# Patient Record
Sex: Female | Born: 1935 | Race: White | Hispanic: No | Smoking: Never smoker
Health system: Southern US, Community
[De-identification: ages and names within clinical notes are randomized; demographics above are authoritative.]

## PROBLEM LIST (undated history)

## (undated) DIAGNOSIS — I10 Essential (primary) hypertension: Secondary | ICD-10-CM

## (undated) DIAGNOSIS — C50919 Malignant neoplasm of unspecified site of unspecified female breast: Secondary | ICD-10-CM

## (undated) DIAGNOSIS — E785 Hyperlipidemia, unspecified: Secondary | ICD-10-CM

## (undated) DIAGNOSIS — E039 Hypothyroidism, unspecified: Secondary | ICD-10-CM

## (undated) DIAGNOSIS — C8590 Non-Hodgkin lymphoma, unspecified, unspecified site: Secondary | ICD-10-CM

## (undated) DIAGNOSIS — E119 Type 2 diabetes mellitus without complications: Secondary | ICD-10-CM

## (undated) DIAGNOSIS — K219 Gastro-esophageal reflux disease without esophagitis: Secondary | ICD-10-CM

## (undated) HISTORY — DX: Hypothyroidism, unspecified: E03.9

## (undated) HISTORY — PX: APPENDECTOMY: SHX54

## (undated) HISTORY — PX: CHOLECYSTECTOMY: SHX55

## (undated) HISTORY — DX: Essential (primary) hypertension: I10

## (undated) HISTORY — DX: Non-Hodgkin lymphoma, unspecified, unspecified site: C85.90

## (undated) HISTORY — PX: MASTECTOMY: SHX3

## (undated) HISTORY — DX: Type 2 diabetes mellitus without complications: E11.9

## (undated) HISTORY — DX: Malignant neoplasm of unspecified site of unspecified female breast: C50.919

## (undated) HISTORY — DX: Hyperlipidemia, unspecified: E78.5

## (undated) HISTORY — DX: Gastro-esophageal reflux disease without esophagitis: K21.9

## (undated) HISTORY — PX: TONSILLECTOMY: SUR1361

---

## 1998-03-14 ENCOUNTER — Other Ambulatory Visit: Admission: RE | Admit: 1998-03-14 | Discharge: 1998-03-14 | Payer: Self-pay | Admitting: *Deleted

## 2000-11-08 LAB — HM COLONOSCOPY

## 2001-05-17 ENCOUNTER — Other Ambulatory Visit: Admission: RE | Admit: 2001-05-17 | Discharge: 2001-05-17 | Payer: Self-pay | Admitting: *Deleted

## 2001-06-20 ENCOUNTER — Other Ambulatory Visit: Admission: RE | Admit: 2001-06-20 | Discharge: 2001-06-20 | Payer: Self-pay | Admitting: Internal Medicine

## 2001-06-20 ENCOUNTER — Encounter (INDEPENDENT_AMBULATORY_CARE_PROVIDER_SITE_OTHER): Payer: Self-pay | Admitting: Specialist

## 2002-07-17 ENCOUNTER — Encounter: Payer: Self-pay | Admitting: Urology

## 2002-07-18 ENCOUNTER — Inpatient Hospital Stay (HOSPITAL_COMMUNITY): Admission: EM | Admit: 2002-07-18 | Discharge: 2002-07-19 | Payer: Self-pay | Admitting: Urology

## 2002-11-26 ENCOUNTER — Ambulatory Visit (HOSPITAL_COMMUNITY): Admission: RE | Admit: 2002-11-26 | Discharge: 2002-11-26 | Payer: Self-pay | Admitting: Oncology

## 2002-11-26 ENCOUNTER — Encounter (HOSPITAL_COMMUNITY): Payer: Self-pay | Admitting: Oncology

## 2003-03-14 ENCOUNTER — Ambulatory Visit (HOSPITAL_COMMUNITY): Admission: RE | Admit: 2003-03-14 | Discharge: 2003-03-14 | Payer: Self-pay | Admitting: Ophthalmology

## 2005-12-07 ENCOUNTER — Ambulatory Visit (HOSPITAL_COMMUNITY): Admission: RE | Admit: 2005-12-07 | Discharge: 2005-12-07 | Payer: Self-pay | Admitting: Internal Medicine

## 2006-04-14 ENCOUNTER — Ambulatory Visit: Payer: Self-pay

## 2006-04-21 ENCOUNTER — Ambulatory Visit (HOSPITAL_COMMUNITY): Admission: RE | Admit: 2006-04-21 | Discharge: 2006-04-21 | Payer: Self-pay | Admitting: Internal Medicine

## 2006-04-29 ENCOUNTER — Ambulatory Visit (HOSPITAL_COMMUNITY): Admission: RE | Admit: 2006-04-29 | Discharge: 2006-04-29 | Payer: Self-pay | Admitting: Internal Medicine

## 2006-10-17 ENCOUNTER — Ambulatory Visit (HOSPITAL_COMMUNITY): Admission: RE | Admit: 2006-10-17 | Discharge: 2006-10-17 | Payer: Self-pay | Admitting: Internal Medicine

## 2007-02-23 ENCOUNTER — Inpatient Hospital Stay (HOSPITAL_COMMUNITY): Admission: EM | Admit: 2007-02-23 | Discharge: 2007-02-26 | Payer: Self-pay | Admitting: Emergency Medicine

## 2007-02-23 ENCOUNTER — Encounter (INDEPENDENT_AMBULATORY_CARE_PROVIDER_SITE_OTHER): Payer: Self-pay | Admitting: *Deleted

## 2007-04-28 ENCOUNTER — Ambulatory Visit (HOSPITAL_COMMUNITY): Admission: RE | Admit: 2007-04-28 | Discharge: 2007-04-28 | Payer: Self-pay | Admitting: Internal Medicine

## 2008-07-19 ENCOUNTER — Ambulatory Visit (HOSPITAL_COMMUNITY): Admission: RE | Admit: 2008-07-19 | Discharge: 2008-07-19 | Payer: Self-pay | Admitting: Internal Medicine

## 2009-10-14 ENCOUNTER — Ambulatory Visit: Payer: Self-pay | Admitting: Cardiology

## 2009-10-14 ENCOUNTER — Inpatient Hospital Stay (HOSPITAL_COMMUNITY): Admission: EM | Admit: 2009-10-14 | Discharge: 2009-10-17 | Payer: Self-pay | Admitting: Emergency Medicine

## 2009-10-15 ENCOUNTER — Ambulatory Visit: Payer: Self-pay | Admitting: Vascular Surgery

## 2009-10-15 ENCOUNTER — Encounter (INDEPENDENT_AMBULATORY_CARE_PROVIDER_SITE_OTHER): Payer: Self-pay | Admitting: Internal Medicine

## 2009-11-08 LAB — HM MAMMOGRAPHY: HM Mammogram: NEGATIVE

## 2010-08-18 ENCOUNTER — Telehealth (INDEPENDENT_AMBULATORY_CARE_PROVIDER_SITE_OTHER): Payer: Self-pay | Admitting: *Deleted

## 2010-08-20 ENCOUNTER — Telehealth (INDEPENDENT_AMBULATORY_CARE_PROVIDER_SITE_OTHER): Payer: Self-pay | Admitting: *Deleted

## 2010-08-24 ENCOUNTER — Encounter: Payer: Self-pay | Admitting: Cardiology

## 2010-08-24 ENCOUNTER — Ambulatory Visit: Payer: Self-pay

## 2010-12-08 NOTE — Progress Notes (Signed)
Summary: 24 hour holter monitor  Phone Note Outgoing Call Call back at [facility] Phone [phone]   Call placed by: Stanton Kidney, EMT-P,  August 20, 2010 4:52 PM Summary of Call: No answer at home number- need to schedule 24 hour holter monitor. Stanton Kidney, EMT-P  August 18, 2010 5:15 PM

## 2010-12-08 NOTE — Progress Notes (Signed)
Summary: 24 holter monitor  Phone Note Outgoing Call Call back at [facility] Phone [phone]   Call placed by: Stanton Kidney, EMT-P,  August 20, 2010 5:01 PM Action Taken: Phone Call Completed Summary of Call: Pt scheduled for 10/17 at 9am. Stanton Kidney, EMT-P  August 20, 2010 5:01 PM

## 2010-12-08 NOTE — Procedures (Signed)
Summary: summary report  summary report   Imported By: Mirna Mires 08/26/2010 15:26:32  _____________________________________________________________________  External Attachment:    Type:   Image     Comment:   External Document

## 2011-02-09 LAB — DIFFERENTIAL
Basophils Absolute: 0.1 10*3/uL (ref 0.0–0.1)
Basophils Relative: 0 % (ref 0–1)
Eosinophils Absolute: 0 10*3/uL (ref 0.0–0.7)
Eosinophils Absolute: 0.1 10*3/uL (ref 0.0–0.7)
Eosinophils Absolute: 0.2 10*3/uL (ref 0.0–0.7)
Eosinophils Relative: 0 % (ref 0–5)
Eosinophils Relative: 2 % (ref 0–5)
Lymphocytes Relative: 19 % (ref 12–46)
Lymphs Abs: 1 10*3/uL (ref 0.7–4.0)
Monocytes Absolute: 0.4 10*3/uL (ref 0.1–1.0)
Monocytes Absolute: 0.9 10*3/uL (ref 0.1–1.0)
Monocytes Relative: 4 % (ref 3–12)
Monocytes Relative: 8 % (ref 3–12)
Neutro Abs: 10 10*3/uL — ABNORMAL HIGH (ref 1.7–7.7)
Neutro Abs: 8.4 10*3/uL — ABNORMAL HIGH (ref 1.7–7.7)
Neutrophils Relative %: 70 % (ref 43–77)

## 2011-02-09 LAB — BASIC METABOLIC PANEL
BUN: 10 mg/dL (ref 6–23)
CO2: 24 mEq/L (ref 19–32)
Chloride: 104 mEq/L (ref 96–112)
Creatinine, Ser: 1 mg/dL (ref 0.4–1.2)
GFR calc Af Amer: >60 mL/min (ref >60)
GFR calc non Af Amer: 54 mL/min — ABNORMAL LOW (ref >60)
Glucose, Bld: 112 mg/dL — ABNORMAL HIGH (ref 70–99)
Potassium: 3.2 mEq/L — ABNORMAL LOW (ref 3.5–5.1)
Potassium: 3.8 mEq/L (ref 3.5–5.1)
Sodium: 137 mEq/L (ref 135–145)

## 2011-02-09 LAB — CBC
HCT: 34.4 % — ABNORMAL LOW (ref 36.0–46.0)
HCT: 37.8 % (ref 36.0–46.0)
Hemoglobin: 12.3 g/dL (ref 12.0–15.0)
Hemoglobin: 12.7 g/dL (ref 12.0–15.0)
MCHC: 33.7 g/dL (ref 30.0–36.0)
MCV: 88.6 fL (ref 78.0–100.0)
MCV: 88.9 fL (ref 78.0–100.0)
Platelets: 357 10*3/uL (ref 150–400)
RBC: 4.1 MIL/uL (ref 3.87–5.11)
RBC: 4.25 MIL/uL (ref 3.87–5.11)
RDW: 13.5 % (ref 11.5–15.5)
WBC: 9.9 10*3/uL (ref 4.0–10.5)

## 2011-02-09 LAB — TSH: TSH: 17.72 u[IU]/mL — ABNORMAL HIGH (ref 0.350–4.500)

## 2011-02-09 LAB — CARDIAC PANEL(CRET KIN+CKTOT+MB+TROPI)
Relative Index: INVALID (ref 0.0–2.5)
Relative Index: INVALID (ref 0.0–2.5)
Troponin I: 0.01 ng/mL (ref 0.00–0.06)
Troponin I: 0.02 ng/mL (ref 0.00–0.06)

## 2011-02-09 LAB — COMPREHENSIVE METABOLIC PANEL
Alkaline Phosphatase: 47 U/L (ref 39–117)
BUN: 14 mg/dL (ref 6–23)
CO2: 27 mEq/L (ref 19–32)
Chloride: 104 mEq/L (ref 96–112)
Creatinine, Ser: 1.23 mg/dL — ABNORMAL HIGH (ref 0.4–1.2)
GFR calc non Af Amer: 42 mL/min — ABNORMAL LOW (ref >60)
Glucose, Bld: 124 mg/dL — ABNORMAL HIGH (ref 70–99)
Total Bilirubin: 0.5 mg/dL (ref 0.3–1.2)

## 2011-02-09 LAB — URINE CULTURE
Colony Count: >=100000
Special Requests: NEGATIVE

## 2011-02-09 LAB — URINALYSIS, ROUTINE W REFLEX MICROSCOPIC
Bilirubin Urine: NEGATIVE
Glucose, UA: NEGATIVE mg/dL
Hgb urine dipstick: NEGATIVE
Protein, ur: NEGATIVE mg/dL

## 2011-02-09 LAB — POCT CARDIAC MARKERS
CKMB, poc: 1.1 ng/mL (ref 1.0–8.0)
Myoglobin, poc: 112 ng/mL (ref 12–200)

## 2011-02-09 LAB — LIPID PANEL
HDL: 37 mg/dL — ABNORMAL LOW (ref >39)
Total CHOL/HDL Ratio: 4.7 RATIO

## 2011-02-09 LAB — URINE MICROSCOPIC-ADD ON

## 2011-03-26 NOTE — Op Note (Signed)
TNAME:  Janet Reeves, Janet Reeves                           ACCOUNT NO.:  [id number]   MEDICAL RECORD NO.:  [id number]                   PATIENT TYPE:  AMB   LOCATION:  [facility]                                 FACILITY:  [facility]   PHYSICIAN:  Jamison Neighbor, M.D.               DATE OF BIRTH:  02/22/1929   DATE OF PROCEDURE:  07/17/2002  DATE OF DISCHARGE:                                 OPERATIVE REPORT   PREOPERATIVE DIAGNOSES:  1. Cystocele.  2. Rectocele.  3. Stress urinary incontinence.   POSTOPERATIVE DIAGNOSES:  1. Cystocele.  2. Rectocele.  3. Stress urinary incontinence.   PROCEDURE:  1. Cystoscopy.  2. Suprapubic tube placement.  3. Anterior repair.  4. Placement of pubovaginal sling.  5. Posterior repair.   SURGEON:  Jamison Neighbor, M.D.   ASSISTANT:  Crecencio Mc, M.D.   ANESTHESIA:  General.   COMPLICATIONS:  None.   DRAINS:  A Bonanno Cystocath.   BRIEF HISTORY:  This 77 year old female has a symptomatic cystocele.  She is  bothered by the heavy sensation of prolapse.  Because of the prolapsing  cystocele, she has not had as much in the way of stress incontinence but  does have urethral hypermobility.  There is some kinking at the bladder neck  caused by the cystocele.  There is also a small rectocele which may or may  not require repair.  The patient still has a uterus intact.  It is small,  atrophic, and well-supported, and she does not wish to have this removed  unless there is a concomitant reason for hysterectomy.  The patient has  agreed to a cystocele repair, placement of a sling to further correct her  central and lateral defect, and rectocele repair if indicated.  She gave  informed consent.  She understands that she will need a suprapubic tube as  well as 23-hour observation.   DESCRIPTION OF PROCEDURE:  After the successful induction of general  anesthesia, the patient was placed in the dorsal lithotomy position, prepped  with Betadine, and draped in the  usual sterile fashion.  The patient's  vagina was carefully inspected.  No masses could be seen.  The uterus was  palpably normal and in normal position.  The patient had a modest rectocele  and a larger cystocele.  The patient had a weighted vaginal speculum placed.  The anterior vaginal mucosa was infiltrated with local anesthetic.  While  that was setting up, a small incision was made directly above the pubic  bone, and this was carried down sharply through the subcutaneous fascia  until the rectus sheath and the pubic bone could be identified.  Antibiotic-  soaked gauze was placed in that small incision.  Anterior vaginal mucosal  incision was then made, beginning at the bladder neck and extending all the  way back to the cardinal ligaments.  The posterior dissection proceeded all  the  way back to the space of Retzius which was entered, and the urethra was  mobilized.  The entire cystocele was freed from the gouging mucosa.  The  cystocele was plicated with a series of horizontal mattress sutures,  beginning up near the bladder neck and extending all the way back to the  cardinal ligaments which were identified and used for support.  The sling  was then constructed of a 4 x 7 cm piece of Tutoplast fascia.  This was  constructed into a T shape with the bottom limit of the T extending down  along the cystocele repair.  The ends of the transverse portion of the T  were oversewn with #1 nylon.  Tonsil clamps were passed from the abdominal  incision to the vaginal incision and were used to pull the sutures for the  sling up to the abdominal incision.  The sling was then positioned so that  it covered the bladder neck and extended all the way back to the cardinal  ligaments.  The sling was tacked down in multiple places with Vicryl  sutures, beginning at the bladder neck and extending down laterally and all  the way back to the cardinal ligaments.  The anterior vaginal mucosa was  then  trimmed and closed with a running suture of 2-0 Vicryl.  Cystoscopy was  performed, and the bladder was carefully inspected.  It was free of any  tumor or stones.  Both ureteral orifices were normal in configuration and  location.  No tumors or stones could be identified.  The bladder was filled  under direct vision.  A Cystocath was placed through a small stab incision,  and this was sutured in place with 3-0 nylon sutures.  The bladder was  filled.  With a full bladder and Crede maneuver, there was good flow of  urine.  The sling was tied down with an appropriate degree of tension.  The  following steps were used to ensure that there was no overcorrection:  (1)  The sling was tied down so there were at least 2 fingerbreadths between the  sling and the fascia.  (2) The cystoscope could be inserted at a straight  angle, and there was at least 30-45 degrees of play within the cystoscope  with no excessive angulation.  (3) Coaptation in the mucosa could be seen  but no excessive correction.  (4) With a full bladder, there was no loss of  urine but with Crede, there was a straight, prompt flow of urine with no  sense of obstruction.  The sling was tied down completely.  The incision was  irrigated with antibiotic solution.  It was then closed with 2-0 Vicryl, 4-0  Vicryl subcuticular, Steri-Strips, and a Tegaderm.  Attention was then  directed posteriorly.  Once the cystocele had been elevated, it was clear  that there was a good sized rectocele.  This did not extend all the way down  to the perineum where the perineal body appeared to be reasonably intact.  It was not felt that a perineorrhaphy was identified.  A simple site-  specific rectocele repair was undertaken.  The anterior vaginal mucosa was  infiltrated and then was opened in the midline, beginning just inside the  introitus and extending all the way back towards the uterus.  Flaps were raised bilaterally, extending all the way down  to the fascia, and the fascia  was then closed in the midline with a series of interrupted figure-of-eight  sutures.  The mucosa was then trimmed slightly and was closed with a running  suture of 2-0 Vicryl.  At the end of the procedure, the cystocele and  rectocele were seen to have been nicely reduced.  Rectal examination with a  fresh glove showed that there was good support for the rectum.  The vagina  does narrow down somewhat posterior, but there is plenty of length in case  the patient remains sexually active.  The uterus is unremarkable and can be  left alone.  The patient had vaginal pack applied.  The suprapubic tube was  placed to straight drainage.  The patient tolerated the procedure well and  was taken to the recovery room in good condition.  She will admitted for 23-  hour observation and will be sent home tomorrow with training with the  suprapubic tube.  She will be sent home with Cipro and Lorcet Plus.  She  will return to my office in one week's time for removal of the SP tube,  assuming that she is voiding satisfactorily.                                               Jamison Neighbor, M.D.    RJE/MEDQ  D:  07/17/2002  T:  07/17/2002  Job:  [id number]   cc:   Marinus Maw, M.D.

## 2011-03-26 NOTE — Op Note (Signed)
NAME:  Janet Reeves, Janet Reeves                            ACCOUNT NO.:  [id number]   MEDICAL RECORD NO.:  [id number]                   PATIENT TYPE:  OIB   LOCATION:  2899                                 FACILITY:  [facility]   PHYSICIAN:  Robert Reeves. Dione Booze, M.D.               DATE OF BIRTH:  Oct 20, 1929   DATE OF PROCEDURE:  03/14/2003  DATE OF DISCHARGE:  03/14/2003                                 OPERATIVE REPORT   INDICATIONS AND JUSTIFICATION FOR THE PROCEDURE:  Janet Reeves was seen  recently in my office on February 12, 2003 with 20/25 vision and previous  cataract surgery with lens implants.  Pressures were 9 and her vision was  good, but she did notice that superior field vision was reduced and she  complained that her eyelids drooped over and this was a longstanding problem  but was getting worse.  She could feel the weight of the lids and when she  lifted them up, she felt she could see better.  The problem was confirmed  with visual field testing and photographs, and indeed she does have a  significant loss of vision when the skin of the lid is taped compared to  when it is not taped.  The margin reflex distance is 2 mm on each side.  The  pupils, motility, conjunctivae, cornea, anterior chamber, and fundus exam  were performed, and she does have lens implants in place.  Externally she  has redundant skin that covers the lid margins and comes close to the upper  margin of each pupil.  Actually when the lids were relaxed, the margin  reflex distance was 0 in some of the photographs.  The problem was discussed  and it was decided to go ahead and have upper eyelid blepharoplasties in  order to alleviate the problem that she was having with the skin.  Medically, she should be stable for this.  Justification for the procedure in the outpatient setting is routine  justification.  Overnight stay - none.   PREOPERATIVE DIAGNOSIS:  Moderate dermatochalasis with redundant skin of  each upper eyelid.   POSTOPERATIVE DIAGNOSIS:  Moderate dermatochalasis with redundant skin of  each upper eyelid.   OPERATION/PROCEDURE:  Upper eyelid blepharoplasty.   SURGEON:  Robert Reeves. Dione Booze, M.D.   ANESTHESIA:  1% Xylocaine with epinephrine.   DESCRIPTION OF PROCEDURE:  The patient arrived in the minor surgery room at  [facility]  and was prepped and draped in the routine fashion.  The  skin to be removed was carefully demarcated and excised on each side after  1% Xylocaine with epinephrine had been given.  The skin was excised along  with underlying fatty tissue and each wound was closed with a running 6-0  nylon suture.  Pressure patches were applied and the patient left the minor  room having done well.    FOLLOWUP CARE:  The  patient will be seen in my office in five or six days to  have the sutures removed.  She is to use warm compresses twice daily and  Polysporin ointment in her eyes at night.                                               Robert Reeves. Dione Booze, M.D.    RLG/MEDQ  D:  03/14/2003  T:  03/15/2003  Job:  [id number]   cc:   Lucky Cowboy, M.D.  [address]  [place], [place] [address]  Fax: [phone]

## 2011-03-26 NOTE — Discharge Summary (Signed)
Janet Reeves, Janet Reeves                  ACCOUNT NO.:  [id number]   MEDICAL RECORD NO.:  [id number]          PATIENT TYPE:  INP   LOCATION:  3730                         FACILITY:  [facility]   PHYSICIAN:  Theresia Bough, MD       DATE OF BIRTH:  10/14/1929   DATE OF ADMISSION:  02/22/2007  DATE OF DISCHARGE:  02/26/2007                               DISCHARGE SUMMARY   PRIMARY CARE PHYSICIAN:  Lucky Cowboy, M.D.   FINAL DIAGNOSES:  1. Syncope which has resolved.  2. Pyelonephritis.  The patient is currently on antibiotics.  3. Diverticulitis.  The patient is also on antibiotics.   Other past medical history includes:  1. Hypothyroidism.  2. GERD.  3. The patient has also had a history of right breast cancer in 1985.      She was treated with mastectomy and chemotherapy.  The patient was      also treated by Dr. Jon Billings for non-Hodgkin's lymphoma.   DISCHARGE MEDICATIONS:  The patient is on Synthroid 112 mcg p.o. daily.  1. Ciprofloxacin 500 mg p.o. b.i.d. for the next one week.  2. Flagyl 500 mg p.o. b.i.d. also for the next one week.   DISCHARGE CONDITION:  The patient has improved and is stable at the time  of discharge.   DISPOSITION:  The patient is going to be discharged home.   RADIOLOGY:  While in the hospital, the patient had a CT of the abdomen  and pelvis done on February 22, 2007.  CT of the abdomen findings  compatible to mild sigmoid diverticulitis.  No perforation or abscess.  MRA of the brain shows no evidence of acute ischemia.  The patient has  some mild sinus disease.  He is currently on antibiotics.  MRA of the  neck shows no occlusion or dissection.  Please refer to the full report  of the MRA of the head and neck.   Current blood work shows WBC 8.1, hemoglobin 11.7, hematocrit 34.2,  platelets of 279, sodium 141, potassium 3.5, chloride 107, bicarbonate  25, BUN 8, creatinine 0.8, and glucose 113.   BRIEF HOSPITAL COURSE:  Janet Reeves was admitted on February 23, 2007  because of a few minutes of syncope as well as abdominal pain.  CT of  the abdomen was consistent with diverticulitis.  The patient was started  on IV ciprofloxacin and IV Flagyl.  The patient continues to improve.  Urine analysis was consistent with the UTI.  This was also improved with IV ciprofloxacin.  The patient's condition  has improved at this time.  He is alert and oriented.  There is no new  medical complaints.  The patient is scheduled to be discharged home, and  she will follow up with her primary care Kaliyan Osbourn within the next one  week.      Theresia Bough, MD  Electronically Signed     GA/MEDQ  D:  02/26/2007  T:  02/26/2007  Job:  [id number]   cc:   Lucky Cowboy, M.D.

## 2011-03-26 NOTE — H&P (Signed)
NAME:  Janet Reeves, Janet Reeves                  ACCOUNT NO.:  [id number]   MEDICAL RECORD NO.:  [id number]          PATIENT TYPE:  EMS   LOCATION:  MAJO                         FACILITY:  [facility]   PHYSICIAN:  Mobolaji B. Bakare, M.D.DATE OF BIRTH:  1929-08-10   DATE OF ADMISSION:  02/22/2007  DATE OF DISCHARGE:                              HISTORY & PHYSICAL   PRIMARY CARE PHYSICIAN:  Lucky Cowboy, M.D.   CHIEF COMPLAINT:  Syncope.   HISTORY OF PRESENT ILLNESS:  Janet Reeves is a 77 year old Caucasian  female who was in her usual state of health until last night while  watching an awards ceremony for her daughter who was in [facility]  whereby she passed out for less than one minute.  The patient  had the usual greetings at the ceremony and a few sips of wine.  Sat  down at a table and all of a sudden she felt spinning sensation with the  room spinning around her and she passed out for less than one minute.  By the time she came around, she noticed someone brought some ice.  People around her said there was no convulsion or urinary incontinence.  Immediately after coming around she vomited about four times.  She is  currently complaining of headaches, but no change in her vision or  slurred speech or unilateral weakness.   The patient was brought to the emergency room.  She complained of  abdominal pain in her emergency room which was not there before she  passed out.  She had a CT scan of the abdomen which revealed minor  sigmoid diverticulitis. She also has a pyuria on urinalysis.  The  patient denies chest pain, diaphoresis or shortness of breath.   The patient has had previous episodes of passing out.  The first time  five years ago and most recently was last year while in [place], [place].  She had an extensive work-up then as per the patient's daughter's  report.  It was concluded that she was dehydrated.   REVIEW OF SYSTEMS:  Positive for dyspnea on exertion. No chest pain.   No  fever, no dysuria or increased frequency of micturition. There is no  diarrhea or constipation.  She has not vomited prior to tonight.   PAST MEDICAL HISTORY:  1. Right breast cancer in 1985.  She is status post right mastectomy      and chemotherapy.  2. She had non-Hodgkin's lymphoma in 1995, treated by Dr. Jon Billings.  3. Hypothyroidism.  4. Gastroesophageal reflux disease.   CURRENT MEDICATIONS:  Nexium, Synthroid.   ALLERGIES:  CODEINE.   FAMILY HISTORY:  Father passed away from liver cancer.  Mother passed  away from renal failure.   SOCIAL HISTORY:  The patient is a widow.  She occasionally drinks  alcohol.  She is an ex-smoker, quit 30 years ago.   PHYSICAL EXAMINATION:  VITAL SIGNS:  Temperature 98.4, blood pressure  154/63, pulse 90, respiratory rate 18, oxygen saturation 95%.  GENERAL:  On examination, the patient is awake, alert, oriented to time,  place and  person.  HEENT: Normocephalic, atraumatic.  No nystagmus.  Pupils equal, round,  and reactive to light.  Extraocular muscles intact. Mucous membranes  moist.  No oral thrush.  NECK:  No carotid bruits.  No thyromegaly.  LUNGS:  Clear.  Bibasilar rales.  No wheeze or rhonchi.  CARDIOVASCULAR:  S1 and S2 regular. Systolic ejection murmur in the  aortic area.  ABDOMEN:  Obese, soft.  There is suprapubic and left lower quadrant  tenderness.  No palpable organomegaly.  Bowel sounds present.  EXTREMITIES:  No pedal edema or calf tenderness.  Dorsalis pedis pulses  palpable bilaterally.  CNS:  No focal neurological deficit.  Motor power 5/5 in all limbs.  No  facial asymmetry.  Cranial nerves intact.   ADMISSION LABORATORY DATA:  White cell count 14,000.  Hemoglobin 13.7,  hematocrit 40.7, platelet count __________ .  Neutrophils 86%,  lymphocytes 30%.  Creatinine 1.2, sodium 127, potassium 4.2, chloride  107, glucose 146, BUN 13.  Urinalysis clear in appearance, specific  gravity 1.027.  Negative for nitrites  and small leukocytes and  microscopy showed wbc's of 21-50, rare bacteria.   Chest x-ray unavailable.  CT scan of the abdomen and pelvis showed mild  stranding about the sigmoid colon compatible with mild diverticulitis.  No perforation or abscess.  EKG is unavailable with request for one.   ASSESSMENT/PLAN:  Janet Reeves is a 77 year old independent lady who  presents with syncopal episode associated with vertigo.  She was noted  to have abdominal tenderness on initially evaluation in the emergency  room which necessitated CT scan of the abdomen.  This confirmed mild  sigmoid diverticulosis.  She also has mild pyuria.   1. Syncope.  This is associated with vertigo.  Etiology is presently      unclear.  We will admit to telemetry, cycle cardiac enzymes, obtain      2-D echocardiogram and check orthostatic blood pressure.  We will      obtain an EKG.  2. Vertigo.  We will obtain an MRI of the head, MRA head and neck to      rule out vertebrobasilar insufficiency.  3. Marked sigmoid diverticulitis.  The patient only became symptomatic      after she passed out.  Will obtain blood cultures and start her on      ciprofloxacin 400 mg q.12h., Flagyl 500 mg q.8h.  4. Probable urinary tract infection.  Urine culture has been sent.      Ciprofloxacin will cover this pending culture.  5. Hypothyroidism, will resume Synthroid.      Mobolaji B. Corky Downs, M.D.  Electronically Signed     MBB/MEDQ  D:  02/23/2007  T:  02/23/2007  Job:  [id number]   cc:   Lucky Cowboy, M.D.

## 2011-05-31 ENCOUNTER — Other Ambulatory Visit (HOSPITAL_COMMUNITY): Payer: Self-pay | Admitting: Internal Medicine

## 2011-05-31 ENCOUNTER — Ambulatory Visit (HOSPITAL_COMMUNITY)
Admission: RE | Admit: 2011-05-31 | Discharge: 2011-05-31 | Disposition: A | Discharge status: Home / Self Care | Payer: Medicare Other | Source: Ambulatory Visit | Attending: Internal Medicine | Admitting: Internal Medicine

## 2011-05-31 DIAGNOSIS — M2010 Hallux valgus (acquired), unspecified foot: Secondary | ICD-10-CM | POA: Insufficient documentation

## 2011-05-31 DIAGNOSIS — M79609 Pain in unspecified limb: Secondary | ICD-10-CM | POA: Insufficient documentation

## 2011-05-31 DIAGNOSIS — M25579 Pain in unspecified ankle and joints of unspecified foot: Secondary | ICD-10-CM | POA: Insufficient documentation

## 2013-01-29 ENCOUNTER — Encounter: Payer: Self-pay | Admitting: Cardiology

## 2013-02-27 ENCOUNTER — Ambulatory Visit (HOSPITAL_COMMUNITY)
Admission: RE | Admit: 2013-02-27 | Discharge: 2013-02-27 | Disposition: A | Discharge status: Home / Self Care | Payer: Medicare Other | Source: Ambulatory Visit | Attending: Internal Medicine | Admitting: Internal Medicine

## 2013-02-27 ENCOUNTER — Other Ambulatory Visit (HOSPITAL_COMMUNITY): Payer: Self-pay | Admitting: Internal Medicine

## 2013-02-27 DIAGNOSIS — I700 Atherosclerosis of aorta: Secondary | ICD-10-CM | POA: Insufficient documentation

## 2013-02-27 DIAGNOSIS — M899 Disorder of bone, unspecified: Secondary | ICD-10-CM | POA: Insufficient documentation

## 2013-02-27 DIAGNOSIS — K449 Diaphragmatic hernia without obstruction or gangrene: Secondary | ICD-10-CM | POA: Insufficient documentation

## 2013-02-27 DIAGNOSIS — M549 Dorsalgia, unspecified: Secondary | ICD-10-CM

## 2013-02-27 DIAGNOSIS — I517 Cardiomegaly: Secondary | ICD-10-CM | POA: Insufficient documentation

## 2013-02-27 DIAGNOSIS — IMO0002 Reserved for concepts with insufficient information to code with codable children: Secondary | ICD-10-CM | POA: Insufficient documentation

## 2013-02-27 DIAGNOSIS — M479 Spondylosis, unspecified: Secondary | ICD-10-CM | POA: Insufficient documentation

## 2013-02-27 DIAGNOSIS — Z853 Personal history of malignant neoplasm of breast: Secondary | ICD-10-CM | POA: Insufficient documentation

## 2013-03-29 ENCOUNTER — Encounter: Payer: Self-pay | Admitting: Cardiology

## 2013-03-29 ENCOUNTER — Ambulatory Visit (INDEPENDENT_AMBULATORY_CARE_PROVIDER_SITE_OTHER): Payer: Medicare Other | Admitting: Cardiology

## 2013-03-29 VITALS — BP 124/68 | HR 107 | Ht 63.0 in | Wt 180.0 lb

## 2013-03-29 DIAGNOSIS — I517 Cardiomegaly: Secondary | ICD-10-CM

## 2013-03-29 DIAGNOSIS — R55 Syncope and collapse: Secondary | ICD-10-CM | POA: Insufficient documentation

## 2013-03-29 NOTE — Assessment & Plan Note (Signed)
Patient has had intermittent syncope for years. These are typically preceded by mild diaphoresis and nausea. Her didn't sound vagal. She has not had any episodes in the past 9 months. If she has frequent episodes in the future we will consider a monitor. Echocardiogram to assess LV function. I discussed the importance of maintaining good hydration and increased sodium intake.

## 2013-03-29 NOTE — Assessment & Plan Note (Signed)
Patient also has some dyspnea on exertion. I will arrange an echocardiogram to assess LV function.

## 2013-03-29 NOTE — Progress Notes (Signed)
  HPI: 79 year old female for evaluation of cardiac enlargement noted on chest x-ray. Echocardiogram in 2010 showed normal LV function, grade 1 diastolic dysfunction and mild pulmonary hypertension. Patient does note some dyspnea on exertion but no orthopnea, PND, pedal edema, chest pain. She occasionally feels her heart race with exertion. She does have a history of syncopal episodes intermittently for approximately 15 years. These are typically preceded by mild nausea and diaphoresis. He is typically unconscious for 1 minute. She has not had one in the past 8 months. Recently noted to have cardiac enlargement on chest x-ray. We were asked to evaluate.   Current Outpatient Prescriptions  Medication Sig Dispense Refill  . aspirin 81 MG tablet Take 81 mg by mouth daily.      Marland Kitchen levothyroxine (SYNTHROID, LEVOTHROID) 100 MCG tablet Take 1 tablet by mouth daily.      . ranitidine (ZANTAC) 300 MG tablet Take 1 tablet by mouth 2 (two) times daily.       No current facility-administered medications for this visit.    Allergies  Allergen Reactions  . Codeine      Past Medical History  Diagnosis Date  . Diabetes mellitus     Diet controlled  . Hypertension   . Hypothyroid   . GERD (gastroesophageal reflux disease)   . Lymphoma     Chemotherapy  . Breast cancer     Mastectomy and chemotherapy    Past Surgical History  Procedure Laterality Date  . Mastectomy    . Tonsillectomy    . Cholecystectomy    . Appendectomy      History   Social History  . Marital Status: Widowed    Spouse Name: N/A    Number of Children: 2  . Years of Education: N/A   Occupational History  . Not on file.   Social History Main Topics  . Smoking status: Never Smoker   . Smokeless tobacco: Not on file  . Alcohol Use: No  . Drug Use: No  . Sexually Active: Not on file   Other Topics Concern  . Not on file   Social History Narrative  . No narrative on file    Family History  Problem Relation  Age of Onset  . Heart disease Father     Rhythm disturbance    ROS: some arthralgias but no fevers or chills, productive cough, hemoptysis, dysphasia, odynophagia, melena, hematochezia, dysuria, hematuria, rash, seizure activity, orthopnea, PND, pedal edema, claudication. Remaining systems are negative.  Physical Exam:   Blood pressure 124/68, pulse 107, height 5\' 3"  (1.6 m), weight 180 lb (81.647 kg).  General:  Well developed/well nourished in NAD Skin warm/dry Patient not depressed No peripheral clubbing Back-normal HEENT-normal/normal eyelids Neck supple/normal carotid upstroke bilaterally; no bruits; no JVD; no thyromegaly chest - CTA/ normal expansion CV - RRR/normal S1 and S2; no murmurs, rubs or gallops;  PMI nondisplaced Abdomen -NT/ND, no HSM, no mass, + bowel sounds, no bruit 2+ femoral pulses, no bruits Ext-no edema, chords, 2+ DP Neuro-grossly nonfocal  ECG sinus rhythm at a rate of 107. Left anterior fascicular block. Right bundle branch block.

## 2013-03-29 NOTE — Patient Instructions (Addendum)
Your physician recommends that you schedule a follow-up appointment in: AS NEEDED PENDING TEST RESULTS  Your physician has requested that you have an echocardiogram. Echocardiography is a painless test that uses sound waves to create images of your heart. It provides your doctor with information about the size and shape of your heart and how well your heart's chambers and valves are working. This procedure takes approximately one hour. There are no restrictions for this procedure.    

## 2013-04-11 ENCOUNTER — Ambulatory Visit (HOSPITAL_COMMUNITY): Payer: Medicare Other | Attending: Cardiology

## 2013-04-11 DIAGNOSIS — R0989 Other specified symptoms and signs involving the circulatory and respiratory systems: Secondary | ICD-10-CM | POA: Insufficient documentation

## 2013-04-11 DIAGNOSIS — R0602 Shortness of breath: Secondary | ICD-10-CM

## 2013-04-11 DIAGNOSIS — R0609 Other forms of dyspnea: Secondary | ICD-10-CM | POA: Insufficient documentation

## 2013-04-11 DIAGNOSIS — I517 Cardiomegaly: Secondary | ICD-10-CM | POA: Insufficient documentation

## 2013-04-11 NOTE — Progress Notes (Signed)
Echocardiogram performed.  

## 2013-04-13 ENCOUNTER — Telehealth: Payer: Self-pay | Admitting: Cardiology

## 2013-04-13 NOTE — Telephone Encounter (Signed)
Called patient with echo results. Advised will ask Dr.Crenshaw if he needs to see her again and call her back.

## 2013-04-13 NOTE — Telephone Encounter (Signed)
New Problem  Pt is calling to get the results of her Echo that she had on Wednesday.

## 2013-04-13 NOTE — Telephone Encounter (Signed)
FU only as needed. Janet Reeves

## 2013-04-17 NOTE — Telephone Encounter (Signed)
Pt is aware that MD recommends to F/U only as needed,

## 2013-07-05 ENCOUNTER — Other Ambulatory Visit: Payer: Self-pay | Admitting: Internal Medicine

## 2013-07-05 DIAGNOSIS — R109 Unspecified abdominal pain: Secondary | ICD-10-CM

## 2013-07-12 ENCOUNTER — Ambulatory Visit
Admission: RE | Admit: 2013-07-12 | Discharge: 2013-07-12 | Disposition: A | Discharge status: Home / Self Care | Payer: Medicare Other | Source: Ambulatory Visit | Attending: Internal Medicine | Admitting: Internal Medicine

## 2013-07-12 DIAGNOSIS — R109 Unspecified abdominal pain: Secondary | ICD-10-CM

## 2013-07-12 MED ORDER — IOHEXOL 300 MG/ML  SOLN
100.0000 mL | Freq: Once | INTRAMUSCULAR | Status: AC | PRN
  Administered 2013-07-12: 100 mL via INTRAVENOUS

## 2013-07-24 ENCOUNTER — Other Ambulatory Visit: Payer: Self-pay | Admitting: Internal Medicine

## 2013-07-24 DIAGNOSIS — N858 Other specified noninflammatory disorders of uterus: Secondary | ICD-10-CM

## 2013-07-24 DIAGNOSIS — R9389 Abnormal findings on diagnostic imaging of other specified body structures: Secondary | ICD-10-CM

## 2013-08-13 ENCOUNTER — Ambulatory Visit
Admission: RE | Admit: 2013-08-13 | Discharge: 2013-08-13 | Disposition: A | Discharge status: Home / Self Care | Payer: Medicare Other | Source: Ambulatory Visit | Attending: Internal Medicine | Admitting: Internal Medicine

## 2013-08-13 DIAGNOSIS — N858 Other specified noninflammatory disorders of uterus: Secondary | ICD-10-CM

## 2013-08-13 DIAGNOSIS — R9389 Abnormal findings on diagnostic imaging of other specified body structures: Secondary | ICD-10-CM

## 2013-09-28 ENCOUNTER — Encounter: Payer: Self-pay | Admitting: Internal Medicine

## 2013-09-30 DIAGNOSIS — I10 Essential (primary) hypertension: Secondary | ICD-10-CM | POA: Insufficient documentation

## 2013-10-01 ENCOUNTER — Ambulatory Visit: Payer: Medicare Other | Admitting: Internal Medicine

## 2013-10-01 ENCOUNTER — Encounter: Payer: Self-pay | Admitting: Internal Medicine

## 2013-10-01 VITALS — BP 126/86 | HR 86 | Temp 98.1°F | Resp 20 | Wt 184.6 lb

## 2013-10-01 DIAGNOSIS — Z79899 Other long term (current) drug therapy: Secondary | ICD-10-CM

## 2013-10-01 DIAGNOSIS — R7309 Other abnormal glucose: Secondary | ICD-10-CM

## 2013-10-01 DIAGNOSIS — E039 Hypothyroidism, unspecified: Secondary | ICD-10-CM

## 2013-10-01 DIAGNOSIS — C8590 Non-Hodgkin lymphoma, unspecified, unspecified site: Secondary | ICD-10-CM | POA: Insufficient documentation

## 2013-10-01 DIAGNOSIS — C50919 Malignant neoplasm of unspecified site of unspecified female breast: Secondary | ICD-10-CM | POA: Insufficient documentation

## 2013-10-01 DIAGNOSIS — I10 Essential (primary) hypertension: Secondary | ICD-10-CM

## 2013-10-01 DIAGNOSIS — E782 Mixed hyperlipidemia: Secondary | ICD-10-CM

## 2013-10-01 DIAGNOSIS — R7303 Prediabetes: Secondary | ICD-10-CM | POA: Insufficient documentation

## 2013-10-01 DIAGNOSIS — E559 Vitamin D deficiency, unspecified: Secondary | ICD-10-CM | POA: Insufficient documentation

## 2013-10-01 LAB — CBC WITH DIFFERENTIAL/PLATELET
Basophils Absolute: 0.1 10*3/uL (ref 0.0–0.1)
Basophils Relative: 1 % (ref 0–1)
Eosinophils Absolute: 0.1 10*3/uL (ref 0.0–0.7)
MCH: 29 pg (ref 26.0–34.0)
MCHC: 33.9 g/dL (ref 30.0–36.0)
Neutrophils Relative %: 65 % (ref 43–77)
Platelets: 372 10*3/uL (ref 150–400)
RBC: 4.52 MIL/uL (ref 3.87–5.11)

## 2013-10-01 LAB — LIPID PANEL
HDL: 55 mg/dL (ref >39)
Total CHOL/HDL Ratio: 4 Ratio

## 2013-10-01 LAB — HEPATIC FUNCTION PANEL
ALT: 11 U/L (ref 0–35)
Bilirubin, Direct: 0.1 mg/dL (ref 0.0–0.3)
Total Bilirubin: 0.5 mg/dL (ref 0.3–1.2)

## 2013-10-01 LAB — BASIC METABOLIC PANEL WITH GFR
Calcium: 9.8 mg/dL (ref 8.4–10.5)
GFR, Est African American: 52 mL/min — ABNORMAL LOW
GFR, Est Non African American: 45 mL/min — ABNORMAL LOW
Sodium: 141 mEq/L (ref 135–145)

## 2013-10-01 LAB — MAGNESIUM: Magnesium: 2 mg/dL (ref 1.5–2.5)

## 2013-10-01 LAB — TSH: TSH: 0.341 u[IU]/mL — ABNORMAL LOW (ref 0.350–4.500)

## 2013-10-01 NOTE — Patient Instructions (Signed)

## 2013-10-01 NOTE — Progress Notes (Signed)
Patient ID: Janet Reeves, female   DOB: 1929/05/13, 79 y.o.   MRN: [id number]   This very nice 79 yo WWFpresents for 3 month follow up with hypertension, hyperlipidemia, pre-diabetes and vitamin D deficiency.    BP has been controlled at home. Today's BP is 1`26/86 qat goal. Patient denies any cardiac type chest pain, palpitations, dyspnea/orthopnea/PND, dizziness, claudication, or dependent edema. She has seen Dr Jens Som in th e past for a syncope work-up.   Hyperlipidemia is controlled with diet & meds. Last cholesterol was  Elevated at 240, Triglycerides were 244 likewise high, and LDL 133 all above goal in August and patient was prescribed Atorvastatin bu she never picked up her prescription. Patient denies myalgias or other med SE's.    Also, the patient has history of prediabetes with last A1c of 5.8% and elevated insulin 44 in August (nl <28). Patient denies any symptoms of reactive hypoglycemia, diabetic polys, paresthesias or visual blurring.   Further, Patient has history of vitamin D deficiency with last vitamin D of 51. Patient supplements vitamin D without any suspected side-effects.  Current Outpatient Prescriptions on File Prior to Visit  Medication Sig Dispense Refill  . aspirin 81 MG tablet Take 81 mg by mouth daily.      . cholecalciferol (VITAMIN D) 1000 UNITS tablet Take 1,000 Units by mouth daily. Take 5000 IU daily.      Marland Kitchen levothyroxine (SYNTHROID, LEVOTHROID) 100 MCG tablet Take 1 tablet by mouth daily.      . magnesium gluconate (MAGONATE) 500 MG tablet Take 500 mg by mouth 2 (two) times daily.      . ranitidine (ZANTAC) 300 MG tablet Take 1 tablet by mouth 2 (two) times daily.         Allergies  Allergen Reactions  . Codeine   . Macrobid [Nitrofurantoin Macrocrystal]     Dizziness.    PMHx:   Past Medical History  Diagnosis Date  . Diabetes mellitus     Diet controlled  . Hypertension   . Hypothyroid   . GERD (gastroesophageal reflux disease)   . Lymphoma     Chemotherapy  . Breast cancer     Mastectomy and chemotherapy  . Hyperlipidemia     FHx:    Reviewed / unchanged  SHx:    Reviewed / unchanged  Systems Review: Constitutional: Denies fever, chills, wt changes, headaches, insomnia, fatigue, night sweats, change in appetite. Eyes: Denies redness, blurred vision, diplopia, discharge, itchy, watery eyes.  ENT: Denies discharge, congestion, post nasal drip, epistaxis, sore throat, earache, hearing loss, dental pain, tinnitus, vertigo, sinus pain, snoring.  CV: Denies chest pain, palpitations, irregular heartbeat, syncope, dyspnea, diaphoresis, orthopnea, PND, claudication, edema. Respiratory: denies cough, dyspnea, DOE, pleurisy, hoarseness, laryngitis, wheezing.  Gastrointestinal: Denies dysphagia, odynophagia, heartburn, reflux, water brash, abdominal pain or cramps, nausea, vomiting, bloating, diarrhea, constipation, hematemesis, melena, hematochezia,  Hemorrhoids. Genitourinary: Denies dysuria, frequency, urgency, nocturia, hesitancy, discharge, hematuria, flank pain. Musculoskeletal: Denies arthralgias, myalgias, stiffness, jt. swelling, pain, limp, strain/sprain.  Skin: Denies pruritus, rash, hives, warts, acne, eczema, change in skin lesion(s). Neuro: No weakness, tremor, incoordination, spasms, paresthesia, or pain. Psychiatric: Denies confusion, memory loss, or sensory loss. Endo: Denies change in weight, skin, hair change.  Heme/Lymph: No excessive bleeding, bruising, orenlarged lymph nodes.  Filed Vitals:   10/01/13 1045  BP: 126/86  Pulse: 86  Temp: 98.1 F (36.7 C)  Resp: 20    Estimated body mass index is 32.71 kg/(m^2) as calculated from the following:   Height  as of 03/29/13: 5\' 3"  (1.6 m).   Weight as of this encounter: 184 lb 9.6 oz (83.734 kg).  On Exam: Appears well nourished - in no distress. Eyes: PERRLA, EOMs, conjunctiva no swelling or erythema. Sinuses: No frontal/maxillary tenderness ENT/Mouth: EAC's  clear, TM's nl w/o erythema, bulging. Nares clear w/o erythema, swelling, exudates. Oropharynx clear without erythema or exudates. Oral hygiene is good. Tongue normal, non obstructing. Hearing intact.  Neck: Supple. Thyroid nl. Car 2+/2+ without bruits, nodes or JVD. Chest: Respirations nl with BS clear & equal w/o rales, rhonchi, wheezing or stridor.  Cor: Heart sounds normal w/ regular rate and rhythm without sig. murmurs, gallops, clicks, or rubs. Peripheral pulses normal and equal  without edema.  Abdomen: Soft & bowel sounds normal. Non-tender w/o guarding, rebound, hernias, masses, or organomegaly.  Lymphatics: Unremarkable.  Musculoskeletal: Full ROM all peripheral extremities, joint stability, 5/5 strength, and normal gait.  Skin: Warm, dry without exposed rashes, lesions, ecchymosis apparent.  Neuro: Cranial nerves intact, reflexes equal bilaterally. Sensory-motor testing grossly intact. Tendon reflexes grossly intact.  Pysch: Alert & oriented x 3. Insight and judgement nl & appropriate. No ideations.  Assessment and Plan:  1. Hypertension - Continue monitor blood pressure at home. Continue diet/meds same.  2. Hyperlipidemia - Continue diet and recommend she fill Rx for meds if so indicated by today's labs, exercise,& lifestyle modifications. Continue monitor periodic cholesterol/liver & renal functions   3. Pre-diabetes - Continue diet, exercise, lifestyle modifications. Monitor appropriate labs.  4. Vitamin D Deficiency - Continue supplementation.  5. Hypothyroidism - continue dose same pending labs  Further disposition pending results of labs.

## 2013-10-02 LAB — VITAMIN D 25 HYDROXY (VIT D DEFICIENCY, FRACTURES): Vit D, 25-Hydroxy: 57 ng/mL (ref 30–89)

## 2013-10-08 ENCOUNTER — Ambulatory Visit (INDEPENDENT_AMBULATORY_CARE_PROVIDER_SITE_OTHER): Payer: Medicare Other | Admitting: Emergency Medicine

## 2013-10-08 ENCOUNTER — Encounter: Payer: Self-pay | Admitting: Emergency Medicine

## 2013-10-08 VITALS — BP 106/62 | HR 60 | Temp 98.0°F | Resp 16 | Ht 63.5 in | Wt 182.0 lb

## 2013-10-08 DIAGNOSIS — M76899 Other specified enthesopathies of unspecified lower limb, excluding foot: Secondary | ICD-10-CM

## 2013-10-08 DIAGNOSIS — M7061 Trochanteric bursitis, right hip: Secondary | ICD-10-CM

## 2013-10-08 MED ORDER — PREDNISONE 10 MG PO TABS: ORAL_TABLET | ORAL | Status: DC

## 2013-10-08 NOTE — Progress Notes (Addendum)
   Subjective:    Patient ID: Janet Reeves, female    DOB: 02-Jan-1929, 79 y.o.   MRN: [id number]  HPI Comments: 79 yo female presents with right lateral thigh pain x >1 week with out specific trauma. Patient has had increased activity or last couple of weeks, but denies strains or long trips. Pain worse with activity, heat helps alleviate symptoms temporarily. No OTC.   Current Outpatient Prescriptions on File Prior to Visit  Medication Sig Dispense Refill  . aspirin 81 MG tablet Take 81 mg by mouth daily.      . cholecalciferol (VITAMIN D) 1000 UNITS tablet Take 1,000 Units by mouth daily. Take 5000 IU daily.      Marland Kitchen levothyroxine (SYNTHROID, LEVOTHROID) 100 MCG tablet Take 1 tablet by mouth daily.      . magnesium gluconate (MAGONATE) 500 MG tablet Take 500 mg by mouth 2 (two) times daily.      . Omega-3 Fatty Acids (FISH OIL) 1000 MG CAPS Take by mouth.      . ranitidine (ZANTAC) 300 MG tablet Take 1 tablet by mouth 2 (two) times daily.       No current facility-administered medications on file prior to visit.   ALLERGIES Codeine and Macrobid  Past Medical History  Diagnosis Date  . Diabetes mellitus     Diet controlled  . Hypertension   . Hypothyroid   . GERD (gastroesophageal reflux disease)   . Lymphoma     Chemotherapy  . Breast cancer     Mastectomy and chemotherapy  . Hyperlipidemia     Review of Systems  Musculoskeletal: Positive for myalgias.  All other systems reviewed and are negative.  BP 106/62  Pulse 60  Temp(Src) 98 F (36.7 C) (Temporal)  Resp 16  Ht 5' 3.5" (1.613 m)  Wt 182 lb (82.555 kg)  BMI 31.73 kg/m2      Objective:   Physical Exam  Nursing note and vitals reviewed. Constitutional: She is oriented to person, place, and time. She appears well-developed and well-nourished.  Cardiovascular: Normal rate, regular rhythm, normal heart sounds and intact distal pulses.   Pulmonary/Chest: Effort normal and breath sounds normal.  Musculoskeletal:  Normal range of motion. She exhibits tenderness.  Right lateral trochanter tenderness  Neurological: She is alert and oriented to person, place, and time.  Skin: Skin is warm and dry.  Psychiatric: She has a normal mood and affect.          Assessment & Plan:  Probable trochanteric bursitis. Heat, stretch, ice. Pred DP AD. If no improvement may need XRAY vs. Injection. W/C with results.

## 2013-10-08 NOTE — Patient Instructions (Signed)
Hip Bursitis °Bursitis is a puffiness (swelling) and soreness of a fluid-filled sac (bursa). This sac covers and protects the joint. °HOME CARE °· Put ice on the injured area. °· Put ice in a plastic bag. °· Place a towel between your skin and the bag. °· Leave the ice on for 15-20 minutes, 03-04 times a day. °· Rest the painful joint as much as possible. Move your joint at least 4 times a day. When pain lessens, start normal, slow movements and normal activities. °· Only take medicine as told by your doctor. °· Use crutches as told. °· Raise (elevate) your painful joint. Use pillows for propping your legs and hips. °· Get a massage to lessen pain. °GET HELP RIGHT AWAY IF: °· Your pain increases or does not improve during treatment. °· You have a fever. °· You feel heat coming from the affected area. °· You see redness and puffiness around the affected area. °· You have any questions or concerns. °MAKE SURE YOU: °· Understand these instructions. °· Will watch your condition. °· Will get help right away if you are not well or get worse. °Document Released: 11/27/2010 Document Revised: 01/17/2012 Document Reviewed: 11/27/2010 °[employer]® Patient Information ©2014 [employer]. ° °

## 2013-12-03 ENCOUNTER — Telehealth: Payer: Self-pay | Admitting: *Deleted

## 2013-12-03 NOTE — Telephone Encounter (Signed)
Patient called. States she has head cold with sneezing and congestion. Per Dr Melford Aase, try dayquil and nyquil.  Patient aware.

## 2014-01-02 ENCOUNTER — Ambulatory Visit: Payer: Self-pay | Admitting: Emergency Medicine

## 2014-01-10 ENCOUNTER — Ambulatory Visit: Payer: Self-pay | Admitting: Emergency Medicine

## 2014-02-12 ENCOUNTER — Ambulatory Visit (INDEPENDENT_AMBULATORY_CARE_PROVIDER_SITE_OTHER): Payer: Medicare Other | Admitting: Physician Assistant

## 2014-02-12 ENCOUNTER — Encounter: Payer: Self-pay | Admitting: Physician Assistant

## 2014-02-12 VITALS — BP 118/60 | HR 80 | Temp 98.2°F | Resp 16 | Wt 183.0 lb

## 2014-02-12 DIAGNOSIS — J0100 Acute maxillary sinusitis, unspecified: Secondary | ICD-10-CM

## 2014-02-12 MED ORDER — AZITHROMYCIN 250 MG PO TABS
ORAL_TABLET | ORAL | Status: DC
Start: 2014-02-12 — End: 2014-03-29

## 2014-02-12 MED ORDER — PREDNISONE 20 MG PO TABS: ORAL_TABLET | ORAL | Status: DC

## 2014-02-12 MED ORDER — PROMETHAZINE-DM 6.25-15 MG/5ML PO SYRP: 5.0000 mL | ORAL_SOLUTION | Freq: Four times a day (QID) | ORAL | Status: DC | PRN

## 2014-02-12 NOTE — Progress Notes (Signed)
   Subjective:    Patient ID: Janet Reeves, female    DOB: 1929-09-28, 80 y.o.   MRN: [id number]  Cough This is a new problem. Episode onset: 1 week. The problem has been gradually worsening. The problem occurs constantly. The cough is non-productive. Associated symptoms include headaches, nasal congestion, postnasal drip, rhinorrhea and a sore throat. Pertinent negatives include no chest pain, chills, ear congestion, ear pain, fever, heartburn, hemoptysis, myalgias, rash, shortness of breath, sweats, weight loss or wheezing. Treatments tried: aleve, allergy pill. The treatment provided no relief.    Review of Systems  Constitutional: Positive for fatigue. Negative for fever, chills and weight loss.  HENT: Positive for congestion, postnasal drip, rhinorrhea, sinus pressure and sore throat. Negative for dental problem, ear discharge, ear pain, nosebleeds, trouble swallowing and voice change.   Respiratory: Positive for cough and chest tightness. Negative for hemoptysis, shortness of breath and wheezing.   Cardiovascular: Negative.  Negative for chest pain.  Gastrointestinal: Negative.  Negative for heartburn.  Genitourinary: Negative.   Musculoskeletal: Negative.  Negative for myalgias.  Skin: Negative for rash.  Neurological: Positive for headaches.       Objective:   Physical Exam  Constitutional: She appears well-developed and well-nourished.  HENT:  Head: Normocephalic and atraumatic.  Right Ear: External ear normal.  Nose: Right sinus exhibits maxillary sinus tenderness. Right sinus exhibits no frontal sinus tenderness. Left sinus exhibits maxillary sinus tenderness. Left sinus exhibits no frontal sinus tenderness.  Eyes: Conjunctivae and EOM are normal.  Neck: Normal range of motion. Neck supple.  Cardiovascular: Normal rate, regular rhythm, normal heart sounds and intact distal pulses.   Pulmonary/Chest: Effort normal and breath sounds normal. No respiratory distress. She has no  wheezes.  Abdominal: Soft. Bowel sounds are normal.  Lymphadenopathy:    She has cervical adenopathy.  Skin: Skin is warm and dry.       Assessment & Plan:  Acute maxillary sinusitis - Plan: azithromycin (ZITHROMAX) 250 MG tablet, predniSONE (DELTASONE) 20 MG tablet, promethazine-dextromethorphan (PROMETHAZINE-DM) 6.25-15 MG/5ML syrup

## 2014-02-12 NOTE — Patient Instructions (Signed)

## 2014-03-29 ENCOUNTER — Encounter: Payer: Self-pay | Admitting: Internal Medicine

## 2014-03-29 DIAGNOSIS — Z79899 Other long term (current) drug therapy: Secondary | ICD-10-CM | POA: Insufficient documentation

## 2014-03-29 DIAGNOSIS — K219 Gastro-esophageal reflux disease without esophagitis: Secondary | ICD-10-CM | POA: Insufficient documentation

## 2014-03-29 NOTE — Patient Instructions (Signed)

## 2014-03-29 NOTE — Progress Notes (Signed)
Patient ID: Janet Reeves, female   DOB: 06-30-1929, 80 y.o.   MRN: [id number]              Janet Reeves

## 2014-04-09 ENCOUNTER — Encounter: Payer: Self-pay | Admitting: Internal Medicine

## 2014-07-04 ENCOUNTER — Encounter: Payer: Self-pay | Admitting: Internal Medicine

## 2014-07-04 ENCOUNTER — Other Ambulatory Visit: Payer: Self-pay | Admitting: Internal Medicine

## 2014-07-04 ENCOUNTER — Ambulatory Visit (INDEPENDENT_AMBULATORY_CARE_PROVIDER_SITE_OTHER): Payer: Medicare Other | Admitting: Internal Medicine

## 2014-07-04 VITALS — BP 136/88 | HR 100 | Temp 97.7°F | Resp 16 | Ht 63.0 in | Wt 184.6 lb

## 2014-07-04 DIAGNOSIS — I10 Essential (primary) hypertension: Secondary | ICD-10-CM

## 2014-07-04 DIAGNOSIS — E559 Vitamin D deficiency, unspecified: Secondary | ICD-10-CM

## 2014-07-04 DIAGNOSIS — Z1212 Encounter for screening for malignant neoplasm of rectum: Secondary | ICD-10-CM

## 2014-07-04 DIAGNOSIS — E782 Mixed hyperlipidemia: Secondary | ICD-10-CM

## 2014-07-04 DIAGNOSIS — Z79899 Other long term (current) drug therapy: Secondary | ICD-10-CM

## 2014-07-04 DIAGNOSIS — Z0000 Encounter for general adult medical examination without abnormal findings: Secondary | ICD-10-CM

## 2014-07-04 DIAGNOSIS — Z1331 Encounter for screening for depression: Secondary | ICD-10-CM

## 2014-07-04 DIAGNOSIS — Z789 Other specified health status: Secondary | ICD-10-CM

## 2014-07-04 DIAGNOSIS — R7309 Other abnormal glucose: Secondary | ICD-10-CM

## 2014-07-04 LAB — CBC WITH DIFFERENTIAL/PLATELET
BASOS ABS: 0.1 10*3/uL (ref 0.0–0.1)
Basophils Relative: 1 % (ref 0–1)
Eosinophils Absolute: 0.2 10*3/uL (ref 0.0–0.7)
Eosinophils Relative: 2 % (ref 0–5)
HCT: 42.3 % (ref 36.0–46.0)
Hemoglobin: 14 g/dL (ref 12.0–15.0)
LYMPHS PCT: 25 % (ref 12–46)
Lymphs Abs: 2.2 10*3/uL (ref 0.7–4.0)
MCH: 28.4 pg (ref 26.0–34.0)
MCHC: 33.1 g/dL (ref 30.0–36.0)
MCV: 85.8 fL (ref 78.0–100.0)
Monocytes Absolute: 0.7 10*3/uL (ref 0.1–1.0)
Monocytes Relative: 8 % (ref 3–12)
NEUTROS ABS: 5.5 10*3/uL (ref 1.7–7.7)
Neutrophils Relative %: 64 % (ref 43–77)
PLATELETS: 447 10*3/uL — AB (ref 150–400)
RBC: 4.93 MIL/uL (ref 3.87–5.11)
RDW: 14.5 % (ref 11.5–15.5)
WBC: 8.6 10*3/uL (ref 4.0–10.5)

## 2014-07-04 LAB — HEMOGLOBIN A1C
Hgb A1c MFr Bld: 6.2 % — ABNORMAL HIGH (ref <5.7)
MEAN PLASMA GLUCOSE: 131 mg/dL — AB (ref <117)

## 2014-07-04 MED ORDER — MECLIZINE HCL 25 MG PO TABS: 25.0000 mg | ORAL_TABLET | Freq: Three times a day (TID) | ORAL | Status: DC | PRN

## 2014-07-04 NOTE — Patient Instructions (Signed)

## 2014-07-04 NOTE — Progress Notes (Signed)
Patient ID: Janet Reeves, female   DOB: 01-07-1929, 80 y.o.   MRN: [id number]   Annual Screening Comprehensive Examination  This very nice 80 y.o.WWF presents for complete physical.  Patient has been followed for labile  HTN, Diabetes  Prediabetes, Hyperlipidemia, and Vitamin D Deficiency.    Patient has Hx/o labile elevated BP's and has been monitored expectantly for years. Patient's BP has been controlled at home and patient denies any cardiac symptoms as chest pain, palpitations, shortness of breath, dizziness or ankle swelling. Today's BP is borderline elevated at 136/88 mmHg.    Patient's hyperlipidemia is not controlled or at goal with  diet. Last lipids were  Chol 218*; HDL Chol 55; LDL  129*; Trig 172* on 10/01/2013.   Patient has prediabetes predating since 2012 with A1c 6.1% and patient denies reactive hypoglycemic symptoms, visual blurring, diabetic polys, or paresthesias. Last A1c was  5.9% on  10/01/2013.   Finally, patient has history of Vitamin D Deficiency of 12 in 2009 and last Vitamin D was  57  on  10/01/2013.  Medication Sig  . aspirin 81 MG tablet Take 81 mg by mouth daily.  Marland Kitchen VITAMIN D 1000 UNITS tablet Take 1,000 Units by mouth daily. Take 5000 IU daily.  Marland Kitchen levothyroxine  100 MCG tablet Take 1 tablet by mouth daily.  Marland Kitchen FISH OIL 1000 MG CAPS Take by mouth.  . ranitidine  300 MG tablet Take 1 tablet by mouth 2 (two) times daily.   Allergies  Allergen Reactions  . Codeine   . Macrobid [Nitrofurantoin Macrocrystal]     Dizziness.   Past Medical History  Diagnosis Date  . Diabetes mellitus     Diet controlled  . Hypertension   . Hypothyroid   . GERD (gastroesophageal reflux disease)   . Lymphoma     Chemotherapy  . Breast cancer     Mastectomy and chemotherapy  . Hyperlipidemia    Past Surgical History  Procedure Laterality Date  . Mastectomy    . Tonsillectomy    . Cholecystectomy    . Appendectomy     Family History  Problem Relation Age of Onset  .  Heart disease Father     Rhythm disturbance  . Hypertension Father   . Cancer Mother   . Cancer Sister   . Hypertension Daughter   . Hyperlipidemia Daughter   . Hypertension Sister   . Stroke Sister    History  Substance Use Topics  . Smoking status: Never Smoker   . Smokeless tobacco: Not on file  . Alcohol Use: No    ROS Constitutional: Denies fever, chills, weight loss/gain, headaches, insomnia, fatigue, night sweats, and change in appetite. Eyes: Denies redness, blurred vision, diplopia, discharge, itchy, watery eyes.  ENT: Denies discharge, congestion, post nasal drip, epistaxis, sore throat, earache, hearing loss, dental pain, Tinnitus, Vertigo, Sinus pain, snoring.  Cardio: Denies chest pain, palpitations, irregular heartbeat, syncope, dyspnea, diaphoresis, orthopnea, PND, claudication, edema Respiratory: denies cough, dyspnea, DOE, pleurisy, hoarseness, laryngitis, wheezing.  Gastrointestinal: Denies dysphagia, heartburn, reflux, water brash, pain, cramps, nausea, vomiting, bloating, diarrhea, constipation, hematemesis, melena, hematochezia, jaundice, hemorrhoids Genitourinary: Denies dysuria, frequency, urgency, nocturia, hesitancy, discharge, hematuria, flank pain Breast: Breast lumps, nipple discharge, bleeding.  Musculoskeletal: Denies arthralgia, myalgia, stiffness, Jt. Swelling, pain, limp, and strain/sprain. Denies falls. Skin: Denies puritis, rash, hives, warts, acne, eczema, changing in skin lesion Neuro: No weakness, tremor, incoordination, spasms, paresthesia, pain Psychiatric: Denies confusion, memory loss, sensory loss. Denies Depression. Endocrine: Denies change in weight,  skin, hair change, nocturia, and paresthesia, diabetic polys, visual blurring, hyper / hypo glycemic episodes.  Heme/Lymph: No excessive bleeding, bruising, enlarged lymph nodes.  Physical Exam  BP 136/88  Pulse 100  Temp 97.7 F   Resp 16  Ht 5\' 3"    Wt 184 lb 9.6 oz   BMI  32.71  General Appearance: Well nourished and in no apparent distress. Eyes: PERRLA, EOMs, conjunctiva no swelling or erythema, normal fundi and vessels. Sinuses: No frontal/maxillary tenderness ENT/Mouth: EACs patent / TMs  nl. Nares clear without erythema, swelling, mucoid exudates. Oral hygiene is good. No erythema, swelling, or exudate. Tongue normal, non-obstructing. Tonsils not swollen or erythematous. Hearing normal.  Neck: Supple, thyroid normal. No bruits, nodes or JVD. Respiratory: Respiratory effort normal.  BS equal and clear bilateral without rales, rhonci, wheezing or stridor. Cardio: Heart sounds are normal with regular rate and rhythm and no murmurs, rubs or gallops. Peripheral pulses are normal and equal bilaterally without edema. No aortic or femoral bruits. Chest: symmetric with normal excursions and percussion. Breasts: Right is surgicall absent and Left is without lumps, nipple discharge, retractions, or fibrocystic changes.  Abdomen: Flat, soft, with bowl sounds. Nontender, no guarding, rebound, hernias, masses, or organomegaly.  Lymphatics: Non tender without lymphadenopathy.  Musculoskeletal: Full ROM all peripheral extremities, joint stability, 5/5 strength, and normal gait. Skin: Warm and dry without rashes, lesions, cyanosis, clubbing or  ecchymosis.  Neuro: Cranial nerves intact, reflexes equal bilaterally. Normal muscle tone, no cerebellar symptoms. Sensation intact.  Pysch: Awake and oriented X 3, normal affect, Insight and Judgment appropriate.   Assessment and Plan  1. Annual Screening Examination 2. Hypertension,Labile - monitoring 3. Hyperlipidemia 4. Pre Diabetes 5. Vitamin D Deficiency 6. Lymphoma, [place] (1995) 7. Hypothyroid 8. Breast Cancer, Rt (1985)  Continue prudent diet as discussed, weight control, BP monitoring, regular exercise, and medications. Discussed med's effects and SE's. Screening labs and tests as requested with regular follow-up as  recommended.

## 2014-07-05 LAB — HEPATIC FUNCTION PANEL
ALBUMIN: 4.1 g/dL (ref 3.5–5.2)
ALT: 9 U/L (ref 0–35)
AST: 16 U/L (ref 0–37)
Alkaline Phosphatase: 70 U/L (ref 39–117)
BILIRUBIN INDIRECT: 0.4 mg/dL (ref 0.2–1.2)
Bilirubin, Direct: 0.1 mg/dL (ref 0.0–0.3)
Total Bilirubin: 0.5 mg/dL (ref 0.2–1.2)
Total Protein: 6.9 g/dL (ref 6.0–8.3)

## 2014-07-05 LAB — BASIC METABOLIC PANEL WITH GFR
BUN: 18 mg/dL (ref 6–23)
CO2: 28 mEq/L (ref 19–32)
Calcium: 9.8 mg/dL (ref 8.4–10.5)
Chloride: 105 mEq/L (ref 96–112)
Creat: 1.13 mg/dL — ABNORMAL HIGH (ref 0.50–1.10)
GFR, EST NON AFRICAN AMERICAN: 45 mL/min — AB
GFR, Est African American: 52 mL/min — ABNORMAL LOW
Glucose, Bld: 101 mg/dL — ABNORMAL HIGH (ref 70–99)
POTASSIUM: 4.4 meq/L (ref 3.5–5.3)
Sodium: 141 mEq/L (ref 135–145)

## 2014-07-05 LAB — INSULIN, FASTING: INSULIN FASTING, SERUM: 22.6 u[IU]/mL — AB (ref 2.0–19.6)

## 2014-07-05 LAB — LIPID PANEL
CHOLESTEROL: 228 mg/dL — AB (ref 0–200)
HDL: 50 mg/dL (ref >39)
LDL Cholesterol: 125 mg/dL — ABNORMAL HIGH (ref 0–99)
Total CHOL/HDL Ratio: 4.6 Ratio
Triglycerides: 263 mg/dL — ABNORMAL HIGH (ref <150)
VLDL: 53 mg/dL — AB (ref 0–40)

## 2014-07-05 LAB — TSH: TSH: 2.99 u[IU]/mL (ref 0.350–4.500)

## 2014-07-05 LAB — MAGNESIUM: Magnesium: 1.8 mg/dL (ref 1.5–2.5)

## 2014-07-05 LAB — VITAMIN D 25 HYDROXY (VIT D DEFICIENCY, FRACTURES): Vit D, 25-Hydroxy: 74 ng/mL (ref 30–89)

## 2014-07-07 ENCOUNTER — Other Ambulatory Visit: Payer: Self-pay | Admitting: Internal Medicine

## 2014-08-20 ENCOUNTER — Emergency Department (HOSPITAL_COMMUNITY): Payer: Medicare Other

## 2014-08-20 ENCOUNTER — Encounter (HOSPITAL_COMMUNITY): Payer: Self-pay | Admitting: Emergency Medicine

## 2014-08-20 ENCOUNTER — Emergency Department (HOSPITAL_COMMUNITY)
Admission: EM | Admit: 2014-08-20 | Discharge: 2014-08-20 | Disposition: A | Discharge status: Home / Self Care | Payer: Medicare Other | Attending: Emergency Medicine | Admitting: Emergency Medicine

## 2014-08-20 DIAGNOSIS — E039 Hypothyroidism, unspecified: Secondary | ICD-10-CM | POA: Diagnosis not present

## 2014-08-20 DIAGNOSIS — W06XXXA Fall from bed, initial encounter: Secondary | ICD-10-CM | POA: Diagnosis not present

## 2014-08-20 DIAGNOSIS — E785 Hyperlipidemia, unspecified: Secondary | ICD-10-CM | POA: Diagnosis not present

## 2014-08-20 DIAGNOSIS — Z853 Personal history of malignant neoplasm of breast: Secondary | ICD-10-CM | POA: Diagnosis not present

## 2014-08-20 DIAGNOSIS — I10 Essential (primary) hypertension: Secondary | ICD-10-CM | POA: Insufficient documentation

## 2014-08-20 DIAGNOSIS — Z7982 Long term (current) use of aspirin: Secondary | ICD-10-CM | POA: Diagnosis not present

## 2014-08-20 DIAGNOSIS — N390 Urinary tract infection, site not specified: Secondary | ICD-10-CM | POA: Insufficient documentation

## 2014-08-20 DIAGNOSIS — W1809XA Striking against other object with subsequent fall, initial encounter: Secondary | ICD-10-CM

## 2014-08-20 DIAGNOSIS — S0990XA Unspecified injury of head, initial encounter: Secondary | ICD-10-CM | POA: Diagnosis present

## 2014-08-20 DIAGNOSIS — S0083XA Contusion of other part of head, initial encounter: Secondary | ICD-10-CM | POA: Insufficient documentation

## 2014-08-20 DIAGNOSIS — E119 Type 2 diabetes mellitus without complications: Secondary | ICD-10-CM | POA: Diagnosis not present

## 2014-08-20 DIAGNOSIS — Y9289 Other specified places as the place of occurrence of the external cause: Secondary | ICD-10-CM | POA: Insufficient documentation

## 2014-08-20 DIAGNOSIS — K219 Gastro-esophageal reflux disease without esophagitis: Secondary | ICD-10-CM | POA: Diagnosis not present

## 2014-08-20 DIAGNOSIS — Y9389 Activity, other specified: Secondary | ICD-10-CM | POA: Insufficient documentation

## 2014-08-20 DIAGNOSIS — W01198A Fall on same level from slipping, tripping and stumbling with subsequent striking against other object, initial encounter: Secondary | ICD-10-CM | POA: Diagnosis not present

## 2014-08-20 DIAGNOSIS — Z79899 Other long term (current) drug therapy: Secondary | ICD-10-CM | POA: Insufficient documentation

## 2014-08-20 LAB — I-STAT TROPONIN, ED: TROPONIN I, POC: 0 ng/mL (ref 0.00–0.08)

## 2014-08-20 LAB — COMPREHENSIVE METABOLIC PANEL
ALBUMIN: 3.7 g/dL (ref 3.5–5.2)
ALT: 9 U/L (ref 0–35)
AST: 13 U/L (ref 0–37)
Alkaline Phosphatase: 74 U/L (ref 39–117)
Anion gap: 15 (ref 5–15)
BILIRUBIN TOTAL: 0.3 mg/dL (ref 0.3–1.2)
BUN: 17 mg/dL (ref 6–23)
CHLORIDE: 106 meq/L (ref 96–112)
CO2: 22 mEq/L (ref 19–32)
Calcium: 9.2 mg/dL (ref 8.4–10.5)
Creatinine, Ser: 1.09 mg/dL (ref 0.50–1.10)
GFR calc Af Amer: 53 mL/min — ABNORMAL LOW (ref >90)
GFR calc non Af Amer: 46 mL/min — ABNORMAL LOW (ref >90)
Glucose, Bld: 127 mg/dL — ABNORMAL HIGH (ref 70–99)
POTASSIUM: 3.9 meq/L (ref 3.7–5.3)
SODIUM: 143 meq/L (ref 137–147)
Total Protein: 6.9 g/dL (ref 6.0–8.3)

## 2014-08-20 LAB — URINALYSIS, ROUTINE W REFLEX MICROSCOPIC
Bilirubin Urine: NEGATIVE
GLUCOSE, UA: NEGATIVE mg/dL
Ketones, ur: NEGATIVE mg/dL
Nitrite: POSITIVE — AB
Protein, ur: NEGATIVE mg/dL
SPECIFIC GRAVITY, URINE: 1.011 (ref 1.005–1.030)
Urobilinogen, UA: 0.2 mg/dL (ref 0.0–1.0)
pH: 5.5 (ref 5.0–8.0)

## 2014-08-20 LAB — URINE MICROSCOPIC-ADD ON

## 2014-08-20 LAB — CBC WITH DIFFERENTIAL/PLATELET
BASOS ABS: 0.1 10*3/uL (ref 0.0–0.1)
Basophils Relative: 1 % (ref 0–1)
Eosinophils Absolute: 0.3 10*3/uL (ref 0.0–0.7)
Eosinophils Relative: 4 % (ref 0–5)
HEMATOCRIT: 39.4 % (ref 36.0–46.0)
Hemoglobin: 13.3 g/dL (ref 12.0–15.0)
LYMPHS PCT: 23 % (ref 12–46)
Lymphs Abs: 1.6 10*3/uL (ref 0.7–4.0)
MCH: 28.9 pg (ref 26.0–34.0)
MCHC: 33.8 g/dL (ref 30.0–36.0)
MCV: 85.5 fL (ref 78.0–100.0)
Monocytes Absolute: 0.6 10*3/uL (ref 0.1–1.0)
Monocytes Relative: 9 % (ref 3–12)
NEUTROS ABS: 4.5 10*3/uL (ref 1.7–7.7)
NEUTROS PCT: 63 % (ref 43–77)
PLATELETS: 287 10*3/uL (ref 150–400)
RBC: 4.61 MIL/uL (ref 3.87–5.11)
RDW: 13.8 % (ref 11.5–15.5)
WBC: 7 10*3/uL (ref 4.0–10.5)

## 2014-08-20 LAB — I-STAT CG4 LACTIC ACID, ED: Lactic Acid, Venous: 1.68 mmol/L (ref 0.5–2.2)

## 2014-08-20 MED ORDER — IBUPROFEN 800 MG PO TABS: 800.0000 mg | ORAL_TABLET | Freq: Once | ORAL | Status: DC

## 2014-08-20 MED ORDER — CEFTRIAXONE SODIUM 1 G IJ SOLR
1.0000 g | Freq: Once | INTRAMUSCULAR | Status: AC
  Administered 2014-08-20: 1 g via INTRAMUSCULAR
  Filled 2014-08-20: qty 10

## 2014-08-20 MED ORDER — CEPHALEXIN 500 MG PO CAPS: 500.0000 mg | ORAL_CAPSULE | Freq: Four times a day (QID) | ORAL | Status: DC

## 2014-08-20 MED ORDER — ACETAMINOPHEN 500 MG PO TABS
1000.0000 mg | ORAL_TABLET | Freq: Once | ORAL | Status: AC
  Administered 2014-08-20: 1000 mg via ORAL
  Filled 2014-08-20: qty 2

## 2014-08-20 NOTE — ED Notes (Signed)
Gave Lactic to Dr. Claudine Mouton.

## 2014-08-20 NOTE — Discharge Instructions (Signed)
Facial or Scalp Contusion A facial or scalp contusion is a deep bruise on the face or head. Injuries to the face and head generally cause a lot of swelling, especially around the eyes. Contusions are the result of an injury that caused bleeding under the skin. The contusion may turn blue, purple, or yellow. Minor injuries will give you a painless contusion, but more severe contusions may stay painful and swollen for a few weeks.  CAUSES  A facial or scalp contusion is caused by a blunt injury or trauma to the face or head area.  SIGNS AND SYMPTOMS   Swelling of the injured area.   Discoloration of the injured area.   Tenderness, soreness, or pain in the injured area.  DIAGNOSIS  The diagnosis can be made by taking a medical history and doing a physical exam. An X-ray exam, CT scan, or MRI may be needed to determine if there are any associated injuries, such as broken bones (fractures). TREATMENT  Often, the best treatment for a facial or scalp contusion is applying cold compresses to the injured area. Over-the-counter medicines may also be recommended for pain control.  HOME CARE INSTRUCTIONS   Only take over-the-counter or prescription medicines as directed by your health care provider.   Apply ice to the injured area.   Put ice in a plastic bag.   Place a towel between your skin and the bag.   Leave the ice on for 20 minutes, 2-3 times a day.  SEEK MEDICAL CARE IF:  You have bite problems.   You have pain with chewing.   You are concerned about facial defects. SEEK IMMEDIATE MEDICAL CARE IF:  You have severe pain or a headache that is not relieved by medicine.   You have unusual sleepiness, confusion, or personality changes.   You throw up (vomit).   You have a persistent nosebleed.   You have double vision or blurred vision.   You have fluid drainage from your nose or ear.   You have difficulty walking or using your arms or legs.  MAKE SURE YOU:    Understand these instructions.  Will watch your condition.  Will get help right away if you are not doing well or get worse. Document Released: 12/02/2004 Document Revised: 08/15/2013 Document Reviewed: 06/07/2013 [facility] Patient Information 2015 [place], [place]. This information is not intended to replace advice given to you by your health care provider. Make sure you discuss any questions you have with your health care provider.  Urinary Tract Infection Urinary tract infections (UTIs) can develop anywhere along your urinary tract. Your urinary tract is your body's drainage system for removing wastes and extra water. Your urinary tract includes two kidneys, two ureters, a bladder, and a urethra. Your kidneys are a pair of bean-shaped organs. Each kidney is about the size of your fist. They are located below your ribs, one on each side of your spine. CAUSES Infections are caused by microbes, which are microscopic organisms, including fungi, viruses, and bacteria. These organisms are so small that they can only be seen through a microscope. Bacteria are the microbes that most commonly cause UTIs. SYMPTOMS  Symptoms of UTIs may vary by age and gender of the patient and by the location of the infection. Symptoms in young women typically include a frequent and intense urge to urinate and a painful, burning feeling in the bladder or urethra during urination. Older women and men are more likely to be tired, shaky, and weak and have muscle  aches and abdominal pain. A fever may mean the infection is in your kidneys. Other symptoms of a kidney infection include pain in your back or sides below the ribs, nausea, and vomiting. DIAGNOSIS To diagnose a UTI, your caregiver will ask you about your symptoms. Your caregiver also will ask to provide a urine sample. The urine sample will be tested for bacteria and white blood cells. White blood cells are made by your body to help fight infection. TREATMENT   Typically, UTIs can be treated with medication. Because most UTIs are caused by a bacterial infection, they usually can be treated with the use of antibiotics. The choice of antibiotic and length of treatment depend on your symptoms and the type of bacteria causing your infection. HOME CARE INSTRUCTIONS  If you were prescribed antibiotics, take them exactly as your caregiver instructs you. Finish the medication even if you feel better after you have only taken some of the medication.  Drink enough water and fluids to keep your urine clear or pale yellow.  Avoid caffeine, tea, and carbonated beverages. They tend to irritate your bladder.  Empty your bladder often. Avoid holding urine for long periods of time.  Empty your bladder before and after sexual intercourse.  After a bowel movement, women should cleanse from front to back. Use each tissue only once. SEEK MEDICAL CARE IF:   You have back pain.  You develop a fever.  Your symptoms do not begin to resolve within 3 days. SEEK IMMEDIATE MEDICAL CARE IF:   You have severe back pain or lower abdominal pain.  You develop chills.  You have nausea or vomiting.  You have continued burning or discomfort with urination. MAKE SURE YOU:   Understand these instructions.  Will watch your condition.  Will get help right away if you are not doing well or get worse. Document Released: 08/04/2005 Document Revised: 04/25/2012 Document Reviewed: 12/03/2011 [facility] Patient Information 2015 [place], [place]. This information is not intended to replace advice given to you by your health care provider. Make sure you discuss any questions you have with your health care provider.

## 2014-08-20 NOTE — ED Notes (Signed)
Pt states that she fell OOB this am and struck head on the nightstand; pt denies LOC; pt states that she is having a difficult time standing and using her legs since the fall; family reports that this is not normal for her

## 2014-08-20 NOTE — ED Provider Notes (Signed)
CSN: [id number]     Arrival date & time 08/20/14  1497 History   First MD Initiated Contact with Patient 08/20/14 [phone]     Chief Complaint  Patient presents with  . Fall     (Consider location/radiation/quality/duration/timing/severity/associated sxs/prior Treatment) HPI Janet Reeves Is an 80 year old female who presents emergency department for fall and head injury. The patient states that she was trying to close to the edge of the bed in her sleep and went over which caused her to fall out of her bed striking the back of her head against the corner of her night stand and walk to the floor. She complains of headache and scalp pain with a large hematoma. She denies any changes in vision, nausea, vomiting, neck stiffness. She has had some weakness in the leg and difficulty standing since the fall. She denies racing or skipping in her heart, chest pain, shortness of breath, abdominal pain. She is experiencing intermittent burning with urination and has a history of urinary tract infections. She does not take blood thinners.  Past Medical History  Diagnosis Date  . Diabetes mellitus     Diet controlled  . Hypertension   . Hypothyroid   . GERD (gastroesophageal reflux disease)   . Lymphoma     Chemotherapy  . Breast cancer     Mastectomy and chemotherapy  . Hyperlipidemia    Past Surgical History  Procedure Laterality Date  . Mastectomy    . Tonsillectomy    . Cholecystectomy    . Appendectomy     Family History  Problem Relation Age of Onset  . Heart disease Father     Rhythm disturbance  . Hypertension Father   . Cancer Mother   . Cancer Sister   . Hypertension Daughter   . Hyperlipidemia Daughter   . Hypertension Sister   . Stroke Sister    History  Substance Use Topics  . Smoking status: Never Smoker   . Smokeless tobacco: Not on file  . Alcohol Use: No   OB History   Grav Para Term Preterm Abortions TAB SAB Ect Mult Living                 Review of  Systems  Ten systems reviewed and are negative for acute change, except as noted in the HPI.    Allergies  Codeine and Macrobid  Home Medications   Prior to Admission medications   Medication Sig Start Date End Date Taking? Authorizing Provider  aspirin 81 MG tablet Take 81 mg by mouth daily.   Yes Historical Provider, MD  Cholecalciferol (VITAMIN D3) 5000 UNITS CAPS Take 5,000 Units by mouth daily.   Yes Historical Provider, MD  esomeprazole (NEXIUM) 40 MG capsule Take 40 mg by mouth daily at 12 noon.   Yes Historical Provider, MD  levothyroxine (SYNTHROID, LEVOTHROID) 100 MCG tablet Take 1 tablet by mouth daily. 01/30/13  Yes Historical Provider, MD  meclizine (ANTIVERT) 25 MG tablet Take 1 tablet (25 mg total) by mouth 3 (three) times daily as needed for dizziness or nausea. 07/04/14 07/04/15 Yes Unk Pinto, MD  Omega-3 Fatty Acids (FISH OIL) 1000 MG CAPS Take 1,000 mg by mouth daily.    Yes Historical Provider, MD  ranitidine (ZANTAC) 300 MG tablet Take 1 tablet by mouth 2 (two) times daily as needed for heartburn.  03/24/13  Yes Historical Provider, MD   BP 181/95  Pulse 97  Temp(Src) 97.9 F (36.6 C) (Oral)  Resp 20  SpO2 96%  Physical Exam  Nursing note and vitals reviewed. Constitutional: She is oriented to person, place, and time. She appears well-developed and well-nourished. No distress.  HENT:  Head: Normocephalic and atraumatic.  Mouth/Throat: Oropharynx is clear and moist.  Eyes: Conjunctivae and EOM are normal. Pupils are equal, round, and reactive to light. No scleral icterus.  No horizontal, vertical or rotational nystagmus  Neck: Normal range of motion. Neck supple.  Full active and passive ROM without pain No midline or paraspinal tenderness No nuchal rigidity or meningeal signs  Cardiovascular: Normal rate, regular rhythm and intact distal pulses.   Pulmonary/Chest: Effort normal and breath sounds normal. No respiratory distress. She has no wheezes. She has  no rales.  Abdominal: Soft. Bowel sounds are normal. There is no tenderness. There is no rebound and no guarding.  Musculoskeletal: Normal range of motion.  Lymphadenopathy:    She has no cervical adenopathy.  Neurological: She is alert and oriented to person, place, and time. She has normal reflexes. No cranial nerve deficit. She exhibits normal muscle tone. Coordination normal.  Mental Status:  Alert, oriented, thought content appropriate. Speech fluent without evidence of aphasia. Able to follow 2 step commands without difficulty.  Cranial Nerves:  II:  Peripheral visual fields grossly normal, pupils equal, round, reactive to light III,IV, VI: ptosis not present, extra-ocular motions intact bilaterally  V,VII: smile symmetric, facial light touch sensation equal VIII: hearing grossly normal bilaterally  IX,X: gag reflex present  XI: bilateral shoulder shrug equal and strong XII: midline tongue extension  Motor:  5/5 in upper and lower extremities bilaterally including strong and equal grip strength and dorsiflexion/plantar flexion Sensory: Pinprick and light touch normal in all extremities.  Deep Tendon Reflexes: 2+ and symmetric  Cerebellar: normal finger-to-nose with bilateral upper extremities Gait: normal gait  CV: distal pulses palpable throughout   Skin: Skin is warm and dry. No rash noted. She is not diaphoretic.  Psychiatric: She has a normal mood and affect. Her behavior is normal. Judgment and thought content normal.    ED Course  Procedures (including critical care time) Labs Review Labs Reviewed  COMPREHENSIVE METABOLIC PANEL - Abnormal; Notable for the following:    Glucose, Bld 127 (*)    GFR calc non Af Amer 46 (*)    GFR calc Af Amer 53 (*)    All other components within normal limits  URINALYSIS, ROUTINE W REFLEX MICROSCOPIC - Abnormal; Notable for the following:    APPearance CLOUDY (*)    Hgb urine dipstick TRACE (*)    Nitrite POSITIVE (*)    Leukocytes,  UA LARGE (*)    All other components within normal limits  URINE MICROSCOPIC-ADD ON - Abnormal; Notable for the following:    Bacteria, UA MANY (*)    All other components within normal limits  URINE CULTURE  CBC WITH DIFFERENTIAL  I-STAT CG4 LACTIC ACID, ED  Randolm Idol, ED    Imaging Review Dg Chest 2 View  08/20/2014   CLINICAL DATA:  Fall from bed. History breast cancer. Initial encounter.  EXAM: CHEST  2 VIEW  COMPARISON:  02/27/2013  FINDINGS: No cardiomegaly when accounting for a large, chronic hiatal hernia. Negative aortic contours. Minimal scarring or atelectasis at the bases. There is no edema, consolidation, effusion, or pneumothorax. Status post right mastectomy and axillary dissection. Remote T12 and L1 compression deformities with kyphosis. No acute osseous findings.  IMPRESSION: 1. No acute findings. 2. Mild basilar atelectasis or scar. 3. Large hiatal hernia.  Electronically Signed   By: Jorje Guild M.D.   On: 08/20/2014 06:12   Ct Head Wo Contrast  08/20/2014   CLINICAL DATA:  Fall from bed with right-sided head injury. No loss of consciousness. Initial encounter  EXAM: CT HEAD WITHOUT CONTRAST  CT CERVICAL SPINE WITHOUT CONTRAST  TECHNIQUE: Multidetector CT imaging of the head and cervical spine was performed following the standard protocol without intravenous contrast. Multiplanar CT image reconstructions of the cervical spine were also generated.  COMPARISON:  Head CT 10/14/2009  FINDINGS: CT HEAD FINDINGS  Skull and Sinuses:Negative for fracture or destructive process. The mastoids, middle ears, and imaged paranasal sinuses are clear.  Orbits: No traumatic findings.  Bilateral cataract resection.  Brain: No evidence of acute abnormality, such as acute infarction, hemorrhage, hydrocephalus, or mass lesion/mass effect.  There is age related generalized cerebral volume loss. Similar pattern of chronic small vessel disease with ischemic gliosis confluent throughout the  periventricular, subinsular, and deep cerebral white matter. Stable appearance of prominent CSF lateral to the cerebellum, out of proportion to folia enlargement.  CT CERVICAL SPINE FINDINGS  No evidence of acute fracture or traumatic malalignment. No gross cervical canal hematoma or prevertebral edema.  Cervical degenerative disc disease which is focally advanced at C5-6. At this level disc narrowing and uncovertebral spurring causes bilateral foraminal stenosis. There is left foraminal stenosis at C6-7 secondary to uncovertebral spur.  Thyroid atrophy.  IMPRESSION: No evidence of acute intracranial or cervical spine injury.   Electronically Signed   By: Jorje Guild M.D.   On: 08/20/2014 06:21   Ct Cervical Spine Wo Contrast  08/20/2014   CLINICAL DATA:  Fall from bed with right-sided head injury. No loss of consciousness. Initial encounter  EXAM: CT HEAD WITHOUT CONTRAST  CT CERVICAL SPINE WITHOUT CONTRAST  TECHNIQUE: Multidetector CT imaging of the head and cervical spine was performed following the standard protocol without intravenous contrast. Multiplanar CT image reconstructions of the cervical spine were also generated.  COMPARISON:  Head CT 10/14/2009  FINDINGS: CT HEAD FINDINGS  Skull and Sinuses:Negative for fracture or destructive process. The mastoids, middle ears, and imaged paranasal sinuses are clear.  Orbits: No traumatic findings.  Bilateral cataract resection.  Brain: No evidence of acute abnormality, such as acute infarction, hemorrhage, hydrocephalus, or mass lesion/mass effect.  There is age related generalized cerebral volume loss. Similar pattern of chronic small vessel disease with ischemic gliosis confluent throughout the periventricular, subinsular, and deep cerebral white matter. Stable appearance of prominent CSF lateral to the cerebellum, out of proportion to folia enlargement.  CT CERVICAL SPINE FINDINGS  No evidence of acute fracture or traumatic malalignment. No gross  cervical canal hematoma or prevertebral edema.  Cervical degenerative disc disease which is focally advanced at C5-6. At this level disc narrowing and uncovertebral spurring causes bilateral foraminal stenosis. There is left foraminal stenosis at C6-7 secondary to uncovertebral spur.  Thyroid atrophy.  IMPRESSION: No evidence of acute intracranial or cervical spine injury.   Electronically Signed   By: Jorje Guild M.D.   On: 08/20/2014 06:21     EKG Interpretation None       Date: 08/20/2014  Rate: 87  Rhythm: normal sinus rhythm  QRS Axis: normal  Intervals: normal  ST/T Wave abnormalities: normal  Conduction Disutrbances:right bundle branch block and left anterior fascicular block  Narrative Interpretation:   Old EKG Reviewed: unchanged    MDM   Final diagnoses:  Fall against object, initial encounter  Traumatic hematoma of  occiput, initial encounter  UTI (lower urinary tract infection)    8:26 AM BP 169/91  Pulse 92  Temp(Src) 98.5 F (36.9 C) (Oral)  Resp 18  SpO2 99% Patient with fall. Hematoma to the posterior occiput. Imaging is without acute abnormality. Patient is able to ambulate easily in the emergency department with normal neurologic exam. UA is positive for urinary tract infection. He has been sent for culture. She's received IM Rocephin here and will be discharged with Keflex. Chest x-ray is unchanged and EKG is unchanged as well. Patient's headache treated here with applied ice as well as Tylenol. Followup with her primary care physician in the next 48-72 hours. I discussed reasons to seek immediate care at the emergency department.  Patient / Family / Caregiver informed of clinical course, understand medical decision-making process, and agree with plan.  I personally reviewed the imaging tests through PACS system. I have reviewed and interpreted Lab values. I reviewed available ER/hospitalization records through the EMR  Patient seen in shared visit with  attending physician.     Margarita Mail, PA-C 08/20/14 [phone]

## 2014-08-21 NOTE — ED Provider Notes (Signed)
Medical screening examination/treatment/procedure(s) were conducted as a shared visit with non-physician practitioner(s) and myself.  I personally evaluated the patient during the encounter.   EKG Interpretation None     Patient with fall. Negative imaging. Not on anticoagulation. He will annular. Does have apparent UTI. Well-appearing and will discharge home  Jasper Riling. Alvino Chapel, MD 08/21/14 1556

## 2014-08-23 LAB — URINE CULTURE

## 2014-08-26 ENCOUNTER — Telehealth (HOSPITAL_BASED_OUTPATIENT_CLINIC_OR_DEPARTMENT_OTHER): Payer: Self-pay

## 2014-08-26 NOTE — Telephone Encounter (Signed)
Post ED Visit - Positive Culture Follow-up  Culture report reviewed by antimicrobial stewardship pharmacist: []  Wes Hamblen, Pharm.D., BCPS [x]  Heide Guile, Pharm.D., BCPS []  Alycia Rossetti, Pharm.D., BCPS []  [place], Pharm.D., BCPS, AAHIVP []  Legrand Como, Pharm.D., BCPS, AAHIVP []  Carly Sabat, Pharm.D. []  Elenor Quinones, Pharm.D.  Positive Urine culture Treated with Cephalexin, organism sensitive to the same and no further patient follow-up is required at this time.  Dortha Kern 08/26/2014, 4:14 AM

## 2014-08-27 ENCOUNTER — Other Ambulatory Visit: Payer: Self-pay

## 2014-08-27 ENCOUNTER — Ambulatory Visit (INDEPENDENT_AMBULATORY_CARE_PROVIDER_SITE_OTHER): Payer: Medicare Other

## 2014-08-27 ENCOUNTER — Other Ambulatory Visit: Payer: Self-pay | Admitting: Internal Medicine

## 2014-08-27 DIAGNOSIS — N390 Urinary tract infection, site not specified: Secondary | ICD-10-CM

## 2014-08-27 MED ORDER — CIPROFLOXACIN HCL 500 MG PO TABS: 500.0000 mg | ORAL_TABLET | Freq: Two times a day (BID) | ORAL | Status: AC

## 2014-08-27 NOTE — Addendum Note (Signed)
Addended by: Nadyne Coombes on: 08/27/2014 11:09 AM   Modules accepted: Orders

## 2014-08-27 NOTE — Progress Notes (Signed)
Patient ID: Janet Reeves, female   DOB: 1929-10-10, 80 y.o.   MRN: [id number] UTI symptoms since yesterday.

## 2014-08-28 LAB — URINALYSIS, MICROSCOPIC ONLY
CASTS: NONE SEEN
CRYSTALS: NONE SEEN
Squamous Epithelial / LPF: NONE SEEN

## 2014-08-28 LAB — URINALYSIS, ROUTINE W REFLEX MICROSCOPIC
Bilirubin Urine: NEGATIVE
GLUCOSE, UA: NEGATIVE mg/dL
HGB URINE DIPSTICK: NEGATIVE
KETONES UR: NEGATIVE mg/dL
NITRITE: POSITIVE — AB
Protein, ur: NEGATIVE mg/dL
SPECIFIC GRAVITY, URINE: 1.016 (ref 1.005–1.030)
Urobilinogen, UA: 0.2 mg/dL (ref 0.0–1.0)
pH: 6 (ref 5.0–8.0)

## 2014-08-28 LAB — URINE CULTURE

## 2014-09-30 ENCOUNTER — Other Ambulatory Visit: Payer: Self-pay | Admitting: *Deleted

## 2014-09-30 MED ORDER — LEVOTHYROXINE SODIUM 100 MCG PO TABS: 100.0000 ug | ORAL_TABLET | Freq: Every day | ORAL | Status: DC

## 2014-10-08 ENCOUNTER — Encounter: Payer: Self-pay | Admitting: Physician Assistant

## 2014-10-08 ENCOUNTER — Ambulatory Visit (INDEPENDENT_AMBULATORY_CARE_PROVIDER_SITE_OTHER): Payer: Medicare Other | Admitting: Physician Assistant

## 2014-10-08 VITALS — BP 132/82 | HR 80 | Temp 97.7°F | Resp 16 | Wt 187.0 lb

## 2014-10-08 DIAGNOSIS — E039 Hypothyroidism, unspecified: Secondary | ICD-10-CM

## 2014-10-08 DIAGNOSIS — C8590 Non-Hodgkin lymphoma, unspecified, unspecified site: Secondary | ICD-10-CM

## 2014-10-08 DIAGNOSIS — R7303 Prediabetes: Secondary | ICD-10-CM

## 2014-10-08 DIAGNOSIS — K2100 Gastro-esophageal reflux disease with esophagitis, without bleeding: Secondary | ICD-10-CM

## 2014-10-08 DIAGNOSIS — E782 Mixed hyperlipidemia: Secondary | ICD-10-CM

## 2014-10-08 DIAGNOSIS — Z79899 Other long term (current) drug therapy: Secondary | ICD-10-CM

## 2014-10-08 DIAGNOSIS — C50911 Malignant neoplasm of unspecified site of right female breast: Secondary | ICD-10-CM

## 2014-10-08 DIAGNOSIS — E559 Vitamin D deficiency, unspecified: Secondary | ICD-10-CM

## 2014-10-08 DIAGNOSIS — R6889 Other general symptoms and signs: Secondary | ICD-10-CM

## 2014-10-08 DIAGNOSIS — Z789 Other specified health status: Secondary | ICD-10-CM

## 2014-10-08 DIAGNOSIS — Z23 Encounter for immunization: Secondary | ICD-10-CM

## 2014-10-08 DIAGNOSIS — Z0001 Encounter for general adult medical examination with abnormal findings: Secondary | ICD-10-CM

## 2014-10-08 DIAGNOSIS — Z1331 Encounter for screening for depression: Secondary | ICD-10-CM

## 2014-10-08 DIAGNOSIS — I10 Essential (primary) hypertension: Secondary | ICD-10-CM

## 2014-10-08 LAB — CBC WITH DIFFERENTIAL/PLATELET
BASOS ABS: 0.1 10*3/uL (ref 0.0–0.1)
Basophils Relative: 1 % (ref 0–1)
EOS ABS: 0.2 10*3/uL (ref 0.0–0.7)
Eosinophils Relative: 3 % (ref 0–5)
HEMATOCRIT: 42.3 % (ref 36.0–46.0)
Hemoglobin: 13.9 g/dL (ref 12.0–15.0)
Lymphocytes Relative: 16 % (ref 12–46)
Lymphs Abs: 1.2 10*3/uL (ref 0.7–4.0)
MCH: 28.1 pg (ref 26.0–34.0)
MCHC: 32.9 g/dL (ref 30.0–36.0)
MCV: 85.5 fL (ref 78.0–100.0)
MONOS PCT: 7 % (ref 3–12)
MPV: 10 fL (ref 9.4–12.4)
Monocytes Absolute: 0.5 10*3/uL (ref 0.1–1.0)
Neutro Abs: 5.6 10*3/uL (ref 1.7–7.7)
Neutrophils Relative %: 73 % (ref 43–77)
Platelets: 379 10*3/uL (ref 150–400)
RBC: 4.95 MIL/uL (ref 3.87–5.11)
RDW: 14 % (ref 11.5–15.5)
WBC: 7.7 10*3/uL (ref 4.0–10.5)

## 2014-10-08 NOTE — Progress Notes (Signed)
MEDICARE ANNUAL WELLNESS VISIT AND FOLLOW UP  Assessment:   1. Need for prophylactic vaccination and inoculation against influenza - Flu vaccine HIGH DOSE PF  2. Essential hypertension - continue medications, DASH diet, exercise and monitor at home. Call if greater than 130/80.  - CBC with Differential - BASIC METABOLIC PANEL WITH GFR - Hepatic function panel  3. Gastroesophageal reflux disease with esophagitis Continue PPI/H2 blocker, diet discussed  4. Hypothyroidism, unspecified hypothyroidism type Hypothyroidism-check TSH level, continue medications the same, reminded to take on an empty stomach 30-40mins before food.  - TSH  5. Hyperlipidemia -continue medications, check lipids, decrease fatty foods, increase activity.  - Lipid panel  6. Prediabetes Discussed general issues about diabetes pathophysiology and management., Educational material distributed., Suggested low cholesterol diet., Encouraged aerobic exercise., Discussed foot care., Reminded to get yearly retinal exam. - Hemoglobin A1c - Insulin, fasting - HM DIABETES FOOT EXAM  7. Vitamin D deficiency - Vit D  25 hydroxy (rtn osteoporosis monitoring)  8. Lymphoma Check CBC  9. Medication management - Magnesium  10. Breast cancer, right S/p masectomy declines MGM  Plan:   During the course of the visit the patient was educated and counseled about appropriate screening and preventive services including:    Pneumococcal vaccine   Influenza vaccine  Td vaccine  Screening electrocardiogram  Screening mammography  Bone densitometry screening  Colorectal cancer screening  Diabetes screening  Glaucoma screening  Nutrition counseling   Advanced directives: given info/requested  Screening recommendations, referrals:  Vaccinations: Please see documentation below and orders this visit.   Nutrition assessed and recommended  Colonoscopy declined Mammogram declined Pap smear not  indicated Pelvic exam not indicated Recommended yearly ophthalmology/optometry visit for glaucoma screening and checkup Recommended yearly dental visit for hygiene and checkup Advanced directives - requested  Conditions/risks identified: BMI: Discussed weight loss, diet, and increase physical activity.  Increase physical activity: AHA recommends 150 minutes of physical activity a week.  Medications reviewed DEXA- declined Diabetes is at goal, ACE/ARB therapy: Yes. Urinary Incontinence is not an issue: discussed non pharmacology and pharmacology options.  Fall risk: low- discussed PT, home fall assessment, medications.    Subjective:   Janet Reeves is a 80 y.o. female who presents for Medicare Annual Wellness Visit and 3 month follow up on hypertension, prediabetes, hyperlipidemia, vitamin D def.  Date of last medicare wellness visit is unknown.   Her blood pressure has been controlled at home, today their BP is BP: 132/82 mmHg She does workout. She denies chest pain, shortness of breath, dizziness.  She is not on cholesterol medication and denies myalgias. Her cholesterol is at goal. The cholesterol last visit was:   Lab Results  Component Value Date   CHOL 228* 07/04/2014   HDL 50 07/04/2014   LDLCALC 125* 07/04/2014   TRIG 263* 07/04/2014   CHOLHDL 4.6 07/04/2014   She has been working on diet and exercise for prediabetes, and denies paresthesia of the feet, polydipsia and polyuria. Last A1C in the office was:  Lab Results  Component Value Date   HGBA1C 6.2* 07/04/2014   Patient is on Vitamin D supplement. Lab Results  Component Value Date   VD25OH 74 07/04/2014      Names of Other Physician/Practitioners you currently use: 1. [facility]- here for primary care 2. Dr. Katy Fitch, eye doctor, last visit 1 year ago 3. Dr, Adrian Prince, dentist, last visit 1 year ago Patient Care Team: Unk Pinto, MD as PCP - General (Internal  Medicine)  Medication Review Current Outpatient Prescriptions on File Prior to Visit  Medication Sig Dispense Refill  . aspirin 81 MG tablet Take 81 mg by mouth daily.    . cephALEXin (KEFLEX) 500 MG capsule Take 1 capsule (500 mg total) by mouth 4 (four) times daily. 20 capsule 0  . Cholecalciferol (VITAMIN D3) 5000 UNITS CAPS Take 5,000 Units by mouth daily.    Marland Kitchen esomeprazole (NEXIUM) 40 MG capsule Take 40 mg by mouth daily at 12 noon.    Marland Kitchen levothyroxine (SYNTHROID, LEVOTHROID) 100 MCG tablet Take 1 tablet (100 mcg total) by mouth daily. 90 tablet 2  . meclizine (ANTIVERT) 25 MG tablet Take 1 tablet (25 mg total) by mouth 3 (three) times daily as needed for dizziness or nausea. 90 tablet 1  . Omega-3 Fatty Acids (FISH OIL) 1000 MG CAPS Take 1,000 mg by mouth daily.     . ranitidine (ZANTAC) 300 MG tablet Take 1 tablet by mouth 2 (two) times daily as needed for heartburn.      No current facility-administered medications on file prior to visit.    Current Problems (verified) Patient Active Problem List   Diagnosis Date Noted  . Encounter for long-term (current) use of other medications 03/29/2014  . GERD 03/29/2014  . Hyperlipidemia 10/01/2013  . PreDiabetes 10/01/2013  . Vitamin D Deficiency 10/01/2013  . Hypothyroidism 10/01/2013  . Breast cancer 10/01/2013  . Lymphoma 10/01/2013  . Hypertension 09/30/2013  . Syncope 03/29/2013    Screening Tests Health Maintenance  Topic Date Due  . ZOSTAVAX  12/24/1990  . COLONOSCOPY  06/21/2011  . INFLUENZA VACCINE  06/09/2015  . TETANUS/TDAP  06/06/2017  . PNEUMOCOCCAL POLYSACCHARIDE VACCINE AGE 49 AND OVER  Completed     Immunization History  Administered Date(s) Administered  . DTaP 06/07/2007  . H1N1 10/15/2008  . Influenza Whole 08/30/2013  . Influenza, High Dose Seasonal PF 10/08/2014  . Pneumococcal Polysaccharide-23 01/26/2011    Preventative care: Last colonoscopy: 2002 will not get another Last mammogram:  2011 Last pap smear/pelvic exam: remote DEXA:declines Echo: 04/2013 MRI Head 2008  Prior vaccinations: TD or Tdap: 2008  Influenza: 2015 Pneumococcal: 2012 Prevnar13:: Wants to get in March  Shingles/Zostavax: declines  History reviewed: allergies, current medications, past family history, past medical history, past social history, past surgical history and problem list    Medication List       This list is accurate as of: 10/08/14 11:47 AM.  Always use your most recent med list.               aspirin 81 MG tablet  Take 81 mg by mouth daily.     cephALEXin 500 MG capsule  Commonly known as:  KEFLEX  Take 1 capsule (500 mg total) by mouth 4 (four) times daily.     esomeprazole 40 MG capsule  Commonly known as:  NEXIUM  Take 40 mg by mouth daily at 12 noon.     Fish Oil 1000 MG Caps  Take 1,000 mg by mouth daily.     levothyroxine 100 MCG tablet  Commonly known as:  SYNTHROID, LEVOTHROID  Take 1 tablet (100 mcg total) by mouth daily.     meclizine 25 MG tablet  Commonly known as:  ANTIVERT  Take 1 tablet (25 mg total) by mouth 3 (three) times daily as needed for dizziness or nausea.     ranitidine 300 MG tablet  Commonly known as:  ZANTAC  Take 1 tablet by mouth 2 (  two) times daily as needed for heartburn.     Vitamin D3 5000 UNITS Caps  Take 5,000 Units by mouth daily.        Past Surgical History  Procedure Laterality Date  . Mastectomy    . Tonsillectomy    . Cholecystectomy    . Appendectomy     Family History  Problem Relation Age of Onset  . Heart disease Father     Rhythm disturbance  . Hypertension Father   . Cancer Mother   . Cancer Sister   . Hypertension Daughter   . Hyperlipidemia Daughter   . Hypertension Sister   . Stroke Sister     Risk Factors: Osteoporosis/FallRisk: postmenopausal estrogen deficiency and dietary calcium and/or vitamin D deficiency In the past year have you fallen or had a near fall?:No History of fracture  in the past year: no  Tobacco History  Substance Use Topics  . Smoking status: Never Smoker   . Smokeless tobacco: Not on file  . Alcohol Use: No   She does not smoke.  Patient is not a former smoker. Are there smokers in your home (other than you)?  No  Alcohol Current alcohol use: none  Caffeine Current caffeine use: coffee 1 /day  Exercise Current exercise: walking and walks her driveway daily  Nutrition/Diet Current diet: in general, a "healthy" diet    Cardiac risk factors: advanced age (older than 65 for men, 75 for women), dyslipidemia, hypertension, obesity (BMI >= 30 kg/m2) and sedentary lifestyle.  Depression Screen (Note: if answer to either of the following is "Yes", a more complete depression screening is indicated)   Q1: Over the past two weeks, have you felt down, depressed or hopeless? No  Q2: Over the past two weeks, have you felt little interest or pleasure in doing things? No  Have you lost interest or pleasure in daily life? No  Do you often feel hopeless? No  Do you cry easily over simple problems? No  Activities of Daily Living In your present state of health, do you have any difficulty performing the following activities?:  Driving? No Managing money?  No Feeding yourself? No Getting from bed to chair? No Climbing a flight of stairs? No Preparing food and eating?: No Bathing or showering? No Getting dressed: No Getting to the toilet? No Using the toilet:No Moving around from place to place: No   Are you sexually active?  No  Do you have more than one partner?  No  Vision Difficulties: No  Hearing Difficulties: No Do you often ask people to speak up or repeat themselves? No Do you experience ringing or noises in your ears? No Do you have difficulty understanding soft or whispered voices? No  Cognition  Do you feel that you have a problem with memory?No  Do you often misplace items? No  Do you feel safe at home?  Yes  Advanced  directives Does patient have a Katherine? Yes Does patient have a Living Will? Yes   Objective:   Blood pressure 132/82, pulse 80, temperature 97.7 F (36.5 C), resp. rate 16, weight 187 lb (84.823 kg). Body mass index is 33.13 kg/(m^2).  General appearance: alert, no distress, WD/WN,  female Cognitive Testing  Alert? Yes  Normal Appearance?Yes  Oriented to person? Yes  Place? Yes   Time? Yes  Recall of three objects?  Yes  Can perform simple calculations? Yes  Displays appropriate judgment?Yes  Can read the correct time from a  watch face?Yes  HEENT: normocephalic, sclerae anicteric, TMs pearly, nares patent, no discharge or erythema, pharynx normal Oral cavity: MMM, no lesions Neck: supple, no lymphadenopathy, no thyromegaly, no masses Heart: RRR, normal S1, S2, no murmurs Lungs: CTA bilaterally, no wheezes, rhonchi, or rales Abdomen: +bs, soft, non tender, non distended, no masses, no hepatomegaly, no splenomegaly Musculoskeletal: nontender, no swelling, no obvious deformity Extremities: 1+ edema, no cyanosis, no clubbing Pulses: 2+ symmetric, upper and lower extremities, normal cap refill Neurological: alert, oriented x 3, CN2-12 intact, strength normal upper extremities and lower extremities, sensation normal throughout, DTRs 2+ throughout, no cerebellar signs, gait normal Psychiatric: normal affect, behavior normal, pleasant  Breast: defer Gyn: defer Rectal: defer  Medicare Attestation I have personally reviewed: The patient's medical and social history Their use of alcohol, tobacco or illicit drugs Their current medications and supplements The patient's functional ability including ADLs,fall risks, home safety risks, cognitive, and hearing and visual impairment Diet and physical activities Evidence for depression or mood disorders  The patient's weight, height, BMI, and visual acuity have been recorded in the chart.  I have made referrals,  counseling, and provided education to the patient based on review of the above and I have provided the patient with a written personalized care plan for preventive services.     Vicie Mutters, PA-C   10/08/2014

## 2014-10-09 LAB — HEPATIC FUNCTION PANEL
AST: 16 U/L (ref 0–37)
Albumin: 4 g/dL (ref 3.5–5.2)
Alkaline Phosphatase: 72 U/L (ref 39–117)
Bilirubin, Direct: 0.1 mg/dL (ref 0.0–0.3)
Indirect Bilirubin: 0.5 mg/dL (ref 0.2–1.2)
TOTAL PROTEIN: 6.7 g/dL (ref 6.0–8.3)
Total Bilirubin: 0.6 mg/dL (ref 0.2–1.2)

## 2014-10-09 LAB — BASIC METABOLIC PANEL WITH GFR
BUN: 15 mg/dL (ref 6–23)
CALCIUM: 9.6 mg/dL (ref 8.4–10.5)
CO2: 24 mEq/L (ref 19–32)
Chloride: 104 mEq/L (ref 96–112)
Creat: 1.19 mg/dL — ABNORMAL HIGH (ref 0.50–1.10)
GFR, EST NON AFRICAN AMERICAN: 42 mL/min — AB
GFR, Est African American: 49 mL/min — ABNORMAL LOW
GLUCOSE: 123 mg/dL — AB (ref 70–99)
Potassium: 4.3 mEq/L (ref 3.5–5.3)
SODIUM: 139 meq/L (ref 135–145)

## 2014-10-09 LAB — LIPID PANEL
CHOLESTEROL: 250 mg/dL — AB (ref 0–200)
HDL: 52 mg/dL (ref >39)
LDL Cholesterol: 165 mg/dL — ABNORMAL HIGH (ref 0–99)
TRIGLYCERIDES: 164 mg/dL — AB (ref <150)
Total CHOL/HDL Ratio: 4.8 Ratio
VLDL: 33 mg/dL (ref 0–40)

## 2014-10-09 LAB — VITAMIN D 25 HYDROXY (VIT D DEFICIENCY, FRACTURES): Vit D, 25-Hydroxy: 43 ng/mL (ref 30–100)

## 2014-10-09 LAB — INSULIN, FASTING: INSULIN FASTING, SERUM: 29.2 u[IU]/mL — AB (ref 2.0–19.6)

## 2014-10-09 LAB — HEMOGLOBIN A1C
Hgb A1c MFr Bld: 5.9 % — ABNORMAL HIGH (ref <5.7)
MEAN PLASMA GLUCOSE: 123 mg/dL — AB (ref <117)

## 2014-10-09 LAB — TSH: TSH: 43.257 u[IU]/mL — ABNORMAL HIGH (ref 0.350–4.500)

## 2014-10-09 LAB — MAGNESIUM: MAGNESIUM: 1.7 mg/dL (ref 1.5–2.5)

## 2014-10-24 ENCOUNTER — Ambulatory Visit: Payer: Self-pay

## 2014-10-26 ENCOUNTER — Other Ambulatory Visit: Payer: Self-pay | Admitting: Internal Medicine

## 2015-01-15 ENCOUNTER — Ambulatory Visit: Payer: Medicare Other | Admitting: Internal Medicine

## 2015-01-15 NOTE — Progress Notes (Signed)
Patient ID: Janet Reeves, female   DOB: 08-Jan-1929, 81 y.o.   MRN: [id number]  C  A N  C  E  L  L  E  D

## 2015-01-21 ENCOUNTER — Ambulatory Visit: Payer: Self-pay | Admitting: Internal Medicine

## 2015-01-22 ENCOUNTER — Ambulatory Visit (INDEPENDENT_AMBULATORY_CARE_PROVIDER_SITE_OTHER): Payer: Medicare Other | Admitting: Internal Medicine

## 2015-01-22 ENCOUNTER — Encounter: Payer: Self-pay | Admitting: Internal Medicine

## 2015-01-22 VITALS — BP 106/68 | HR 88 | Temp 98.4°F | Resp 16 | Ht 63.0 in | Wt 187.0 lb

## 2015-01-22 DIAGNOSIS — R7309 Other abnormal glucose: Secondary | ICD-10-CM | POA: Diagnosis not present

## 2015-01-22 DIAGNOSIS — E559 Vitamin D deficiency, unspecified: Secondary | ICD-10-CM

## 2015-01-22 DIAGNOSIS — E039 Hypothyroidism, unspecified: Secondary | ICD-10-CM | POA: Diagnosis not present

## 2015-01-22 DIAGNOSIS — I10 Essential (primary) hypertension: Secondary | ICD-10-CM | POA: Diagnosis not present

## 2015-01-22 DIAGNOSIS — Z79899 Other long term (current) drug therapy: Secondary | ICD-10-CM | POA: Diagnosis not present

## 2015-01-22 DIAGNOSIS — R7303 Prediabetes: Secondary | ICD-10-CM

## 2015-01-22 DIAGNOSIS — E782 Mixed hyperlipidemia: Secondary | ICD-10-CM

## 2015-01-22 LAB — CBC WITH DIFFERENTIAL/PLATELET
BASOS PCT: 1 % (ref 0–1)
Basophils Absolute: 0.1 10*3/uL (ref 0.0–0.1)
EOS ABS: 0.2 10*3/uL (ref 0.0–0.7)
Eosinophils Relative: 2 % (ref 0–5)
HCT: 42 % (ref 36.0–46.0)
Hemoglobin: 14.2 g/dL (ref 12.0–15.0)
Lymphocytes Relative: 20 % (ref 12–46)
Lymphs Abs: 1.8 10*3/uL (ref 0.7–4.0)
MCH: 29 pg (ref 26.0–34.0)
MCHC: 33.8 g/dL (ref 30.0–36.0)
MCV: 85.7 fL (ref 78.0–100.0)
MPV: 9.8 fL (ref 8.6–12.4)
Monocytes Absolute: 0.6 10*3/uL (ref 0.1–1.0)
Monocytes Relative: 7 % (ref 3–12)
NEUTROS PCT: 70 % (ref 43–77)
Neutro Abs: 6.2 10*3/uL (ref 1.7–7.7)
PLATELETS: 350 10*3/uL (ref 150–400)
RBC: 4.9 MIL/uL (ref 3.87–5.11)
RDW: 15.6 % — ABNORMAL HIGH (ref 11.5–15.5)
WBC: 8.8 10*3/uL (ref 4.0–10.5)

## 2015-01-22 NOTE — Patient Instructions (Signed)

## 2015-01-22 NOTE — Progress Notes (Signed)
Patient ID: Xiao Graul, female   DOB: 1929/01/24, 81 y.o.   MRN: [id number] Assessment and Plan:  Hypertension:  -Continue medication,  -monitor blood pressure at home.  -Continue DASH diet.   -Reminder to go to the ER if any CP, SOB, nausea, dizziness, severe HA, changes vision/speech, left arm numbness and tingling, and jaw pain.  Cholesterol: -Continue diet and exercise.  -Check cholesterol.   Pre-diabetes: -Continue diet and exercise.  -Check A1C  Vitamin D Def: -check level -continue medications.   Continue diet and meds as discussed. Further disposition pending results of labs.  HPI 81 y.o. female  presents for 3 month follow up with hypertension, hyperlipidemia, prediabetes and vitamin D.   Her blood pressure has been controlled at home, today their BP is BP: 106/68 mmHg.   She does not workout.  She states that she does walk up and down her driveway.   She denies chest pain, shortness of breath, dizziness.   She is not on cholesterol medication and denies myalgias. Her cholesterol is not at goal. The cholesterol last visit was:   Lab Results  Component Value Date   CHOL 250* 10/08/2014   HDL 52 10/08/2014   LDLCALC 165* 10/08/2014   TRIG 164* 10/08/2014   CHOLHDL 4.8 10/08/2014     She has not been working on diet and exercise for prediabetes, and denies foot ulcerations, hyperglycemia, hypoglycemia , increased appetite, nausea, paresthesia of the feet, polydipsia, polyuria, visual disturbances, vomiting and weight loss. Last A1C in the office was:  Lab Results  Component Value Date   HGBA1C 5.9* 10/08/2014    Patient is on Vitamin D supplement.  Lab Results  Component Value Date   VD25OH 46 10/08/2014      She is still taking the supplement.    She reports that she has been having itchy watery eyes, dripping nose, itchy nose, she coughs once in a while.  She denies drainage from her throat.  She does not take anything daily for it.    Current Medications:   Current Outpatient Prescriptions on File Prior to Visit  Medication Sig Dispense Refill  . aspirin 81 MG tablet Take 81 mg by mouth daily.    . Cholecalciferol (VITAMIN D3) 5000 UNITS CAPS Take 5,000 Units by mouth daily.    Marland Kitchen levothyroxine (SYNTHROID, LEVOTHROID) 100 MCG tablet Take 1 tablet (100 mcg total) by mouth daily. 90 tablet 2  . Omega-3 Fatty Acids (FISH OIL) 1000 MG CAPS Take 1,000 mg by mouth daily.     . ranitidine (ZANTAC) 300 MG tablet take 1 tablet by mouth twice a day 180 tablet 11   No current facility-administered medications on file prior to visit.    Medical History:  Past Medical History  Diagnosis Date  . Diabetes mellitus     Diet controlled  . Hypertension   . Hypothyroid   . GERD (gastroesophageal reflux disease)   . Lymphoma     Chemotherapy  . Breast cancer     Mastectomy and chemotherapy  . Hyperlipidemia     Allergies:  Allergies  Allergen Reactions  . Codeine Nausea And Vomiting  . Macrobid [Nitrofurantoin Macrocrystal] Other (See Comments)    Dizziness.     Review of Systems:  Review of Systems  Constitutional: Negative for fever, chills and malaise/fatigue.  HENT: Negative for congestion, ear pain, hearing loss, nosebleeds, sore throat and tinnitus.   Eyes: Positive for discharge (watery, itchy).  Respiratory: Negative for cough, sputum production, shortness of  breath and wheezing.   Cardiovascular: Negative for chest pain, orthopnea and claudication.  Gastrointestinal: Negative for heartburn, nausea, vomiting, diarrhea, constipation, blood in stool and melena.  Genitourinary: Negative.   Musculoskeletal: Negative.   Skin: Negative.   Neurological: Negative.  Negative for weakness and headaches.  Psychiatric/Behavioral: Negative.     Family history- Review and unchanged  Social history- Review and unchanged  Physical Exam: BP 106/68 mmHg  Pulse 88  Temp(Src) 98.4 F (36.9 C) (Temporal)  Resp 16  Ht 5\' 3"  (1.6 m)  Wt 187 lb  (84.823 kg)  BMI 33.13 kg/m2 Wt Readings from Last 3 Encounters:  01/22/15 187 lb (84.823 kg)  10/08/14 187 lb (84.823 kg)  07/04/14 184 lb 9.6 oz (83.734 kg)    General Appearance: Well nourished well developed, in no apparent distress. Eyes: PERRLA, EOMs, conjunctiva no swelling or erythema ENT/Mouth: Ear canals normal without obstruction, swelling, erythma, discharge.  TMs normal bilaterally.  Oropharynx moist, clear, without exudate, or postoropharyngeal swelling.Mucosal Edema in the nose Neck: Supple, thyroid normal,no cervical adenopathy  Respiratory: Respiratory effort normal, Breath sounds clear A&P without rhonchi, wheeze, or rale.  No retractions, no accessory usage. Cardio: RRR with no MRGs. Brisk peripheral pulses without edema.  Abdomen: Soft, + BS,  Non tender, no guarding, rebound, hernias, masses. Musculoskeletal: Full ROM, 5/5 strength, Shuffling gait Skin: Warm, dry without rashes, lesions, ecchymosis.  Neuro: Awake and oriented X 3, Cranial nerves intact. Normal muscle tone, no cerebellar symptoms. Psych: Normal affect, Insight and Judgment appropriate.    FORCUCCI, Laurie Penado, PA-C 2:26 PM [facility]

## 2015-01-23 ENCOUNTER — Other Ambulatory Visit: Payer: Self-pay | Admitting: Internal Medicine

## 2015-01-23 ENCOUNTER — Other Ambulatory Visit: Payer: Self-pay | Admitting: *Deleted

## 2015-01-23 LAB — HEPATIC FUNCTION PANEL
ALBUMIN: 4.3 g/dL (ref 3.5–5.2)
ALT: 9 U/L (ref 0–35)
AST: 16 U/L (ref 0–37)
Alkaline Phosphatase: 69 U/L (ref 39–117)
BILIRUBIN DIRECT: 0.1 mg/dL (ref 0.0–0.3)
BILIRUBIN TOTAL: 0.6 mg/dL (ref 0.2–1.2)
Indirect Bilirubin: 0.5 mg/dL (ref 0.2–1.2)
Total Protein: 6.9 g/dL (ref 6.0–8.3)

## 2015-01-23 LAB — LIPID PANEL
Cholesterol: 222 mg/dL — ABNORMAL HIGH (ref 0–200)
HDL: 35 mg/dL — ABNORMAL LOW (ref >=46)
LDL Cholesterol: 141 mg/dL — ABNORMAL HIGH (ref 0–99)
Total CHOL/HDL Ratio: 6.3 Ratio
Triglycerides: 229 mg/dL — ABNORMAL HIGH (ref <150)
VLDL: 46 mg/dL — AB (ref 0–40)

## 2015-01-23 LAB — BASIC METABOLIC PANEL WITH GFR
BUN: 14 mg/dL (ref 6–23)
CO2: 25 meq/L (ref 19–32)
CREATININE: 1.11 mg/dL — AB (ref 0.50–1.10)
Calcium: 9.8 mg/dL (ref 8.4–10.5)
Chloride: 105 mEq/L (ref 96–112)
GFR, EST NON AFRICAN AMERICAN: 46 mL/min — AB
GFR, Est African American: 53 mL/min — ABNORMAL LOW
Glucose, Bld: 102 mg/dL — ABNORMAL HIGH (ref 70–99)
POTASSIUM: 4 meq/L (ref 3.5–5.3)
Sodium: 141 mEq/L (ref 135–145)

## 2015-01-23 LAB — TSH: TSH: 21.138 u[IU]/mL — ABNORMAL HIGH (ref 0.350–4.500)

## 2015-01-23 LAB — VITAMIN D 25 HYDROXY (VIT D DEFICIENCY, FRACTURES): VIT D 25 HYDROXY: 34 ng/mL (ref 30–100)

## 2015-01-23 LAB — MAGNESIUM: Magnesium: 1.8 mg/dL (ref 1.5–2.5)

## 2015-01-23 LAB — HEMOGLOBIN A1C
Hgb A1c MFr Bld: 6.1 % — ABNORMAL HIGH (ref <5.7)
Mean Plasma Glucose: 128 mg/dL — ABNORMAL HIGH (ref <117)

## 2015-01-23 LAB — INSULIN, FASTING: Insulin fasting, serum: 6.3 u[IU]/mL (ref 2.0–19.6)

## 2015-01-23 MED ORDER — LEVOTHYROXINE SODIUM 150 MCG PO TABS: 150.0000 ug | ORAL_TABLET | Freq: Every day | ORAL | Status: AC

## 2015-01-23 MED ORDER — EZETIMIBE 10 MG PO TABS: 10.0000 mg | ORAL_TABLET | Freq: Every day | ORAL | Status: DC

## 2015-02-07 ENCOUNTER — Encounter: Payer: Self-pay | Admitting: Internal Medicine

## 2015-02-07 ENCOUNTER — Ambulatory Visit (INDEPENDENT_AMBULATORY_CARE_PROVIDER_SITE_OTHER): Payer: Medicare Other | Admitting: Internal Medicine

## 2015-02-07 VITALS — BP 138/86 | HR 70 | Temp 98.2°F | Resp 16 | Ht 63.0 in | Wt 190.0 lb

## 2015-02-07 DIAGNOSIS — Z0001 Encounter for general adult medical examination with abnormal findings: Secondary | ICD-10-CM | POA: Diagnosis not present

## 2015-02-07 DIAGNOSIS — R7309 Other abnormal glucose: Secondary | ICD-10-CM

## 2015-02-07 DIAGNOSIS — Z0000 Encounter for general adult medical examination without abnormal findings: Secondary | ICD-10-CM

## 2015-02-07 DIAGNOSIS — M546 Pain in thoracic spine: Secondary | ICD-10-CM

## 2015-02-07 DIAGNOSIS — K219 Gastro-esophageal reflux disease without esophagitis: Secondary | ICD-10-CM | POA: Diagnosis not present

## 2015-02-07 DIAGNOSIS — R6889 Other general symptoms and signs: Secondary | ICD-10-CM | POA: Diagnosis not present

## 2015-02-07 DIAGNOSIS — C8590 Non-Hodgkin lymphoma, unspecified, unspecified site: Secondary | ICD-10-CM

## 2015-02-07 DIAGNOSIS — Z79899 Other long term (current) drug therapy: Secondary | ICD-10-CM

## 2015-02-07 DIAGNOSIS — C50911 Malignant neoplasm of unspecified site of right female breast: Secondary | ICD-10-CM

## 2015-02-07 DIAGNOSIS — Z9181 History of falling: Secondary | ICD-10-CM

## 2015-02-07 DIAGNOSIS — I10 Essential (primary) hypertension: Secondary | ICD-10-CM | POA: Diagnosis not present

## 2015-02-07 DIAGNOSIS — E782 Mixed hyperlipidemia: Secondary | ICD-10-CM

## 2015-02-07 DIAGNOSIS — R7303 Prediabetes: Secondary | ICD-10-CM

## 2015-02-07 DIAGNOSIS — E039 Hypothyroidism, unspecified: Secondary | ICD-10-CM

## 2015-02-07 DIAGNOSIS — E559 Vitamin D deficiency, unspecified: Secondary | ICD-10-CM

## 2015-02-07 DIAGNOSIS — Z1331 Encounter for screening for depression: Secondary | ICD-10-CM

## 2015-02-07 MED ORDER — DIAZEPAM 5 MG PO TABS: 2.5000 mg | ORAL_TABLET | Freq: Four times a day (QID) | ORAL | Status: DC | PRN

## 2015-02-07 NOTE — Progress Notes (Signed)
Patient ID: Janet Reeves, female   DOB: 17-Jul-1929, 81 y.o.   MRN: [id number]   [facility] VISIT AND CPE  Assessment:    1. Essential hypertension -cont diet and exercise -cont baby aspirin -dash diet and exercise -monitor at home  2. Gastroesophageal reflux disease, esophagitis presence not specified -ranitidine as needed -foods to avoid discussed  3. Prediabetes -at goal -cont diet and exercise  4. Hypothyroidism, unspecified hypothyroidism type -cont levothyroxine -at goal currently  5. Hyperlipidemia -cont diet and exercise -cont zetia  6. Vitamin D deficiency -cont supp  7. Breast cancer, right -cont self breast exams -NEC at this time  8. Lymphoma -managed by oncology  9. Medication management -labs recently checked  10. Left-sided thoracic back pain -likely muscle strain -no evidence of cauda equina -cont aleve prn  -valium PRN - Urinalysis, Routine w reflex microscopic - Culture, Urine     Plan:   During the course of the visit the patient was educated and counseled about appropriate screening and preventive services including:    Pneumococcal vaccine   Influenza vaccine  Td vaccine  Screening electrocardiogram  Bone densitometry screening  Colorectal cancer screening  Diabetes screening  Glaucoma screening  Nutrition counseling   Advanced directives: requested  Screening recommendations, referrals: Vaccinations:  Immunization History  Administered Date(s) Administered  . DTaP 06/07/2007  . H1N1 10/15/2008  . Influenza Whole 08/30/2013  . Influenza, High Dose Seasonal PF 10/08/2014  . Pneumococcal Polysaccharide-23 01/26/2011    Tdap vaccine not indicated Influenza vaccine not indicated Pneumococcal vaccine not indicated Prevnar vaccine requested, out of stock today Shingles vaccine not indicated Hep B vaccine not indicated  Nutrition assessed and recommended  Colonoscopy not indicated Recommended  yearly ophthalmology/optometry visit for glaucoma screening and checkup Recommended yearly dental visit for hygiene and checkup Advanced directives - not indicated  Conditions/risks identified: BMI: Discussed weight loss, diet, and increase physical activity.  Increase physical activity: AHA recommends 150 minutes of physical activity a week.  Medications reviewed Diabetes is at goal, ACE/ARB therapy: No, Reason not on Ace Inhibitor/ARB therapy:  not indicated Urinary Incontinence is not an issue: discussed non pharmacology and pharmacology options.  Fall risk: low- discussed PT, home fall assessment, medications.   Subjective:    Janet Reeves is a 81 y.o.  female who presents for Medicare Annual Wellness Visit and complete physical.  Date of last medicare wellness visit is unknown.  She has had elevated blood pressure. Her blood pressure has been controlled at home, & today their BP is BP: 138/86 mmHg She does not workout. She denies chest pain, shortness of breath, dizziness.  She is on cholesterol medication and denies myalgias. Her cholesterol is not at goal. The cholesterol last visit was:  Lab Results  Component Value Date   CHOL 222* 01/22/2015   HDL 35* 01/22/2015   LDLCALC 141* 01/22/2015   TRIG 229* 01/22/2015   CHOLHDL 6.3 01/22/2015    She has had prediabetes for 4 years (2012). She has not been working on diet and exercise for prediabetes, and denies foot ulcerations, hyperglycemia, hypoglycemia , increased appetite, nausea, paresthesia of the feet, polydipsia, polyuria, visual disturbances, vomiting and weight loss. Last A1C in the office was:  Lab Results  Component Value Date   HGBA1C 6.1* 01/22/2015    Patient is on Vitamin D supplement.   Lab Results  Component Value Date   VD25OH 34 01/22/2015      She also complains of left sided achey  back pain that is constant.  She feels that there are no aggravating factors.  She has been taking Aleve at home which  helps to take the edge off the pain.  She reports that it is still there though.  She reports no injury that she can think of.  She reports that this is consistent with her prior back pain.  She feels that this is arthritis.  She denies any loss of bowel or bladder, or saddle anesthesias.      Names of Other Physician/Practitioners you currently use: 1.  [facility] here for primary care 2. [place], eye doctor, last visit 2015 3. No dentist, dentist, last visit several years.  Patient has dentures.   Patient Care Team: Unk Pinto, MD as PCP - General (Internal Medicine)  Medication Review: Current Outpatient Prescriptions on File Prior to Visit  Medication Sig Dispense Refill  . aspirin 81 MG tablet Take 81 mg by mouth daily.    . Cholecalciferol (VITAMIN D3) 5000 UNITS CAPS Take 5,000 Units by mouth daily.    Marland Kitchen ezetimibe (ZETIA) 10 MG tablet Take 1 tablet (10 mg total) by mouth daily. 14 tablet 0  . levothyroxine (SYNTHROID) 150 MCG tablet Take 1 tablet (150 mcg total) by mouth daily. 30 tablet 11  . Omega-3 Fatty Acids (FISH OIL) 1000 MG CAPS Take 1,000 mg by mouth daily.     . ranitidine (ZANTAC) 300 MG tablet take 1 tablet by mouth twice a day 180 tablet 11   No current facility-administered medications on file prior to visit.    Current Problems (verified) Patient Active Problem List   Diagnosis Date Noted  . Medication management 03/29/2014  . GERD 03/29/2014  . Hyperlipidemia 10/01/2013  . Prediabetes 10/01/2013  . Vitamin D deficiency 10/01/2013  . Hypothyroidism 10/01/2013  . Breast cancer 10/01/2013  . Lymphoma 10/01/2013  . Essential hypertension 09/30/2013    Screening Tests Health Maintenance  Topic Date Due  . ZOSTAVAX  12/24/1990  . DEXA SCAN  12/25/1995  . COLONOSCOPY  06/21/2011  . PNA vac Low Risk Adult (2 of 2 - PCV13) 01/26/2012  . INFLUENZA VACCINE  06/09/2015  . TETANUS/TDAP  06/06/2017    Immunization  History  Administered Date(s) Administered  . DTaP 06/07/2007  . H1N1 10/15/2008  . Influenza Whole 08/30/2013  . Influenza, High Dose Seasonal PF 10/08/2014  . Pneumococcal Polysaccharide-23 01/26/2011    Preventative care: Last colonoscopy: 2002  History reviewed: allergies, current medications, past family history, past medical history, past social history, past surgical history and problem list  Risk Factors: Tobacco History  Substance Use Topics  . Smoking status: Never Smoker   . Smokeless tobacco: Not on file  . Alcohol Use: No   She does not smoke.  Patient is a former smoker. Are there smokers in your home (other than you)?  No Alcohol Current alcohol use: none  Caffeine Current caffeine use: coffee 3 /day  Exercise Current exercise: walking  Nutrition/Diet Current diet: fair  Cardiac risk factors: advanced age (older than 63 for men, 24 for women), diabetes mellitus, dyslipidemia, hypertension, obesity (BMI >= 30 kg/m2) and sedentary lifestyle.  Depression Screen (Note: if answer to either of the following is "Yes", a more complete depression screening is indicated)   Q1: Over the past two weeks, have you felt down, depressed or hopeless? No  Q2: Over the past two weeks, have you felt little interest or pleasure in doing things? No  Have you lost  interest or pleasure in daily life? No  Do you often feel hopeless? No  Do you cry easily over simple problems? No  Activities of Daily Living In your present state of health, do you have any difficulty performing the following activities?:  Driving? No Managing money?  No Feeding yourself? No Getting from bed to chair? No Climbing a flight of stairs? No Preparing food and eating?: No Bathing or showering? No Getting dressed: No Getting to the toilet? No Using the toilet:No Moving around from place to place: No In the past year have you fallen or had a near fall?:No   Are you sexually active?  No  Do  you have more than one partner?  No  Vision Difficulties: No  Hearing Difficulties: No Do you often ask people to speak up or repeat themselves? No Do you experience ringing or noises in your ears? No Do you have difficulty understanding soft or whispered voices? Sometimes.  Cognition  Do you feel that you have a problem with memory?No  Do you often misplace items? No  Do you feel safe at home?  Yes  Advanced directives Does patient have a [place]? Yes Does patient have a Living Will? Yes  Past Medical History  Diagnosis Date  . Diabetes mellitus     Diet controlled  . Hypertension   . Hypothyroid   . GERD (gastroesophageal reflux disease)   . Lymphoma     Chemotherapy  . Breast cancer     Mastectomy and chemotherapy  . Hyperlipidemia    Past Surgical History  Procedure Laterality Date  . Mastectomy    . Tonsillectomy    . Cholecystectomy    . Appendectomy      Review of Systems  Constitutional: Negative for fever, chills, weight loss and malaise/fatigue.  HENT: Negative for congestion, ear pain, nosebleeds and sore throat.   Eyes: Negative.   Respiratory: Negative for cough, sputum production and shortness of breath.   Cardiovascular: Negative for chest pain, palpitations, claudication and leg swelling.  Genitourinary: Positive for flank pain. Negative for dysuria, urgency, frequency and hematuria.  Musculoskeletal: Positive for back pain. Negative for myalgias, falls and neck pain.  Skin: Negative.   Neurological: Negative for dizziness, sensory change, focal weakness, weakness and headaches.     Objective:     BP 138/86 mmHg  Pulse 70  Temp(Src) 98.2 F (36.8 C) (Temporal)  Resp 16  Ht 5\' 3"  (1.6 m)  Wt 190 lb (86.183 kg)  BMI 33.67 kg/m2  General Appearance: Well nourished, alert, WD/WN, femaleand in no apparent distress. Eyes: PERRLA, EOMs, conjunctiva no swelling or erythema, normal fundi and vessels. Sinuses: No  frontal/maxillary tenderness ENT/Mouth: EACs patent / TMs  nl. Nares clear without erythema, swelling, mucoid exudates. Oral hygiene is good. No erythema, swelling, or exudate. Tongue normal, non-obstructing. Tonsils not swollen or erythematous. Hearing normal.  Neck: Supple, thyroid normal. No bruits, nodes or JVD. Respiratory: Respiratory effort normal.  BS equal and clear bilateral without rales, rhonci, wheezing or stridor. Cardio: Heart sounds are normal with regular rate and rhythm and no murmurs, rubs or gallops. Peripheral pulses are normal and equal bilaterally without edema. No aortic or femoral bruits. Chest: symmetric with normal excursions and percussion. Breasts: Symmetric, without lumps, nipple discharge, retractions, or fibrocystic changes.  Abdomen: Flat, soft  with nl bowel sounds. Nontender, no guarding, rebound, hernias, masses, or organomegaly.  Lymphatics: Non tender without lymphadenopathy.  Genitourinary:  Musculoskeletal: Full ROM all peripheral  extremities, joint stability, 5/5 strength, and normal gait. Patient rises slowly from sitting to standing.  They walk without an antalgic gait.  There is no evidence of erythema, ecchymosis, or gross deformity.  There is tenderness to palpation over left thoracic paraspinal muscles.  Active ROM is full.  Sensation to light touch is intact over all extremities.  Strength is symmetric and equal in all extremities.  Skin: Warm and dry without rashes, lesions, cyanosis, clubbing or  ecchymosis.  Neuro: Cranial nerves intact, reflexes equal bilaterally. Normal muscle tone, no cerebellar symptoms. Sensation intact.  Pysch: Awake and oriented X 3, normal affect, Insight and Judgment appropriate.   Cognitive Testing  Alert? Yes  Normal Appearance?Yes  Oriented to person? Yes  Place? Yes   Time? Yes  Recall of three objects?  Yes  Can perform simple calculations? Yes  Displays appropriate judgment? Yes  Can read the correct time from a  watch/clock?Yes  Medicare Attestation I have personally reviewed: The patient's medical and social history Their use of alcohol, tobacco or illicit drugs Their current medications and supplements The patient's functional ability including ADLs,fall risks, home safety risks, cognitive, and hearing and visual impairment Diet and physical activities Evidence for depression or mood disorders  The patient's weight, height, BMI, and visual acuity have been recorded in the chart.  I have made referrals, counseling, and provided education to the patient based on review of the above and I have provided the patient with a written personalized care plan for preventive services.  Over 40 minutes of exam, counseling, chart review was performed.   Starlyn Skeans, PA-C   02/07/2015

## 2015-02-20 ENCOUNTER — Ambulatory Visit: Payer: Self-pay | Admitting: Internal Medicine

## 2015-04-09 ENCOUNTER — Ambulatory Visit (INDEPENDENT_AMBULATORY_CARE_PROVIDER_SITE_OTHER): Payer: Medicare Other | Admitting: Physician Assistant

## 2015-04-09 ENCOUNTER — Encounter: Payer: Self-pay | Admitting: Physician Assistant

## 2015-04-09 VITALS — BP 110/72 | HR 80 | Temp 98.1°F | Resp 16 | Ht 63.0 in | Wt 187.0 lb

## 2015-04-09 DIAGNOSIS — R5383 Other fatigue: Secondary | ICD-10-CM | POA: Diagnosis not present

## 2015-04-09 DIAGNOSIS — R350 Frequency of micturition: Secondary | ICD-10-CM

## 2015-04-09 DIAGNOSIS — F32A Depression, unspecified: Secondary | ICD-10-CM

## 2015-04-09 DIAGNOSIS — F329 Major depressive disorder, single episode, unspecified: Secondary | ICD-10-CM | POA: Diagnosis not present

## 2015-04-09 DIAGNOSIS — E039 Hypothyroidism, unspecified: Secondary | ICD-10-CM

## 2015-04-09 LAB — BASIC METABOLIC PANEL WITH GFR
BUN: 10 mg/dL (ref 6–23)
CHLORIDE: 104 meq/L (ref 96–112)
CO2: 28 mEq/L (ref 19–32)
Calcium: 9.4 mg/dL (ref 8.4–10.5)
Creat: 1.07 mg/dL (ref 0.50–1.10)
GFR, EST AFRICAN AMERICAN: 55 mL/min — AB
GFR, EST NON AFRICAN AMERICAN: 48 mL/min — AB
Glucose, Bld: 104 mg/dL — ABNORMAL HIGH (ref 70–99)
Potassium: 4.2 mEq/L (ref 3.5–5.3)
SODIUM: 141 meq/L (ref 135–145)

## 2015-04-09 LAB — CBC WITH DIFFERENTIAL/PLATELET
Basophils Absolute: 0.1 10*3/uL (ref 0.0–0.1)
Basophils Relative: 1 % (ref 0–1)
Eosinophils Absolute: 0.1 10*3/uL (ref 0.0–0.7)
Eosinophils Relative: 1 % (ref 0–5)
HCT: 40.4 % (ref 36.0–46.0)
Hemoglobin: 13.6 g/dL (ref 12.0–15.0)
Lymphocytes Relative: 16 % (ref 12–46)
Lymphs Abs: 1.3 10*3/uL (ref 0.7–4.0)
MCH: 30.1 pg (ref 26.0–34.0)
MCHC: 33.7 g/dL (ref 30.0–36.0)
MCV: 89.4 fL (ref 78.0–100.0)
MPV: 10.2 fL (ref 8.6–12.4)
Monocytes Absolute: 0.6 10*3/uL (ref 0.1–1.0)
Monocytes Relative: 7 % (ref 3–12)
Neutro Abs: 6.3 10*3/uL (ref 1.7–7.7)
Neutrophils Relative %: 75 % (ref 43–77)
Platelets: 382 10*3/uL (ref 150–400)
RBC: 4.52 MIL/uL (ref 3.87–5.11)
RDW: 14.6 % (ref 11.5–15.5)
WBC: 8.4 10*3/uL (ref 4.0–10.5)

## 2015-04-09 LAB — HEPATIC FUNCTION PANEL
ALBUMIN: 4.1 g/dL (ref 3.5–5.2)
ALT: 9 U/L (ref 0–35)
AST: 19 U/L (ref 0–37)
Alkaline Phosphatase: 65 U/L (ref 39–117)
BILIRUBIN INDIRECT: 0.6 mg/dL (ref 0.2–1.2)
Bilirubin, Direct: 0.2 mg/dL (ref 0.0–0.3)
Total Bilirubin: 0.8 mg/dL (ref 0.2–1.2)
Total Protein: 6.7 g/dL (ref 6.0–8.3)

## 2015-04-09 LAB — TSH: TSH: 17.915 u[IU]/mL — AB (ref 0.350–4.500)

## 2015-04-09 MED ORDER — CITALOPRAM HYDROBROMIDE 10 MG PO TABS: 10.0000 mg | ORAL_TABLET | Freq: Every day | ORAL | Status: DC

## 2015-04-09 NOTE — Patient Instructions (Signed)
Hypothyroidism The thyroid is a large gland located in the lower front of your neck. The thyroid gland helps control metabolism. Metabolism is how your body handles food. It controls metabolism with the hormone thyroxine. When this gland is underactive (hypothyroid), it produces too little hormone.  CAUSES These include:   Absence or destruction of thyroid tissue.  Goiter due to iodine deficiency.  Goiter due to medications.  Congenital defects (since birth).  Problems with the pituitary. This causes a lack of TSH (thyroid stimulating hormone). This hormone tells the thyroid to turn out more hormone. SYMPTOMS  Lethargy (feeling as though you have no energy)  Cold intolerance  Weight gain (in spite of normal food intake)  Dry skin  Coarse hair  Menstrual irregularity (if severe, may lead to infertility)  Slowing of thought processes Cardiac problems are also caused by insufficient amounts of thyroid hormone. Hypothyroidism in the newborn is cretinism, and is an extreme form. It is important that this form be treated adequately and immediately or it will lead rapidly to retarded physical and mental development. DIAGNOSIS  To prove hypothyroidism, your caregiver may do blood tests and ultrasound tests. Sometimes the signs are hidden. It may be necessary for your caregiver to watch this illness with blood tests either before or after diagnosis and treatment. TREATMENT  Low levels of thyroid hormone are increased by using synthetic thyroid hormone. This is a safe, effective treatment. It usually takes about four weeks to gain the full effects of the medication. After you have the full effect of the medication, it will generally take another four weeks for problems to leave. Your caregiver may start you on low doses. If you have had heart problems the dose may be gradually increased. It is generally not an emergency to get rapidly to normal. HOME CARE INSTRUCTIONS   Take your  medications as your caregiver suggests. Let your caregiver know of any medications you are taking or start taking. Your caregiver will help you with dosage schedules.  As your condition improves, your dosage needs may increase. It will be necessary to have continuing blood tests as suggested by your caregiver.  Report all suspected medication side effects to your caregiver. SEEK MEDICAL CARE IF: Seek medical care if you develop:  Sweating.  Tremulousness (tremors).  Anxiety.  Rapid weight loss.  Heat intolerance.  Emotional swings.  Diarrhea.  Weakness. SEEK IMMEDIATE MEDICAL CARE IF:  You develop chest pain, an irregular heart beat (palpitations), or a rapid heart beat. MAKE SURE YOU:   Understand these instructions.  Will watch your condition.  Will get help right away if you are not doing well or get worse. Document Released: 10/25/2005 Document Revised: 01/17/2012 Document Reviewed: 06/14/2008 ExitCare Patient Information 2015 [employer]. This information is not intended to replace advice given to you by your health care provider. Make sure you discuss any questions you have with your health care provider.  

## 2015-04-09 NOTE — Progress Notes (Addendum)
Subjective:    Patient ID: Janet Reeves, female    DOB: [birth year], 81 y.o.   MRN: [id number]  HPI 81 y.o. female presents due to depression/worsening confusion. She had a recent move to a new house but states that she has been feeling very depressed for sometime. She states she does not have a schedule, does not have motivation to shower or do anything. She will go to church. Her daughter in law is here and states she has had some confusion as well.   She is on thyroid medication. Her medication was not changed last visit, she is on 161mcg but daughter in law states she may not be taking it at this time.    Lab Results  Component Value Date   TSH 21.138* 01/22/2015     Current Outpatient Prescriptions on File Prior to Visit  Medication Sig Dispense Refill  . aspirin 81 MG tablet Take 81 mg by mouth daily.    . Cholecalciferol (VITAMIN D3) 5000 UNITS CAPS Take 5,000 Units by mouth daily.    . diazepam (VALIUM) 5 MG tablet Take 0.5 tablets (2.5 mg total) by mouth every 6 (six) hours as needed for anxiety (spasms). 20 tablet 0  . ezetimibe (ZETIA) 10 MG tablet Take 1 tablet (10 mg total) by mouth daily. 14 tablet 0  . levothyroxine (SYNTHROID) 150 MCG tablet Take 1 tablet (150 mcg total) by mouth daily. 30 tablet 11  . Omega-3 Fatty Acids (FISH OIL) 1000 MG CAPS Take 1,000 mg by mouth daily.     . ranitidine (ZANTAC) 300 MG tablet take 1 tablet by mouth twice a day 180 tablet 11   No current facility-administered medications on file prior to visit.   Past Medical History  Diagnosis Date  . Diabetes mellitus     Diet controlled  . Hypertension   . Hypothyroid   . GERD (gastroesophageal reflux disease)   . Lymphoma     Chemotherapy  . Breast cancer     Mastectomy and chemotherapy  . Hyperlipidemia     Review of Systems  Constitutional: Positive for activity change and fatigue. Negative for fever, chills, diaphoresis, appetite change and unexpected weight change.  HENT:  Negative.   Respiratory: Negative.   Cardiovascular: Positive for leg swelling. Negative for chest pain and palpitations.  Gastrointestinal: Negative.   Genitourinary: Positive for frequency. Negative for dysuria, urgency, hematuria, flank pain, decreased urine volume, vaginal bleeding, vaginal discharge, enuresis, difficulty urinating, genital sores, menstrual problem, pelvic pain and dyspareunia.  Musculoskeletal: Negative.   Neurological: Negative.   Psychiatric/Behavioral: Positive for dysphoric mood and decreased concentration. Negative for suicidal ideas, hallucinations, behavioral problems, confusion, sleep disturbance, self-injury and agitation. The patient is not nervous/anxious and is not hyperactive.        Objective:   Physical Exam HEENT: normocephalic, sclerae anicteric, TMs pearly, nares patent, no discharge or erythema, pharynx normal Oral cavity: MMM, no lesions Neck: supple, no lymphadenopathy, no thyromegaly, no masses Heart: RRR, normal S1, S2, no murmurs Lungs: CTA bilaterally, no wheezes, rhonchi, or rales Abdomen: +bs, soft, obese, non tender, non distended, no masses, no hepatomegaly, no splenomegaly Musculoskeletal: nontender, no swelling, no obvious deformity Extremities: 1+ edema bilateral, no cyanosis, no clubbing Pulses: 2+ symmetric, upper and lower extremities, normal cap refill Neurological: alert, oriented x 3, CN2-12 intact, strength normal upper extremities and lower extremities,, DTRs 2+ throughout, no cerebellar signs, gait normal Psychiatric: normal affect, behavior normal, pleasant       Assessment & Plan:  Depression- will treat with celexa 10mg , will recheck thyroid and urine.  Hypothyroidism-check TSH level, continue medications the same, reminded to take on an empty stomach 30-20mins before food.  Urinary frequency- check urine Medication management- check CBC, BMP, LFTs  Addendum: Will make sure she is on thyroid and recheck 1 month.  +  nitrate and bacteria in urine with confusion/depression- will treat with low dose cipro and recheck urine/C&S 1 month.

## 2015-04-10 LAB — URINALYSIS, ROUTINE W REFLEX MICROSCOPIC
Bilirubin Urine: NEGATIVE
Glucose, UA: NEGATIVE mg/dL
Hgb urine dipstick: NEGATIVE
Ketones, ur: NEGATIVE mg/dL
NITRITE: POSITIVE — AB
PROTEIN: NEGATIVE mg/dL
Specific Gravity, Urine: <1.005 — ABNORMAL LOW (ref 1.005–1.030)
Urobilinogen, UA: 0.2 mg/dL (ref 0.0–1.0)
pH: 6 (ref 5.0–8.0)

## 2015-04-10 LAB — URINALYSIS, MICROSCOPIC ONLY
CRYSTALS: NONE SEEN
Casts: NONE SEEN
SQUAMOUS EPITHELIAL / LPF: NONE SEEN

## 2015-04-11 LAB — URINE CULTURE: Colony Count: >=100000

## 2015-04-11 MED ORDER — CIPROFLOXACIN HCL 250 MG PO TABS: 250.0000 mg | ORAL_TABLET | Freq: Two times a day (BID) | ORAL | Status: AC

## 2015-04-11 NOTE — Addendum Note (Signed)
Addended by: Vicie Mutters R on: 04/11/2015 08:09 AM   Modules accepted: Orders

## 2015-04-28 ENCOUNTER — Ambulatory Visit: Payer: Self-pay | Admitting: Internal Medicine

## 2015-05-07 ENCOUNTER — Ambulatory Visit (INDEPENDENT_AMBULATORY_CARE_PROVIDER_SITE_OTHER): Payer: Medicare Other | Admitting: Physician Assistant

## 2015-05-07 ENCOUNTER — Encounter: Payer: Self-pay | Admitting: Physician Assistant

## 2015-05-07 VITALS — BP 132/80 | HR 76 | Temp 97.9°F | Resp 16 | Ht 63.0 in | Wt 183.0 lb

## 2015-05-07 DIAGNOSIS — Z79899 Other long term (current) drug therapy: Secondary | ICD-10-CM

## 2015-05-07 DIAGNOSIS — E782 Mixed hyperlipidemia: Secondary | ICD-10-CM | POA: Diagnosis not present

## 2015-05-07 DIAGNOSIS — F32A Depression, unspecified: Secondary | ICD-10-CM

## 2015-05-07 DIAGNOSIS — E039 Hypothyroidism, unspecified: Secondary | ICD-10-CM | POA: Diagnosis not present

## 2015-05-07 DIAGNOSIS — I10 Essential (primary) hypertension: Secondary | ICD-10-CM | POA: Diagnosis not present

## 2015-05-07 DIAGNOSIS — R350 Frequency of micturition: Secondary | ICD-10-CM | POA: Diagnosis not present

## 2015-05-07 DIAGNOSIS — R7309 Other abnormal glucose: Secondary | ICD-10-CM | POA: Diagnosis not present

## 2015-05-07 DIAGNOSIS — E559 Vitamin D deficiency, unspecified: Secondary | ICD-10-CM

## 2015-05-07 DIAGNOSIS — F329 Major depressive disorder, single episode, unspecified: Secondary | ICD-10-CM

## 2015-05-07 DIAGNOSIS — R7303 Prediabetes: Secondary | ICD-10-CM

## 2015-05-07 NOTE — Patient Instructions (Signed)
I think it is possible that you have sleep apnea. It can cause interrupted sleep, headaches, frequent awakenings, fatigue, dry mouth, fast/slow heart beats, memory issues, anxiety/depression, swelling, numbness tingling hands/feet, weight gain, shortness of breath, and the list goes on. Sleep apnea needs to be ruled out because if it is left untreated it does eventually lead to abnormal heart beats, lung failure or heart failure as well as increasing the risk of heart attack and stroke. There are masks you can wear OR a mouth piece that I can give you information about. Often times though people feel MUCH better after getting treatment.   Sleep Apnea  Sleep apnea is a sleep disorder characterized by abnormal pauses in breathing while you sleep. When your breathing pauses, the level of oxygen in your blood decreases. This causes you to move out of deep sleep and into light sleep. As a result, your quality of sleep is poor, and the system that carries your blood throughout your body (cardiovascular system) experiences stress. If sleep apnea remains untreated, the following conditions can develop:  High blood pressure (hypertension).  Coronary artery disease.  Inability to achieve or maintain an erection (impotence).  Impairment of your thought process (cognitive dysfunction). There are three types of sleep apnea: 1. Obstructive sleep apnea--Pauses in breathing during sleep because of a blocked airway. 2. Central sleep apnea--Pauses in breathing during sleep because the area of the brain that controls your breathing does not send the correct signals to the muscles that control breathing. 3. Mixed sleep apnea--A combination of both obstructive and central sleep apnea.  RISK FACTORS The following risk factors can increase your risk of developing sleep apnea:  Being overweight.  Smoking.  Having narrow passages in your nose and throat.  Being of older age.  Being female.  Alcohol use.   Sedative and tranquilizer use.  Ethnicity. Among individuals younger than 35 years, African Americans are at increased risk of sleep apnea. SYMPTOMS   Difficulty staying asleep.  Daytime sleepiness and fatigue.  Loss of energy.  Irritability.  Loud, heavy snoring.  Morning headaches.  Trouble concentrating.  Forgetfulness.  Decreased interest in sex. DIAGNOSIS  In order to diagnose sleep apnea, your caregiver will perform a physical examination. Your caregiver may suggest that you take a home sleep test. Your caregiver may also recommend that you spend the night in a sleep lab. In the sleep lab, several monitors record information about your heart, lungs, and brain while you sleep. Your leg and arm movements and blood oxygen level are also recorded. TREATMENT The following actions may help to resolve mild sleep apnea:  Sleeping on your side.   Using a decongestant if you have nasal congestion.   Avoiding the use of depressants, including alcohol, sedatives, and narcotics.   Losing weight and modifying your diet if you are overweight. There also are devices and treatments to help open your airway:  Oral appliances. These are custom-made mouthpieces that shift your lower jaw forward and slightly open your bite. This opens your airway.  Devices that create positive airway pressure. This positive pressure "splints" your airway open to help you breathe better during sleep. The following devices create positive airway pressure:  Continuous positive airway pressure (CPAP) device. The CPAP device creates a continuous level of air pressure with an air pump. The air is delivered to your airway through a mask while you sleep. This continuous pressure keeps your airway open.  Nasal expiratory positive airway pressure (EPAP) device. The EPAP device  creates positive air pressure as you exhale. The device consists of single-use valves, which are inserted into each nostril and held in  place by adhesive. The valves create very little resistance when you inhale but create much more resistance when you exhale. That increased resistance creates the positive airway pressure. This positive pressure while you exhale keeps your airway open, making it easier to breath when you inhale again.  Bilevel positive airway pressure (BPAP) device. The BPAP device is used mainly in patients with central sleep apnea. This device is similar to the CPAP device because it also uses an air pump to deliver continuous air pressure through a mask. However, with the BPAP machine, the pressure is set at two different levels. The pressure when you exhale is lower than the pressure when you inhale.  Surgery. Typically, surgery is only done if you cannot comply with less invasive treatments or if the less invasive treatments do not improve your condition. Surgery involves removing excess tissue in your airway to create a wider passage way. Document Released: 10/15/2002 Document Revised: 02/19/2013 Document Reviewed: 03/02/2012 [facility] Patient Information 2015 [place], [place]. This information is not intended to replace advice given to you by your health care provider. Make sure you discuss any questions you have with your health care provider.     Hypothyroidism The thyroid is a large gland located in the lower front of your neck. The thyroid gland helps control metabolism. Metabolism is how your body handles food. It controls metabolism with the hormone thyroxine. When this gland is underactive (hypothyroid), it produces too little hormone.  CAUSES These include:   Absence or destruction of thyroid tissue.  Goiter due to iodine deficiency.  Goiter due to medications.  Congenital defects (since birth).  Problems with the pituitary. This causes a lack of TSH (thyroid stimulating hormone). This hormone tells the thyroid to turn out more hormone. SYMPTOMS  Lethargy (feeling as though you have no  energy)  Cold intolerance  Weight gain (in spite of normal food intake)  Dry skin  Coarse hair  Menstrual irregularity (if severe, may lead to infertility)  Slowing of thought processes Cardiac problems are also caused by insufficient amounts of thyroid hormone. Hypothyroidism in the newborn is cretinism, and is an extreme form. It is important that this form be treated adequately and immediately or it will lead rapidly to retarded physical and mental development. DIAGNOSIS  To prove hypothyroidism, your caregiver may do blood tests and ultrasound tests. Sometimes the signs are hidden. It may be necessary for your caregiver to watch this illness with blood tests either before or after diagnosis and treatment. TREATMENT  Low levels of thyroid hormone are increased by using synthetic thyroid hormone. This is a safe, effective treatment. It usually takes about four weeks to gain the full effects of the medication. After you have the full effect of the medication, it will generally take another four weeks for problems to leave. Your caregiver may start you on low doses. If you have had heart problems the dose may be gradually increased. It is generally not an emergency to get rapidly to normal. HOME CARE INSTRUCTIONS   Take your medications as your caregiver suggests. Let your caregiver know of any medications you are taking or start taking. Your caregiver will help you with dosage schedules.  As your condition improves, your dosage needs may increase. It will be necessary to have continuing blood tests as suggested by your caregiver.  Report all suspected medication side effects  to your caregiver. SEEK MEDICAL CARE IF: Seek medical care if you develop:  Sweating.  Tremulousness (tremors).  Anxiety.  Rapid weight loss.  Heat intolerance.  Emotional swings.  Diarrhea.  Weakness. SEEK IMMEDIATE MEDICAL CARE IF:  You develop chest pain, an irregular heart beat (palpitations), or  a rapid heart beat. MAKE SURE YOU:   Understand these instructions.  Will watch your condition.  Will get help right away if you are not doing well or get worse. Document Released: 10/25/2005 Document Revised: 01/17/2012 Document Reviewed: 06/14/2008 [facility] Patient Information 2015 [place], [place]. This information is not intended to replace advice given to you by your health care provider. Make sure you discuss any questions you have with your health care provider.

## 2015-05-07 NOTE — Progress Notes (Signed)
Assessment and Plan:  1. Hypertension -Continue medication, monitor blood pressure at home. Continue DASH diet.  Reminder to go to the ER if any CP, SOB, nausea, dizziness, severe HA, changes vision/speech, left arm numbness and tingling and jaw pain.  2. Cholesterol -Continue diet and exercise. Check cholesterol.   3. Prediabetes  -Continue diet and exercise. Check A1C  4. Vitamin D Def - check level and continue medications.   5. Hypothyroidism -check TSH level, continue medications the same, reminded to take on an empty stomach 30-16mins before food.   6. Recheck urine  7. Memory issues Likely due to age related, will double check TSH, urine, and with snoring, crowded mouth and will wake herself up suggest sleep study, declines at this time.   Continue diet and meds as discussed. Further disposition pending results of labs. Over 30 minutes of exam, counseling, chart review, and critical decision making was performed  HPI 81 y.o. female  presents for 3 month follow up on hypertension, cholesterol, prediabetes, and vitamin D deficiency.   Her blood pressure has been controlled at home, today their BP is BP: 132/80 mmHg  She does not workout. She denies chest pain, shortness of breath, dizziness.  She is on cholesterol medication and denies myalgias. Her cholesterol is not at goal. The cholesterol last visit was:   Lab Results  Component Value Date   CHOL 222* 01/22/2015   HDL 35* 01/22/2015   LDLCALC 141* 01/22/2015   TRIG 229* 01/22/2015   CHOLHDL 6.3 01/22/2015    She has been working on diet and exercise for prediabetes, and denies paresthesia of the feet, polydipsia, polyuria and visual disturbances. Last A1C in the office was:  Lab Results  Component Value Date   HGBA1C 6.1* 01/22/2015   Patient is on Vitamin D supplement.   Lab Results  Component Value Date   VD25OH 34 01/22/2015     She is on thyroid medication. Her medication was changed last visit, she was put  on 150 mcg, has been taking it twice a day when she remembers due to misunderstanding.  Will try to make it to the dosage is one time a day. She states her memory is much better.  Lab Results  Component Value Date   TSH 17.915* 04/09/2015  .  She was given ABX for possible UTI, did not culture but had nitrates and bacteria.  She is on celexa 10 mg which she states is helping. Her daughter is with her and states her memory is still bad with short term but has improved.    Current Medications:  Current Outpatient Prescriptions on File Prior to Visit  Medication Sig Dispense Refill  . aspirin 81 MG tablet Take 81 mg by mouth daily.    . Cholecalciferol (VITAMIN D3) 5000 UNITS CAPS Take 5,000 Units by mouth daily.    . citalopram (CELEXA) 10 MG tablet Take 1 tablet (10 mg total) by mouth daily. 30 tablet 2  . diazepam (VALIUM) 5 MG tablet Take 0.5 tablets (2.5 mg total) by mouth every 6 (six) hours as needed for anxiety (spasms). 20 tablet 0  . ezetimibe (ZETIA) 10 MG tablet Take 1 tablet (10 mg total) by mouth daily. 14 tablet 0  . levothyroxine (SYNTHROID) 150 MCG tablet Take 1 tablet (150 mcg total) by mouth daily. 30 tablet 11  . Omega-3 Fatty Acids (FISH OIL) 1000 MG CAPS Take 1,000 mg by mouth daily.     . ranitidine (ZANTAC) 300 MG tablet take 1  tablet by mouth twice a day 180 tablet 11   No current facility-administered medications on file prior to visit.   Medical History:  Past Medical History  Diagnosis Date  . Diabetes mellitus     Diet controlled  . Hypertension   . Hypothyroid   . GERD (gastroesophageal reflux disease)   . Lymphoma     Chemotherapy  . Breast cancer     Mastectomy and chemotherapy  . Hyperlipidemia    Allergies:  Allergies  Allergen Reactions  . Codeine Nausea And Vomiting  . Macrobid [Nitrofurantoin Macrocrystal] Other (See Comments)    Dizziness.     Review of Systems:  Review of Systems  Constitutional: Negative for fever, chills and  diaphoresis. Malaise/fatigue:  snoring, will wake herself up.  HENT: Negative.   Respiratory: Negative.   Cardiovascular: Negative for chest pain, palpitations and leg swelling.  Gastrointestinal: Negative.   Genitourinary: Negative for dysuria, urgency, frequency, hematuria and flank pain.  Musculoskeletal: Negative.   Neurological: Negative.   Psychiatric/Behavioral: Positive for depression (better) and memory loss (somewhat improved but short term memory still poor). Negative for suicidal ideas and hallucinations. The patient is not nervous/anxious.     Family history- Review and unchanged Social history- Review and unchanged Physical Exam: BP 132/80 mmHg  Pulse 76  Temp(Src) 97.9 F (36.6 C)  Resp 16  Ht 5\' 3"  (1.6 m)  Wt 183 lb (83.008 kg)  BMI 32.43 kg/m2 Wt Readings from Last 3 Encounters:  05/07/15 183 lb (83.008 kg)  04/09/15 187 lb (84.823 kg)  02/07/15 190 lb (86.183 kg)   HEENT: normocephalic, sclerae anicteric, TMs pearly, nares patent, no discharge or erythema, pharynx normal Oral cavity: MMM, no lesions Neck: supple, no lymphadenopathy, no thyromegaly, no masses Heart: RRR, normal S1, S2, no murmurs Lungs: CTA bilaterally, no wheezes, rhonchi, or rales Abdomen: +bs, soft, obese, non tender, non distended, no masses, no hepatomegaly, no splenomegaly Musculoskeletal: nontender, no swelling, no obvious deformity Extremities: minimal edema bilateral, no cyanosis, no clubbing Pulses: 2+ symmetric, upper and lower extremities, normal cap refill Neurological: alert, oriented x 3, CN2-12 intact, strength normal upper extremities and lower extremities,, DTRs 2+ throughout, no cerebellar signs, gait slow Psychiatric: normal affect, behavior normal, pleasant   Vicie Mutters, PA-C 3:03 PM [facility]

## 2015-05-08 LAB — CBC WITH DIFFERENTIAL/PLATELET
Basophils Absolute: 0.1 10*3/uL (ref 0.0–0.1)
Basophils Relative: 1 % (ref 0–1)
EOS ABS: 0.1 10*3/uL (ref 0.0–0.7)
EOS PCT: 1 % (ref 0–5)
HCT: 43.8 % (ref 36.0–46.0)
Hemoglobin: 14.2 g/dL (ref 12.0–15.0)
LYMPHS ABS: 1.5 10*3/uL (ref 0.7–4.0)
Lymphocytes Relative: 19 % (ref 12–46)
MCH: 29 pg (ref 26.0–34.0)
MCHC: 32.4 g/dL (ref 30.0–36.0)
MCV: 89.4 fL (ref 78.0–100.0)
MONOS PCT: 6 % (ref 3–12)
MPV: 10.2 fL (ref 8.6–12.4)
Monocytes Absolute: 0.5 10*3/uL (ref 0.1–1.0)
NEUTROS ABS: 5.6 10*3/uL (ref 1.7–7.7)
NEUTROS PCT: 73 % (ref 43–77)
Platelets: 378 10*3/uL (ref 150–400)
RBC: 4.9 MIL/uL (ref 3.87–5.11)
RDW: 13.6 % (ref 11.5–15.5)
WBC: 7.7 10*3/uL (ref 4.0–10.5)

## 2015-05-08 LAB — HEPATIC FUNCTION PANEL
ALT: 9 U/L (ref 0–35)
AST: 15 U/L (ref 0–37)
Albumin: 4.2 g/dL (ref 3.5–5.2)
Alkaline Phosphatase: 66 U/L (ref 39–117)
Bilirubin, Direct: 0.1 mg/dL (ref 0.0–0.3)
Indirect Bilirubin: 0.6 mg/dL (ref 0.2–1.2)
Total Bilirubin: 0.7 mg/dL (ref 0.2–1.2)
Total Protein: 6.9 g/dL (ref 6.0–8.3)

## 2015-05-08 LAB — URINALYSIS, MICROSCOPIC ONLY
Casts: NONE SEEN
Crystals: NONE SEEN
Squamous Epithelial / LPF: NONE SEEN

## 2015-05-08 LAB — URINALYSIS, ROUTINE W REFLEX MICROSCOPIC
Bilirubin Urine: NEGATIVE
GLUCOSE, UA: NEGATIVE mg/dL
Hgb urine dipstick: NEGATIVE
Ketones, ur: NEGATIVE mg/dL
Nitrite: POSITIVE — AB
Protein, ur: NEGATIVE mg/dL
SPECIFIC GRAVITY, URINE: 1.011 (ref 1.005–1.030)
Urobilinogen, UA: 0.2 mg/dL (ref 0.0–1.0)
pH: 7 (ref 5.0–8.0)

## 2015-05-08 LAB — LIPID PANEL
CHOL/HDL RATIO: 4.7 ratio
Cholesterol: 215 mg/dL — ABNORMAL HIGH (ref 0–200)
HDL: 46 mg/dL (ref >=46)
LDL CALC: 135 mg/dL — AB (ref 0–99)
Triglycerides: 170 mg/dL — ABNORMAL HIGH (ref <150)
VLDL: 34 mg/dL (ref 0–40)

## 2015-05-08 LAB — BASIC METABOLIC PANEL WITH GFR
BUN: 11 mg/dL (ref 6–23)
CO2: 26 mEq/L (ref 19–32)
Calcium: 9.5 mg/dL (ref 8.4–10.5)
Chloride: 104 mEq/L (ref 96–112)
Creat: 0.91 mg/dL (ref 0.50–1.10)
GFR, Est African American: 67 mL/min
GFR, Est Non African American: 58 mL/min — ABNORMAL LOW
Glucose, Bld: 106 mg/dL — ABNORMAL HIGH (ref 70–99)
Potassium: 4.2 mEq/L (ref 3.5–5.3)
Sodium: 143 mEq/L (ref 135–145)

## 2015-05-08 LAB — HEMOGLOBIN A1C
HEMOGLOBIN A1C: 6 % — AB (ref <5.7)
Mean Plasma Glucose: 126 mg/dL — ABNORMAL HIGH (ref <117)

## 2015-05-08 LAB — VITAMIN D 25 HYDROXY (VIT D DEFICIENCY, FRACTURES): VIT D 25 HYDROXY: 41 ng/mL (ref 30–100)

## 2015-05-08 LAB — MAGNESIUM: Magnesium: 1.7 mg/dL (ref 1.5–2.5)

## 2015-05-08 LAB — TSH: TSH: 2.376 u[IU]/mL (ref 0.350–4.500)

## 2015-05-10 LAB — URINE CULTURE: Colony Count: >=100000

## 2015-05-11 MED ORDER — CIPROFLOXACIN HCL 500 MG PO TABS: 500.0000 mg | ORAL_TABLET | Freq: Two times a day (BID) | ORAL | Status: DC

## 2015-05-11 NOTE — Addendum Note (Signed)
Addended by: Vicie Mutters R on: 05/11/2015 12:31 PM   Modules accepted: Orders

## 2015-05-26 ENCOUNTER — Other Ambulatory Visit: Payer: Self-pay | Admitting: Internal Medicine

## 2015-05-26 DIAGNOSIS — F05 Delirium due to known physiological condition: Principal | ICD-10-CM

## 2015-05-26 DIAGNOSIS — R410 Disorientation, unspecified: Secondary | ICD-10-CM

## 2015-05-27 ENCOUNTER — Other Ambulatory Visit (HOSPITAL_COMMUNITY): Payer: Self-pay

## 2015-05-28 ENCOUNTER — Ambulatory Visit
Admission: RE | Admit: 2015-05-28 | Discharge: 2015-05-28 | Disposition: A | Discharge status: Home / Self Care | Payer: Medicare Other | Source: Ambulatory Visit | Attending: Internal Medicine | Admitting: Internal Medicine

## 2015-05-28 DIAGNOSIS — R410 Disorientation, unspecified: Secondary | ICD-10-CM | POA: Diagnosis not present

## 2015-05-28 DIAGNOSIS — R202 Paresthesia of skin: Secondary | ICD-10-CM | POA: Diagnosis not present

## 2015-05-28 DIAGNOSIS — F05 Delirium due to known physiological condition: Principal | ICD-10-CM

## 2015-05-28 DIAGNOSIS — Z853 Personal history of malignant neoplasm of breast: Secondary | ICD-10-CM | POA: Diagnosis not present

## 2015-05-28 DIAGNOSIS — S0990XA Unspecified injury of head, initial encounter: Secondary | ICD-10-CM | POA: Diagnosis not present

## 2015-06-03 ENCOUNTER — Encounter: Payer: Self-pay | Admitting: Internal Medicine

## 2015-06-03 ENCOUNTER — Ambulatory Visit (INDEPENDENT_AMBULATORY_CARE_PROVIDER_SITE_OTHER): Payer: Medicare Other | Admitting: Internal Medicine

## 2015-06-03 VITALS — BP 118/82 | HR 72 | Temp 97.3°F | Resp 16 | Ht 63.0 in | Wt 181.4 lb

## 2015-06-03 DIAGNOSIS — Z111 Encounter for screening for respiratory tuberculosis: Secondary | ICD-10-CM

## 2015-06-03 DIAGNOSIS — C50919 Malignant neoplasm of unspecified site of unspecified female breast: Secondary | ICD-10-CM

## 2015-06-03 DIAGNOSIS — K219 Gastro-esophageal reflux disease without esophagitis: Secondary | ICD-10-CM

## 2015-06-03 DIAGNOSIS — M7071 Other bursitis of hip, right hip: Secondary | ICD-10-CM | POA: Diagnosis not present

## 2015-06-03 DIAGNOSIS — G308 Other Alzheimer's disease: Secondary | ICD-10-CM | POA: Diagnosis not present

## 2015-06-03 DIAGNOSIS — Z6832 Body mass index (BMI) 32.0-32.9, adult: Secondary | ICD-10-CM

## 2015-06-03 DIAGNOSIS — E039 Hypothyroidism, unspecified: Secondary | ICD-10-CM

## 2015-06-03 DIAGNOSIS — R7309 Other abnormal glucose: Secondary | ICD-10-CM

## 2015-06-03 DIAGNOSIS — I10 Essential (primary) hypertension: Secondary | ICD-10-CM

## 2015-06-03 DIAGNOSIS — F0280 Dementia in other diseases classified elsewhere without behavioral disturbance: Secondary | ICD-10-CM

## 2015-06-03 DIAGNOSIS — E782 Mixed hyperlipidemia: Secondary | ICD-10-CM | POA: Diagnosis not present

## 2015-06-03 DIAGNOSIS — M5431 Sciatica, right side: Secondary | ICD-10-CM | POA: Insufficient documentation

## 2015-06-03 DIAGNOSIS — R7303 Prediabetes: Secondary | ICD-10-CM

## 2015-06-03 DIAGNOSIS — G301 Alzheimer's disease with late onset: Principal | ICD-10-CM

## 2015-06-03 DIAGNOSIS — C8590 Non-Hodgkin lymphoma, unspecified, unspecified site: Secondary | ICD-10-CM

## 2015-06-03 DIAGNOSIS — E559 Vitamin D deficiency, unspecified: Secondary | ICD-10-CM

## 2015-06-03 MED ORDER — DEXAMETHASONE SODIUM PHOSPHATE 100 MG/10ML IJ SOLN
10.0000 mg | Freq: Once | INTRAMUSCULAR | Status: AC
  Administered 2015-06-03: 10 mg via INTRAMUSCULAR

## 2015-06-03 NOTE — Progress Notes (Signed)
Patient ID: Janet Reeves, female   DOB: 1929-10-14, 81 y.o.   MRN: [id number]   This very nice 81 y.o.female presents for  Evaluation of worsening Short term memory and with hx/o Hypertension, Hyperlipidemia, Pre-Diabetes and Vitamin D Deficiency. Patient recently moved and daughter relates patient has had increasing difficulty with ADL's and worsening ST recall and has also had recent fall defined by bruising on the head &  face w/o patient having recall of the incident(s). Daughter also reports that patient has been unreliable and inconsistent with her medications. A CTscan of the head showed mild atrophy, but no signs of  SDH, tumor or CVA. Patient now is reluctantly conceding to moving to assisted living and has rescinded to daughter her general and health care POA. Recent labs at a routine f/u OV showed no compromising metabolic problems.   Patient is monitored expectantly for HTN & BP has been controlled. Today's BP: 118/82 mmHg. Patient has had no complaints of any cardiac type chest pain, palpitations, dyspnea/orthopnea/PND, dizziness, claudication, or dependent edema.   Hyperlipidemia is not controlled with diet & meds. Patient denies myalgias or other med SE's. Last Lipids were not at goal -  Cholesterol 215*; HDL 46; LDL 135*; Triglycerides 170 on 05/07/2015.   Also, the patient has history of PreDiabetes and has had no symptoms of reactive hypoglycemia, diabetic polys, paresthesias or visual blurring.  Last A1c was  6.0% on 05/07/2015.    Further, the patient also has history of Vitamin D Deficiency and supplements vitamin D without any suspected side-effects. Last vitamin D was 41 on 05/07/2015.  Medication Sig  . aspirin 81 MG tablet Take 81 mg by mouth daily.  . Cholecalciferol (VITAMIN D3) 5000 UNITS CAPS Take 5,000 Units by mouth daily.  . citalopram (CELEXA) 10 MG tablet Take 1 tablet (10 mg total) by mouth daily. (Patient not taking: Reported on 06/03/2015)  . diazepam (VALIUM) 5 MG  tablet Take 0.5 tablets (2.5 mg total) by mouth every 6 (six) hours as needed for anxiety (spasms). (Patient not taking: Reported on 06/03/2015)  . ezetimibe (ZETIA) 10 MG tablet Take 1 tablet (10 mg total) by mouth daily. (Patient not taking: Reported on 06/03/2015)  . levothyroxine 150 MCG tablet Take 1 tablet (150 mcg total) by mouth daily.  Marland Kitchen FISH OIL) 1000 MG CAPS Take 1,000 mg by mouth daily.   . ranitidine  300 MG tablet take 1 tablet by mouth twice a day (Patient not taking: Reported on 06/03/2015)   Allergies  Allergen Reactions  . Codeine Nausea And Vomiting  . Macrobid [Nitrofurantoin Macrocrystal] Other (See Comments)    Dizziness.   PMHx:   Past Medical History  Diagnosis Date  . Diabetes mellitus     Diet controlled  . Hypertension   . Hypothyroid   . GERD (gastroesophageal reflux disease)   . Lymphoma     Chemotherapy  . Breast cancer     Mastectomy and chemotherapy  . Hyperlipidemia    Immunization History  Administered Date(s) Administered  . DTaP 06/07/2007  . H1N1 10/15/2008  . Influenza Whole 08/30/2013  . Influenza, High Dose Seasonal PF 10/08/2014  . PPD Test 06/03/2015  . Pneumococcal Polysaccharide-23 01/26/2011   Past Surgical History  Procedure Laterality Date  . Mastectomy    . Tonsillectomy    . Cholecystectomy    . Appendectomy     FHx:    Reviewed / unchanged  SHx:    Reviewed / unchanged  Systems Review:  Constitutional:  Denies fever, chills, wt changes, headaches, insomnia, fatigue, night sweats, change in appetite. Eyes: Denies redness, blurred vision, diplopia, discharge, itchy, watery eyes.  ENT: Denies discharge, congestion, post nasal drip, epistaxis, sore throat, earache, hearing loss, dental pain, tinnitus, vertigo, sinus pain, snoring.  CV: Denies chest pain, palpitations, irregular heartbeat, syncope, dyspnea, diaphoresis, orthopnea, PND, claudication or edema. Respiratory: denies cough, dyspnea, DOE, pleurisy, hoarseness,  laryngitis, wheezing.  Gastrointestinal: Denies dysphagia, odynophagia, heartburn, reflux, water brash, abdominal pain or cramps, nausea, vomiting, bloating, diarrhea, constipation, hematemesis, melena, hematochezia  or hemorrhoids. Genitourinary: Denies dysuria, frequency, urgency, nocturia, hesitancy, discharge, hematuria or flank pain. Musculoskeletal: c/o pain about the right hip. Skin: Denies pruritus, rash, hives, warts, acne, eczema or change in skin lesion(s). Neuro: No weakness, tremor, incoordination, spasms, paresthesia or pain. Psychiatric: Denies confusion, memory loss or sensory loss. Endo: Denies change in weight, skin or hair change.  Heme/Lymph: No excessive bleeding, bruising or enlarged lymph nodes.  Physical Exam  BP 118/82 mmHg  Pulse 72  Temp(Src) 97.3 F (36.3 C)  Resp 16  Ht 5\' 3"  (1.6 m)  Wt 181 lb 6.4 oz (82.283 kg)  BMI 32.14 kg/m2  Appears well nourished and in no distress. Eyes: PERRLA, EOMs, conjunctiva no swelling or erythema. Sinuses: No frontal/maxillary tenderness ENT/Mouth: EAC's clear, TM's nl w/o erythema, bulging. Nares clear w/o erythema, swelling, exudates. Oropharynx clear without erythema or exudates. Oral hygiene is good. Tongue normal, non obstructing. Hearing intact.  Neck: Supple. Thyroid nl. Car 2+/2+ without bruits, nodes or JVD. Chest: Respirations nl with BS clear & equal w/o rales, rhonchi, wheezing or stridor.  Cor: Heart sounds normal w/ regular rate and rhythm without sig. murmurs, gallops, clicks, or rubs. Peripheral pulses normal and equal  without edema.  Abdomen: Soft & bowel sounds normal. Non-tender w/o guarding, rebound, hernias, masses, or organomegaly.  Lymphatics: Unremarkable.  Musculoskeletal: Full ROM all peripheral extremities, joint stability, 5/5 strength, and normal gait. (+) tender over right hip greater trochanteric bursae Skin: Warm, dry without exposed rashes, lesions or ecchymosis apparent.  Neuro: Cranial  nerves intact, reflexes equal bilaterally. Sensory-motor testing grossly intact. Tendon reflexes flat. (+) snout and palmo-mental response.  Pysch: Alert & oriented x 3.  Insight and judgement limited. Short term recall is limited.    Assessment and Plan:  1. SDAT (senile dementia of Alzheimer's type)   2. Essential hypertension   3. Hyperlipidemia   4. Prediabetes   5. Vitamin D deficiency   6. Hypothyroidism   7. Lymphoma   8. Breast cancer,   9. Gastroesophageal reflux disease   10. Bursitis, hip, right  - dexamethasone (DECADRON) injection 10 mg; Inject 1 mL (10 mg total) into the muscle once.  11. Screening examination for pulmonary tuberculosis  - PPD   Recommended regular exercise, BP monitoring, weight control, and discussed med and SE's. Recommended labs to assess and monitor clinical status. Further disposition pending results of labs. Over 30 minutes of exam, counseling, chart review was performed

## 2015-06-03 NOTE — Patient Instructions (Signed)

## 2015-06-10 ENCOUNTER — Ambulatory Visit (INDEPENDENT_AMBULATORY_CARE_PROVIDER_SITE_OTHER): Payer: Medicare Other | Admitting: *Deleted

## 2015-06-10 DIAGNOSIS — R300 Dysuria: Secondary | ICD-10-CM

## 2015-06-10 DIAGNOSIS — R7989 Other specified abnormal findings of blood chemistry: Secondary | ICD-10-CM

## 2015-06-10 DIAGNOSIS — Z23 Encounter for immunization: Secondary | ICD-10-CM

## 2015-06-10 DIAGNOSIS — R799 Abnormal finding of blood chemistry, unspecified: Secondary | ICD-10-CM | POA: Diagnosis not present

## 2015-06-10 DIAGNOSIS — E039 Hypothyroidism, unspecified: Secondary | ICD-10-CM | POA: Diagnosis not present

## 2015-06-10 LAB — TSH: TSH: 1.725 u[IU]/mL (ref 0.350–4.500)

## 2015-06-11 ENCOUNTER — Other Ambulatory Visit: Payer: Self-pay | Admitting: *Deleted

## 2015-06-11 ENCOUNTER — Other Ambulatory Visit: Payer: Self-pay | Admitting: Internal Medicine

## 2015-06-11 LAB — URINALYSIS, MICROSCOPIC ONLY
Bacteria, UA: NONE SEEN [HPF]
CRYSTALS: NONE SEEN [HPF]
Casts: NONE SEEN [LPF]
WBC, UA: >60 WBC/HPF — AB (ref <=5)
Yeast: NONE SEEN [HPF]

## 2015-06-11 LAB — URINALYSIS, ROUTINE W REFLEX MICROSCOPIC
Bilirubin Urine: NEGATIVE
GLUCOSE, UA: NEGATIVE
Hgb urine dipstick: NEGATIVE
Nitrite: NEGATIVE
Specific Gravity, Urine: 1.012 (ref 1.001–1.035)
pH: 6 (ref 5.0–8.0)

## 2015-06-11 MED ORDER — BACLOFEN 10 MG PO TABS: ORAL_TABLET | ORAL | Status: DC

## 2015-06-12 LAB — URINE CULTURE

## 2015-06-30 ENCOUNTER — Other Ambulatory Visit: Payer: Self-pay | Admitting: Internal Medicine

## 2015-06-30 MED ORDER — CITALOPRAM HYDROBROMIDE 20 MG PO TABS: ORAL_TABLET | ORAL | Status: AC

## 2015-07-15 ENCOUNTER — Encounter: Payer: Self-pay | Admitting: Internal Medicine

## 2015-09-01 DIAGNOSIS — Z23 Encounter for immunization: Secondary | ICD-10-CM | POA: Diagnosis not present

## 2015-09-02 ENCOUNTER — Encounter: Payer: Self-pay | Admitting: Internal Medicine

## 2015-12-11 ENCOUNTER — Other Ambulatory Visit: Payer: Self-pay | Admitting: Internal Medicine

## 2015-12-13 DIAGNOSIS — I10 Essential (primary) hypertension: Secondary | ICD-10-CM | POA: Diagnosis not present

## 2015-12-13 DIAGNOSIS — M6281 Muscle weakness (generalized): Secondary | ICD-10-CM | POA: Diagnosis not present

## 2015-12-13 DIAGNOSIS — G301 Alzheimer's disease with late onset: Secondary | ICD-10-CM | POA: Diagnosis not present

## 2015-12-13 DIAGNOSIS — K219 Gastro-esophageal reflux disease without esophagitis: Secondary | ICD-10-CM | POA: Diagnosis not present

## 2015-12-16 ENCOUNTER — Ambulatory Visit: Payer: Medicare Other | Admitting: Internal Medicine

## 2015-12-17 DIAGNOSIS — K219 Gastro-esophageal reflux disease without esophagitis: Secondary | ICD-10-CM | POA: Diagnosis not present

## 2015-12-17 DIAGNOSIS — G301 Alzheimer's disease with late onset: Secondary | ICD-10-CM | POA: Diagnosis not present

## 2015-12-17 DIAGNOSIS — I10 Essential (primary) hypertension: Secondary | ICD-10-CM | POA: Diagnosis not present

## 2015-12-17 DIAGNOSIS — M6281 Muscle weakness (generalized): Secondary | ICD-10-CM | POA: Diagnosis not present

## 2015-12-18 DIAGNOSIS — K219 Gastro-esophageal reflux disease without esophagitis: Secondary | ICD-10-CM | POA: Diagnosis not present

## 2015-12-18 DIAGNOSIS — G301 Alzheimer's disease with late onset: Secondary | ICD-10-CM | POA: Diagnosis not present

## 2015-12-18 DIAGNOSIS — I10 Essential (primary) hypertension: Secondary | ICD-10-CM | POA: Diagnosis not present

## 2015-12-18 DIAGNOSIS — M6281 Muscle weakness (generalized): Secondary | ICD-10-CM | POA: Diagnosis not present

## 2015-12-22 DIAGNOSIS — G301 Alzheimer's disease with late onset: Secondary | ICD-10-CM | POA: Diagnosis not present

## 2015-12-22 DIAGNOSIS — I10 Essential (primary) hypertension: Secondary | ICD-10-CM | POA: Diagnosis not present

## 2015-12-22 DIAGNOSIS — K219 Gastro-esophageal reflux disease without esophagitis: Secondary | ICD-10-CM | POA: Diagnosis not present

## 2015-12-22 DIAGNOSIS — M6281 Muscle weakness (generalized): Secondary | ICD-10-CM | POA: Diagnosis not present

## 2015-12-24 DIAGNOSIS — E559 Vitamin D deficiency, unspecified: Secondary | ICD-10-CM | POA: Diagnosis not present

## 2015-12-24 DIAGNOSIS — E039 Hypothyroidism, unspecified: Secondary | ICD-10-CM | POA: Diagnosis not present

## 2015-12-24 DIAGNOSIS — K210 Gastro-esophageal reflux disease with esophagitis: Secondary | ICD-10-CM | POA: Diagnosis not present

## 2015-12-26 DIAGNOSIS — K219 Gastro-esophageal reflux disease without esophagitis: Secondary | ICD-10-CM | POA: Diagnosis not present

## 2015-12-26 DIAGNOSIS — M6281 Muscle weakness (generalized): Secondary | ICD-10-CM | POA: Diagnosis not present

## 2015-12-26 DIAGNOSIS — G301 Alzheimer's disease with late onset: Secondary | ICD-10-CM | POA: Diagnosis not present

## 2015-12-26 DIAGNOSIS — I10 Essential (primary) hypertension: Secondary | ICD-10-CM | POA: Diagnosis not present

## 2015-12-29 DIAGNOSIS — G301 Alzheimer's disease with late onset: Secondary | ICD-10-CM | POA: Diagnosis not present

## 2015-12-29 DIAGNOSIS — K219 Gastro-esophageal reflux disease without esophagitis: Secondary | ICD-10-CM | POA: Diagnosis not present

## 2015-12-29 DIAGNOSIS — M6281 Muscle weakness (generalized): Secondary | ICD-10-CM | POA: Diagnosis not present

## 2015-12-29 DIAGNOSIS — I10 Essential (primary) hypertension: Secondary | ICD-10-CM | POA: Diagnosis not present

## 2015-12-31 DIAGNOSIS — Z79899 Other long term (current) drug therapy: Secondary | ICD-10-CM | POA: Diagnosis not present

## 2016-01-02 DIAGNOSIS — I10 Essential (primary) hypertension: Secondary | ICD-10-CM | POA: Diagnosis not present

## 2016-01-02 DIAGNOSIS — M6281 Muscle weakness (generalized): Secondary | ICD-10-CM | POA: Diagnosis not present

## 2016-01-02 DIAGNOSIS — K219 Gastro-esophageal reflux disease without esophagitis: Secondary | ICD-10-CM | POA: Diagnosis not present

## 2016-01-02 DIAGNOSIS — G301 Alzheimer's disease with late onset: Secondary | ICD-10-CM | POA: Diagnosis not present

## 2016-01-09 DIAGNOSIS — I10 Essential (primary) hypertension: Secondary | ICD-10-CM | POA: Diagnosis not present

## 2016-01-09 DIAGNOSIS — M6281 Muscle weakness (generalized): Secondary | ICD-10-CM | POA: Diagnosis not present

## 2016-01-09 DIAGNOSIS — G301 Alzheimer's disease with late onset: Secondary | ICD-10-CM | POA: Diagnosis not present

## 2016-01-09 DIAGNOSIS — K219 Gastro-esophageal reflux disease without esophagitis: Secondary | ICD-10-CM | POA: Diagnosis not present

## 2016-01-14 DIAGNOSIS — K219 Gastro-esophageal reflux disease without esophagitis: Secondary | ICD-10-CM | POA: Diagnosis not present

## 2016-01-14 DIAGNOSIS — M6281 Muscle weakness (generalized): Secondary | ICD-10-CM | POA: Diagnosis not present

## 2016-01-14 DIAGNOSIS — I10 Essential (primary) hypertension: Secondary | ICD-10-CM | POA: Diagnosis not present

## 2016-01-14 DIAGNOSIS — G301 Alzheimer's disease with late onset: Secondary | ICD-10-CM | POA: Diagnosis not present

## 2016-01-21 DIAGNOSIS — R52 Pain, unspecified: Secondary | ICD-10-CM | POA: Diagnosis not present

## 2016-01-21 DIAGNOSIS — E785 Hyperlipidemia, unspecified: Secondary | ICD-10-CM | POA: Diagnosis not present

## 2016-01-21 DIAGNOSIS — E039 Hypothyroidism, unspecified: Secondary | ICD-10-CM | POA: Diagnosis not present

## 2016-01-21 DIAGNOSIS — R739 Hyperglycemia, unspecified: Secondary | ICD-10-CM | POA: Diagnosis not present

## 2016-01-23 DIAGNOSIS — M6281 Muscle weakness (generalized): Secondary | ICD-10-CM | POA: Diagnosis not present

## 2016-01-23 DIAGNOSIS — K219 Gastro-esophageal reflux disease without esophagitis: Secondary | ICD-10-CM | POA: Diagnosis not present

## 2016-01-23 DIAGNOSIS — I10 Essential (primary) hypertension: Secondary | ICD-10-CM | POA: Diagnosis not present

## 2016-01-23 DIAGNOSIS — G301 Alzheimer's disease with late onset: Secondary | ICD-10-CM | POA: Diagnosis not present

## 2016-01-28 DIAGNOSIS — G309 Alzheimer's disease, unspecified: Secondary | ICD-10-CM | POA: Diagnosis not present

## 2016-01-28 DIAGNOSIS — R52 Pain, unspecified: Secondary | ICD-10-CM | POA: Diagnosis not present

## 2016-01-28 DIAGNOSIS — I10 Essential (primary) hypertension: Secondary | ICD-10-CM | POA: Diagnosis not present

## 2016-01-29 DIAGNOSIS — G301 Alzheimer's disease with late onset: Secondary | ICD-10-CM | POA: Diagnosis not present

## 2016-01-29 DIAGNOSIS — K219 Gastro-esophageal reflux disease without esophagitis: Secondary | ICD-10-CM | POA: Diagnosis not present

## 2016-01-29 DIAGNOSIS — I10 Essential (primary) hypertension: Secondary | ICD-10-CM | POA: Diagnosis not present

## 2016-01-29 DIAGNOSIS — M6281 Muscle weakness (generalized): Secondary | ICD-10-CM | POA: Diagnosis not present

## 2016-01-30 DIAGNOSIS — I10 Essential (primary) hypertension: Secondary | ICD-10-CM | POA: Diagnosis not present

## 2016-01-30 DIAGNOSIS — K219 Gastro-esophageal reflux disease without esophagitis: Secondary | ICD-10-CM | POA: Diagnosis not present

## 2016-01-30 DIAGNOSIS — M6281 Muscle weakness (generalized): Secondary | ICD-10-CM | POA: Diagnosis not present

## 2016-01-30 DIAGNOSIS — G301 Alzheimer's disease with late onset: Secondary | ICD-10-CM | POA: Diagnosis not present

## 2016-02-04 DIAGNOSIS — G301 Alzheimer's disease with late onset: Secondary | ICD-10-CM | POA: Diagnosis not present

## 2016-02-04 DIAGNOSIS — M6281 Muscle weakness (generalized): Secondary | ICD-10-CM | POA: Diagnosis not present

## 2016-02-04 DIAGNOSIS — K219 Gastro-esophageal reflux disease without esophagitis: Secondary | ICD-10-CM | POA: Diagnosis not present

## 2016-02-04 DIAGNOSIS — I10 Essential (primary) hypertension: Secondary | ICD-10-CM | POA: Diagnosis not present

## 2016-02-05 DIAGNOSIS — G301 Alzheimer's disease with late onset: Secondary | ICD-10-CM | POA: Diagnosis not present

## 2016-02-05 DIAGNOSIS — M6281 Muscle weakness (generalized): Secondary | ICD-10-CM | POA: Diagnosis not present

## 2016-02-05 DIAGNOSIS — I10 Essential (primary) hypertension: Secondary | ICD-10-CM | POA: Diagnosis not present

## 2016-02-05 DIAGNOSIS — K219 Gastro-esophageal reflux disease without esophagitis: Secondary | ICD-10-CM | POA: Diagnosis not present

## 2016-02-06 DIAGNOSIS — M6281 Muscle weakness (generalized): Secondary | ICD-10-CM | POA: Diagnosis not present

## 2016-02-06 DIAGNOSIS — G301 Alzheimer's disease with late onset: Secondary | ICD-10-CM | POA: Diagnosis not present

## 2016-02-06 DIAGNOSIS — I10 Essential (primary) hypertension: Secondary | ICD-10-CM | POA: Diagnosis not present

## 2016-02-06 DIAGNOSIS — K219 Gastro-esophageal reflux disease without esophagitis: Secondary | ICD-10-CM | POA: Diagnosis not present

## 2016-02-09 DIAGNOSIS — K219 Gastro-esophageal reflux disease without esophagitis: Secondary | ICD-10-CM | POA: Diagnosis not present

## 2016-02-09 DIAGNOSIS — M6281 Muscle weakness (generalized): Secondary | ICD-10-CM | POA: Diagnosis not present

## 2016-02-09 DIAGNOSIS — I10 Essential (primary) hypertension: Secondary | ICD-10-CM | POA: Diagnosis not present

## 2016-02-09 DIAGNOSIS — G301 Alzheimer's disease with late onset: Secondary | ICD-10-CM | POA: Diagnosis not present

## 2016-02-10 DIAGNOSIS — K219 Gastro-esophageal reflux disease without esophagitis: Secondary | ICD-10-CM | POA: Diagnosis not present

## 2016-02-10 DIAGNOSIS — G301 Alzheimer's disease with late onset: Secondary | ICD-10-CM | POA: Diagnosis not present

## 2016-02-10 DIAGNOSIS — I10 Essential (primary) hypertension: Secondary | ICD-10-CM | POA: Diagnosis not present

## 2016-02-10 DIAGNOSIS — M6281 Muscle weakness (generalized): Secondary | ICD-10-CM | POA: Diagnosis not present

## 2016-02-11 DIAGNOSIS — Z79899 Other long term (current) drug therapy: Secondary | ICD-10-CM | POA: Diagnosis not present

## 2016-02-11 DIAGNOSIS — E559 Vitamin D deficiency, unspecified: Secondary | ICD-10-CM | POA: Diagnosis not present

## 2016-02-11 DIAGNOSIS — E785 Hyperlipidemia, unspecified: Secondary | ICD-10-CM | POA: Diagnosis not present

## 2016-02-11 DIAGNOSIS — M6281 Muscle weakness (generalized): Secondary | ICD-10-CM | POA: Diagnosis not present

## 2016-02-13 DIAGNOSIS — I10 Essential (primary) hypertension: Secondary | ICD-10-CM | POA: Diagnosis not present

## 2016-02-13 DIAGNOSIS — K219 Gastro-esophageal reflux disease without esophagitis: Secondary | ICD-10-CM | POA: Diagnosis not present

## 2016-02-13 DIAGNOSIS — M6281 Muscle weakness (generalized): Secondary | ICD-10-CM | POA: Diagnosis not present

## 2016-02-13 DIAGNOSIS — G301 Alzheimer's disease with late onset: Secondary | ICD-10-CM | POA: Diagnosis not present

## 2016-02-16 DIAGNOSIS — I10 Essential (primary) hypertension: Secondary | ICD-10-CM | POA: Diagnosis not present

## 2016-02-16 DIAGNOSIS — G301 Alzheimer's disease with late onset: Secondary | ICD-10-CM | POA: Diagnosis not present

## 2016-02-16 DIAGNOSIS — K219 Gastro-esophageal reflux disease without esophagitis: Secondary | ICD-10-CM | POA: Diagnosis not present

## 2016-02-16 DIAGNOSIS — M6281 Muscle weakness (generalized): Secondary | ICD-10-CM | POA: Diagnosis not present

## 2016-02-17 DIAGNOSIS — K219 Gastro-esophageal reflux disease without esophagitis: Secondary | ICD-10-CM | POA: Diagnosis not present

## 2016-02-17 DIAGNOSIS — G301 Alzheimer's disease with late onset: Secondary | ICD-10-CM | POA: Diagnosis not present

## 2016-02-17 DIAGNOSIS — I10 Essential (primary) hypertension: Secondary | ICD-10-CM | POA: Diagnosis not present

## 2016-02-17 DIAGNOSIS — M6281 Muscle weakness (generalized): Secondary | ICD-10-CM | POA: Diagnosis not present

## 2016-02-18 DIAGNOSIS — M6281 Muscle weakness (generalized): Secondary | ICD-10-CM | POA: Diagnosis not present

## 2016-02-18 DIAGNOSIS — G301 Alzheimer's disease with late onset: Secondary | ICD-10-CM | POA: Diagnosis not present

## 2016-02-18 DIAGNOSIS — I10 Essential (primary) hypertension: Secondary | ICD-10-CM | POA: Diagnosis not present

## 2016-02-18 DIAGNOSIS — K219 Gastro-esophageal reflux disease without esophagitis: Secondary | ICD-10-CM | POA: Diagnosis not present

## 2016-02-20 DIAGNOSIS — M6281 Muscle weakness (generalized): Secondary | ICD-10-CM | POA: Diagnosis not present

## 2016-02-20 DIAGNOSIS — K219 Gastro-esophageal reflux disease without esophagitis: Secondary | ICD-10-CM | POA: Diagnosis not present

## 2016-02-20 DIAGNOSIS — I10 Essential (primary) hypertension: Secondary | ICD-10-CM | POA: Diagnosis not present

## 2016-02-20 DIAGNOSIS — G301 Alzheimer's disease with late onset: Secondary | ICD-10-CM | POA: Diagnosis not present

## 2016-02-24 DIAGNOSIS — M6281 Muscle weakness (generalized): Secondary | ICD-10-CM | POA: Diagnosis not present

## 2016-02-24 DIAGNOSIS — K219 Gastro-esophageal reflux disease without esophagitis: Secondary | ICD-10-CM | POA: Diagnosis not present

## 2016-02-24 DIAGNOSIS — G301 Alzheimer's disease with late onset: Secondary | ICD-10-CM | POA: Diagnosis not present

## 2016-02-24 DIAGNOSIS — I10 Essential (primary) hypertension: Secondary | ICD-10-CM | POA: Diagnosis not present

## 2016-02-25 DIAGNOSIS — I10 Essential (primary) hypertension: Secondary | ICD-10-CM | POA: Diagnosis not present

## 2016-02-25 DIAGNOSIS — K219 Gastro-esophageal reflux disease without esophagitis: Secondary | ICD-10-CM | POA: Diagnosis not present

## 2016-02-25 DIAGNOSIS — Z79899 Other long term (current) drug therapy: Secondary | ICD-10-CM | POA: Diagnosis not present

## 2016-02-25 DIAGNOSIS — M6281 Muscle weakness (generalized): Secondary | ICD-10-CM | POA: Diagnosis not present

## 2016-02-25 DIAGNOSIS — G301 Alzheimer's disease with late onset: Secondary | ICD-10-CM | POA: Diagnosis not present

## 2016-02-27 DIAGNOSIS — G301 Alzheimer's disease with late onset: Secondary | ICD-10-CM | POA: Diagnosis not present

## 2016-02-27 DIAGNOSIS — M6281 Muscle weakness (generalized): Secondary | ICD-10-CM | POA: Diagnosis not present

## 2016-02-27 DIAGNOSIS — I10 Essential (primary) hypertension: Secondary | ICD-10-CM | POA: Diagnosis not present

## 2016-02-27 DIAGNOSIS — K219 Gastro-esophageal reflux disease without esophagitis: Secondary | ICD-10-CM | POA: Diagnosis not present

## 2016-03-01 DIAGNOSIS — K219 Gastro-esophageal reflux disease without esophagitis: Secondary | ICD-10-CM | POA: Diagnosis not present

## 2016-03-01 DIAGNOSIS — E039 Hypothyroidism, unspecified: Secondary | ICD-10-CM | POA: Diagnosis not present

## 2016-03-01 DIAGNOSIS — I10 Essential (primary) hypertension: Secondary | ICD-10-CM | POA: Diagnosis not present

## 2016-03-01 DIAGNOSIS — G309 Alzheimer's disease, unspecified: Secondary | ICD-10-CM | POA: Diagnosis not present

## 2016-03-01 DIAGNOSIS — M6281 Muscle weakness (generalized): Secondary | ICD-10-CM | POA: Diagnosis not present

## 2016-03-01 DIAGNOSIS — D519 Vitamin B12 deficiency anemia, unspecified: Secondary | ICD-10-CM | POA: Diagnosis not present

## 2016-03-01 DIAGNOSIS — G301 Alzheimer's disease with late onset: Secondary | ICD-10-CM | POA: Diagnosis not present

## 2016-03-03 DIAGNOSIS — G301 Alzheimer's disease with late onset: Secondary | ICD-10-CM | POA: Diagnosis not present

## 2016-03-03 DIAGNOSIS — K219 Gastro-esophageal reflux disease without esophagitis: Secondary | ICD-10-CM | POA: Diagnosis not present

## 2016-03-03 DIAGNOSIS — I10 Essential (primary) hypertension: Secondary | ICD-10-CM | POA: Diagnosis not present

## 2016-03-03 DIAGNOSIS — M6281 Muscle weakness (generalized): Secondary | ICD-10-CM | POA: Diagnosis not present

## 2016-03-05 DIAGNOSIS — I10 Essential (primary) hypertension: Secondary | ICD-10-CM | POA: Diagnosis not present

## 2016-03-05 DIAGNOSIS — M6281 Muscle weakness (generalized): Secondary | ICD-10-CM | POA: Diagnosis not present

## 2016-03-05 DIAGNOSIS — K219 Gastro-esophageal reflux disease without esophagitis: Secondary | ICD-10-CM | POA: Diagnosis not present

## 2016-03-05 DIAGNOSIS — G301 Alzheimer's disease with late onset: Secondary | ICD-10-CM | POA: Diagnosis not present

## 2016-03-06 DIAGNOSIS — M6281 Muscle weakness (generalized): Secondary | ICD-10-CM | POA: Diagnosis not present

## 2016-03-06 DIAGNOSIS — G301 Alzheimer's disease with late onset: Secondary | ICD-10-CM | POA: Diagnosis not present

## 2016-03-06 DIAGNOSIS — I10 Essential (primary) hypertension: Secondary | ICD-10-CM | POA: Diagnosis not present

## 2016-03-06 DIAGNOSIS — K219 Gastro-esophageal reflux disease without esophagitis: Secondary | ICD-10-CM | POA: Diagnosis not present

## 2016-03-09 DIAGNOSIS — G301 Alzheimer's disease with late onset: Secondary | ICD-10-CM | POA: Diagnosis not present

## 2016-03-09 DIAGNOSIS — M6281 Muscle weakness (generalized): Secondary | ICD-10-CM | POA: Diagnosis not present

## 2016-03-09 DIAGNOSIS — K219 Gastro-esophageal reflux disease without esophagitis: Secondary | ICD-10-CM | POA: Diagnosis not present

## 2016-03-09 DIAGNOSIS — I10 Essential (primary) hypertension: Secondary | ICD-10-CM | POA: Diagnosis not present

## 2016-03-12 DIAGNOSIS — M6281 Muscle weakness (generalized): Secondary | ICD-10-CM | POA: Diagnosis not present

## 2016-03-12 DIAGNOSIS — K219 Gastro-esophageal reflux disease without esophagitis: Secondary | ICD-10-CM | POA: Diagnosis not present

## 2016-03-12 DIAGNOSIS — I10 Essential (primary) hypertension: Secondary | ICD-10-CM | POA: Diagnosis not present

## 2016-03-12 DIAGNOSIS — G301 Alzheimer's disease with late onset: Secondary | ICD-10-CM | POA: Diagnosis not present

## 2016-03-15 DIAGNOSIS — E559 Vitamin D deficiency, unspecified: Secondary | ICD-10-CM | POA: Diagnosis not present

## 2016-03-16 DIAGNOSIS — K219 Gastro-esophageal reflux disease without esophagitis: Secondary | ICD-10-CM | POA: Diagnosis not present

## 2016-03-16 DIAGNOSIS — G301 Alzheimer's disease with late onset: Secondary | ICD-10-CM | POA: Diagnosis not present

## 2016-03-16 DIAGNOSIS — M6281 Muscle weakness (generalized): Secondary | ICD-10-CM | POA: Diagnosis not present

## 2016-03-16 DIAGNOSIS — I10 Essential (primary) hypertension: Secondary | ICD-10-CM | POA: Diagnosis not present

## 2016-03-22 DIAGNOSIS — M6281 Muscle weakness (generalized): Secondary | ICD-10-CM | POA: Diagnosis not present

## 2016-03-22 DIAGNOSIS — K219 Gastro-esophageal reflux disease without esophagitis: Secondary | ICD-10-CM | POA: Diagnosis not present

## 2016-03-22 DIAGNOSIS — I10 Essential (primary) hypertension: Secondary | ICD-10-CM | POA: Diagnosis not present

## 2016-03-22 DIAGNOSIS — G301 Alzheimer's disease with late onset: Secondary | ICD-10-CM | POA: Diagnosis not present

## 2016-03-24 DIAGNOSIS — K219 Gastro-esophageal reflux disease without esophagitis: Secondary | ICD-10-CM | POA: Diagnosis not present

## 2016-03-24 DIAGNOSIS — I10 Essential (primary) hypertension: Secondary | ICD-10-CM | POA: Diagnosis not present

## 2016-03-24 DIAGNOSIS — M6281 Muscle weakness (generalized): Secondary | ICD-10-CM | POA: Diagnosis not present

## 2016-03-24 DIAGNOSIS — G301 Alzheimer's disease with late onset: Secondary | ICD-10-CM | POA: Diagnosis not present

## 2016-03-25 DIAGNOSIS — M6281 Muscle weakness (generalized): Secondary | ICD-10-CM | POA: Diagnosis not present

## 2016-03-25 DIAGNOSIS — G301 Alzheimer's disease with late onset: Secondary | ICD-10-CM | POA: Diagnosis not present

## 2016-03-25 DIAGNOSIS — K219 Gastro-esophageal reflux disease without esophagitis: Secondary | ICD-10-CM | POA: Diagnosis not present

## 2016-03-25 DIAGNOSIS — I10 Essential (primary) hypertension: Secondary | ICD-10-CM | POA: Diagnosis not present

## 2016-03-26 DIAGNOSIS — R269 Unspecified abnormalities of gait and mobility: Secondary | ICD-10-CM | POA: Diagnosis not present

## 2016-03-29 DIAGNOSIS — K219 Gastro-esophageal reflux disease without esophagitis: Secondary | ICD-10-CM | POA: Diagnosis not present

## 2016-03-29 DIAGNOSIS — G301 Alzheimer's disease with late onset: Secondary | ICD-10-CM | POA: Diagnosis not present

## 2016-03-29 DIAGNOSIS — M6281 Muscle weakness (generalized): Secondary | ICD-10-CM | POA: Diagnosis not present

## 2016-03-29 DIAGNOSIS — I10 Essential (primary) hypertension: Secondary | ICD-10-CM | POA: Diagnosis not present

## 2016-04-15 DIAGNOSIS — M2040 Other hammer toe(s) (acquired), unspecified foot: Secondary | ICD-10-CM | POA: Diagnosis not present

## 2016-04-15 DIAGNOSIS — L84 Corns and callosities: Secondary | ICD-10-CM | POA: Diagnosis not present

## 2016-04-15 DIAGNOSIS — L603 Nail dystrophy: Secondary | ICD-10-CM | POA: Diagnosis not present

## 2016-04-15 DIAGNOSIS — B351 Tinea unguium: Secondary | ICD-10-CM | POA: Diagnosis not present

## 2016-04-26 DIAGNOSIS — I10 Essential (primary) hypertension: Secondary | ICD-10-CM | POA: Diagnosis not present

## 2016-04-26 DIAGNOSIS — G8929 Other chronic pain: Secondary | ICD-10-CM | POA: Diagnosis not present

## 2016-04-26 DIAGNOSIS — H1045 Other chronic allergic conjunctivitis: Secondary | ICD-10-CM | POA: Diagnosis not present

## 2016-04-26 DIAGNOSIS — E46 Unspecified protein-calorie malnutrition: Secondary | ICD-10-CM | POA: Diagnosis not present

## 2016-04-27 DIAGNOSIS — Z79899 Other long term (current) drug therapy: Secondary | ICD-10-CM | POA: Diagnosis not present

## 2016-04-27 DIAGNOSIS — D519 Vitamin B12 deficiency anemia, unspecified: Secondary | ICD-10-CM | POA: Diagnosis not present

## 2016-05-24 DIAGNOSIS — E039 Hypothyroidism, unspecified: Secondary | ICD-10-CM | POA: Diagnosis not present

## 2016-05-24 DIAGNOSIS — H1045 Other chronic allergic conjunctivitis: Secondary | ICD-10-CM | POA: Diagnosis not present

## 2016-06-21 DIAGNOSIS — E785 Hyperlipidemia, unspecified: Secondary | ICD-10-CM | POA: Diagnosis not present

## 2016-06-21 DIAGNOSIS — E559 Vitamin D deficiency, unspecified: Secondary | ICD-10-CM | POA: Diagnosis not present

## 2016-06-21 DIAGNOSIS — G309 Alzheimer's disease, unspecified: Secondary | ICD-10-CM | POA: Diagnosis not present

## 2016-07-19 DIAGNOSIS — M1990 Unspecified osteoarthritis, unspecified site: Secondary | ICD-10-CM | POA: Diagnosis not present

## 2016-07-19 DIAGNOSIS — I10 Essential (primary) hypertension: Secondary | ICD-10-CM | POA: Diagnosis not present

## 2016-07-19 DIAGNOSIS — E039 Hypothyroidism, unspecified: Secondary | ICD-10-CM | POA: Diagnosis not present

## 2016-08-24 DIAGNOSIS — L84 Corns and callosities: Secondary | ICD-10-CM | POA: Diagnosis not present

## 2016-08-24 DIAGNOSIS — B351 Tinea unguium: Secondary | ICD-10-CM | POA: Diagnosis not present

## 2016-08-24 DIAGNOSIS — M2040 Other hammer toe(s) (acquired), unspecified foot: Secondary | ICD-10-CM | POA: Diagnosis not present

## 2016-08-24 DIAGNOSIS — I7389 Other specified peripheral vascular diseases: Secondary | ICD-10-CM | POA: Diagnosis not present

## 2016-08-24 DIAGNOSIS — L603 Nail dystrophy: Secondary | ICD-10-CM | POA: Diagnosis not present

## 2016-09-01 DIAGNOSIS — E039 Hypothyroidism, unspecified: Secondary | ICD-10-CM | POA: Diagnosis not present

## 2016-09-01 DIAGNOSIS — Z79899 Other long term (current) drug therapy: Secondary | ICD-10-CM | POA: Diagnosis not present

## 2016-09-01 DIAGNOSIS — E785 Hyperlipidemia, unspecified: Secondary | ICD-10-CM | POA: Diagnosis not present

## 2016-09-13 DIAGNOSIS — Z23 Encounter for immunization: Secondary | ICD-10-CM | POA: Diagnosis not present

## 2016-09-21 DIAGNOSIS — E559 Vitamin D deficiency, unspecified: Secondary | ICD-10-CM | POA: Diagnosis not present

## 2016-09-21 DIAGNOSIS — E039 Hypothyroidism, unspecified: Secondary | ICD-10-CM | POA: Diagnosis not present

## 2016-09-21 DIAGNOSIS — I10 Essential (primary) hypertension: Secondary | ICD-10-CM | POA: Diagnosis not present

## 2016-10-04 ENCOUNTER — Encounter: Payer: Self-pay | Admitting: Internal Medicine

## 2016-10-04 DIAGNOSIS — C50911 Malignant neoplasm of unspecified site of right female breast: Secondary | ICD-10-CM | POA: Diagnosis not present

## 2016-10-04 IMAGING — CT CT HEAD W/O CM
1 series · 15 of 29 positions shown, 19 images · non-contrast
Comparison: Head CT dated 08/20/2014.

CLINICAL DATA: Subacute confusional state with right-sided head
tingling. Status post fall 2 weeks ago. Rule out subdural hematoma,
stroke. History of breast cancer with chemotherapy and mastectomy.

EXAM:
CT HEAD WITHOUT CONTRAST
TECHNIQUE: Contiguous axial images were obtained from the base of the skull
through the vertex without intravenous contrast.

[Series 2: head w/(date) · axial · 0.46mm/px · z∈[-96,+34]mm · 15 of 29 slices shown, 19 images]
[im 2/29  brain]
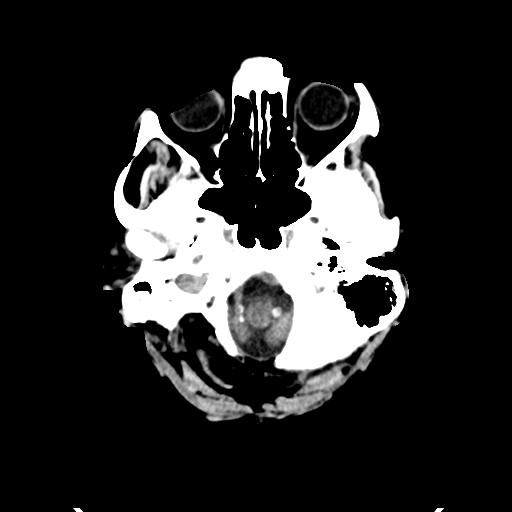
[im 2/29  bone]
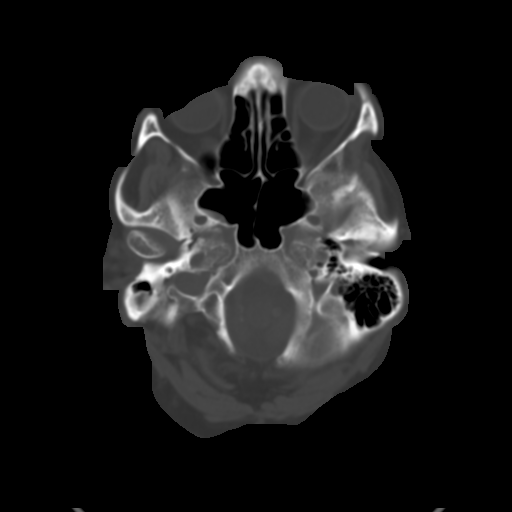
[im 4/29  brain]
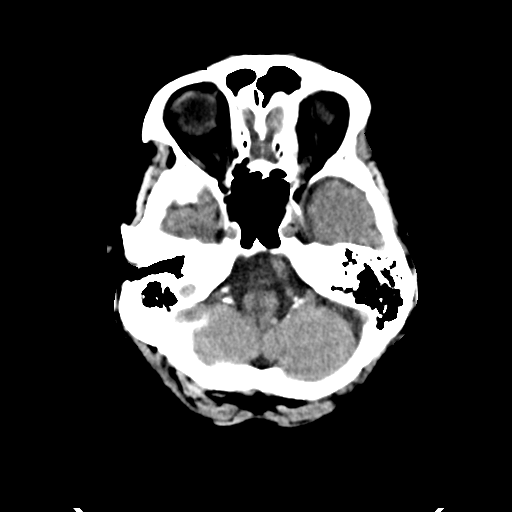
[im 6/29  brain]
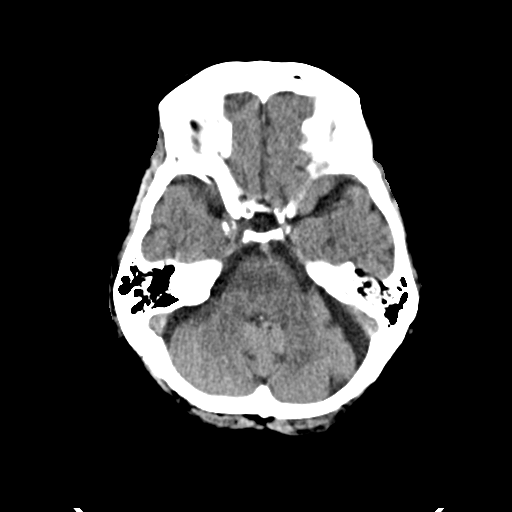
[im 8/29  brain]
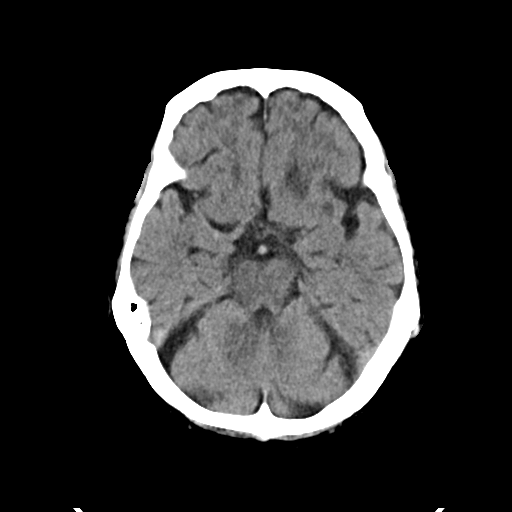
[im 10/29  brain]
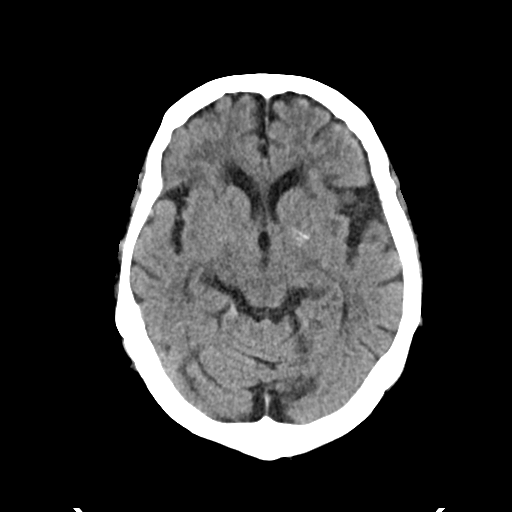
[im 10/29  bone]
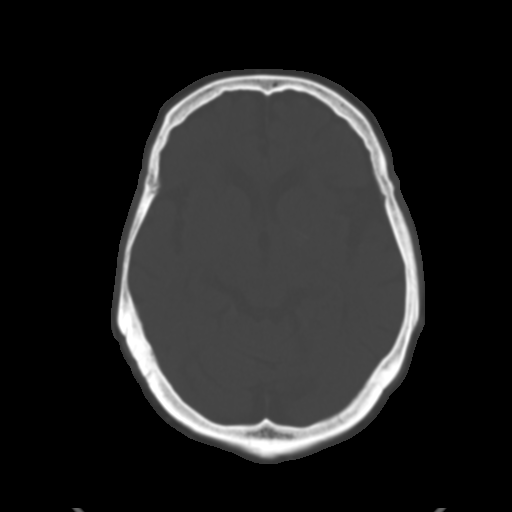
[im 11/29  brain]
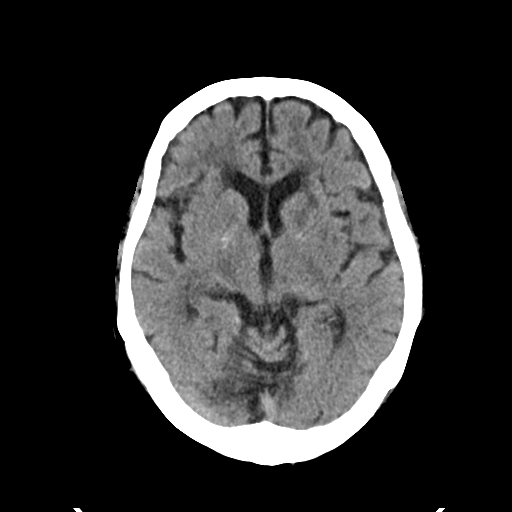
[im 13/29  brain]
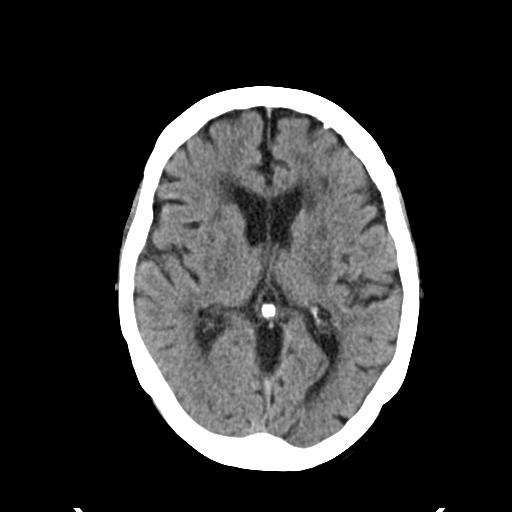
[im 15/29  brain]
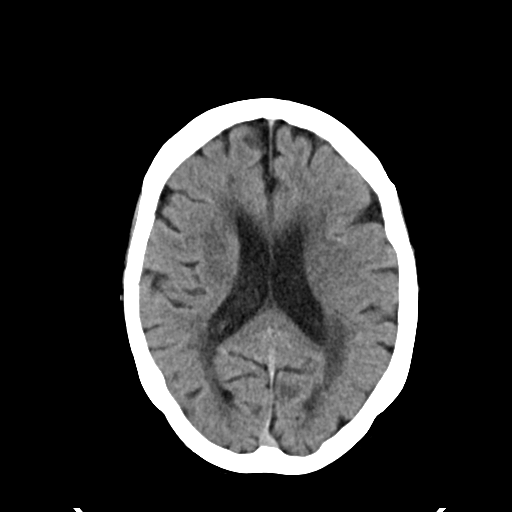
[im 17/29  brain]
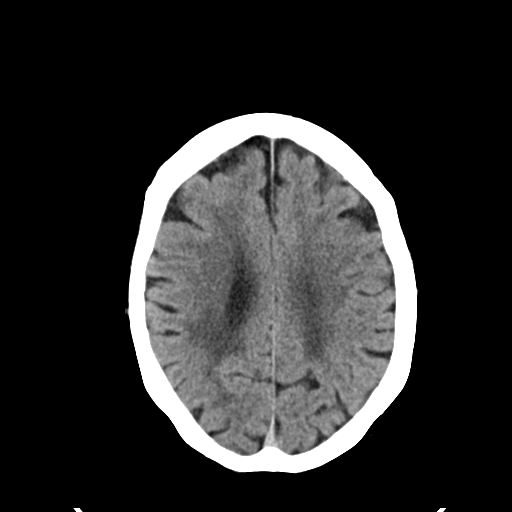
[im 17/29  bone]
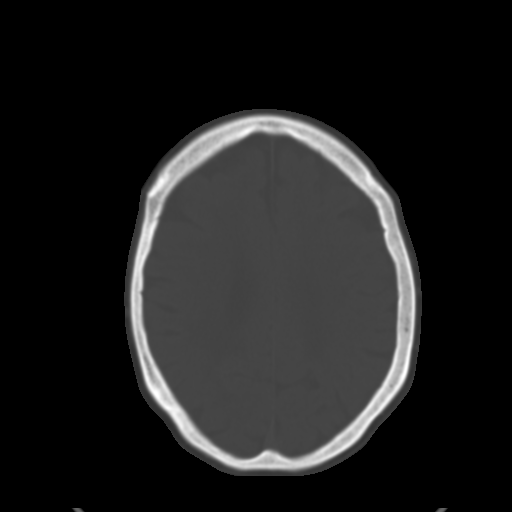
[im 19/29  brain]
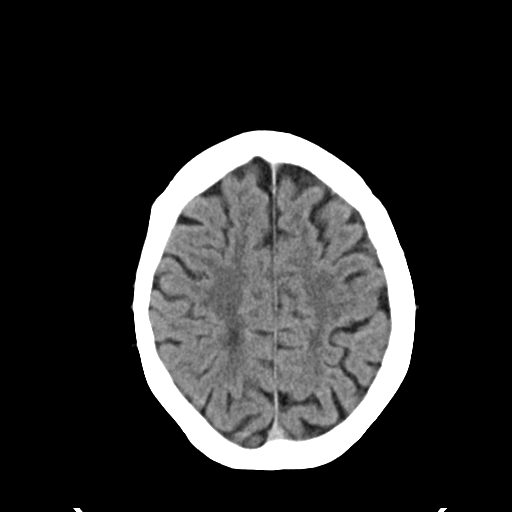
[im 20/29  brain]
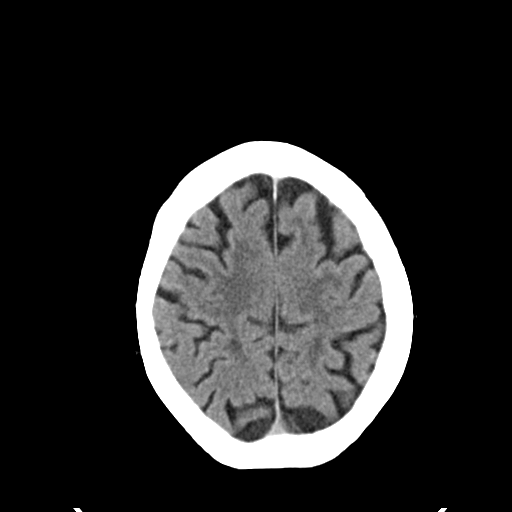
[im 22/29  brain]
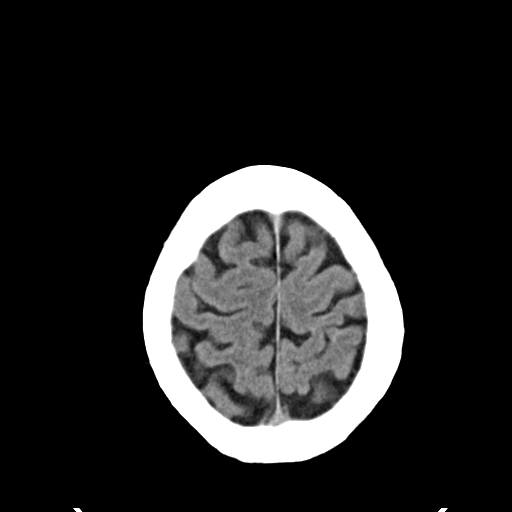
[im 24/29  brain]
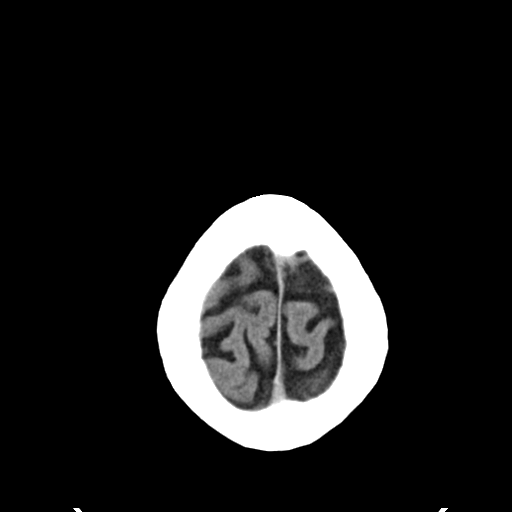
[im 24/29  bone]
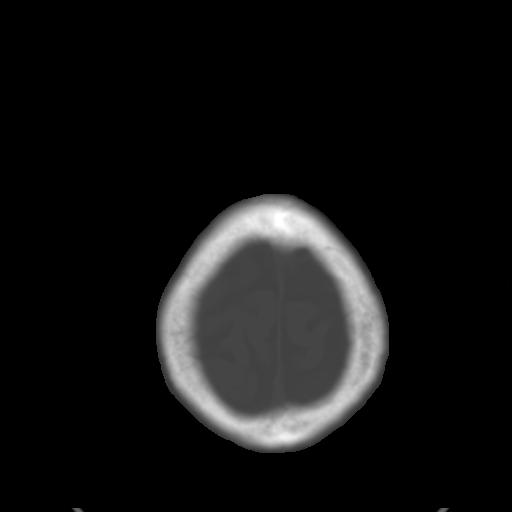
[im 26/29  brain]
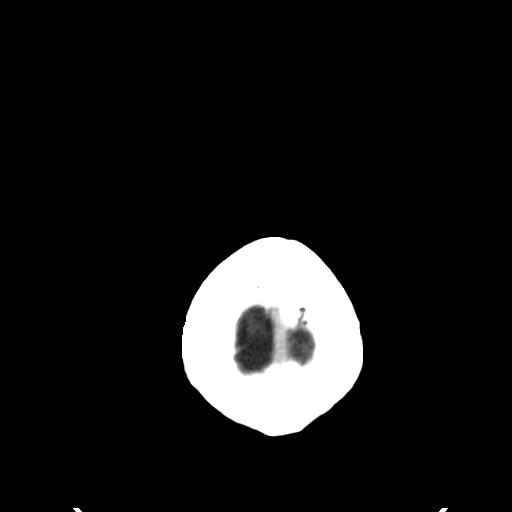
[im 28/29  brain]
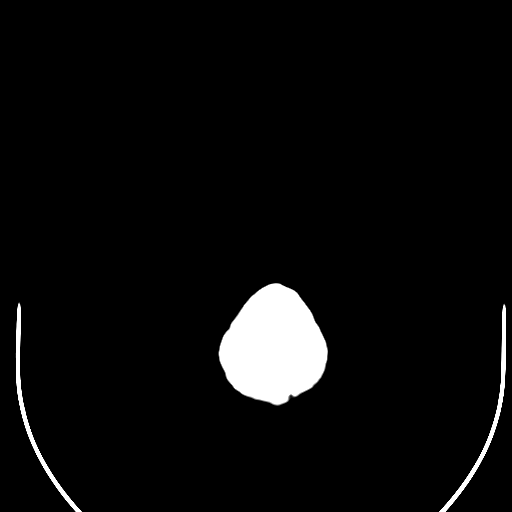

[15 of 29 positions shown; findings below may reference images not displayed]

FINDINGS: Again noted is generalized brain atrophy with commensurate
dilatation of the ventricles and sulci. Chronic small vessel
ischemic changes again noted within the bilateral periventricular
and subcortical white matter without significant interval change.
Also again noted are small old lacunar infarcts within each basal
ganglia region.

There is no mass, hemorrhage, edema, or other evidence of acute
parenchymal abnormality. No extra-axial hemorrhage. No osseous
abnormality. Also again noted are chronic calcified atherosclerotic
changes of the large vessels at the skull base.
IMPRESSION: 1. Atrophy and chronic ischemic changes as detailed above.
2. No acute findings. No intracranial mass, hemorrhage, or edema. No
fracture.

## 2016-10-06 DIAGNOSIS — E039 Hypothyroidism, unspecified: Secondary | ICD-10-CM | POA: Diagnosis not present

## 2016-10-06 DIAGNOSIS — M6281 Muscle weakness (generalized): Secondary | ICD-10-CM | POA: Diagnosis not present

## 2016-10-06 DIAGNOSIS — I10 Essential (primary) hypertension: Secondary | ICD-10-CM | POA: Diagnosis not present

## 2016-10-06 DIAGNOSIS — Z79899 Other long term (current) drug therapy: Secondary | ICD-10-CM | POA: Diagnosis not present

## 2016-10-14 DIAGNOSIS — C50911 Malignant neoplasm of unspecified site of right female breast: Secondary | ICD-10-CM | POA: Diagnosis not present

## 2016-10-14 DIAGNOSIS — F5101 Primary insomnia: Secondary | ICD-10-CM | POA: Diagnosis not present

## 2016-10-19 ENCOUNTER — Encounter (HOSPITAL_COMMUNITY): Payer: Self-pay | Admitting: Emergency Medicine

## 2016-10-19 ENCOUNTER — Emergency Department (HOSPITAL_COMMUNITY): Payer: Medicare Other

## 2016-10-19 ENCOUNTER — Observation Stay (HOSPITAL_COMMUNITY)
Admission: EM | Admit: 2016-10-19 | Discharge: 2016-10-20 | Disposition: A | Discharge status: Home / Self Care | Payer: Medicare Other | Attending: Internal Medicine | Admitting: Internal Medicine

## 2016-10-19 DIAGNOSIS — Z9221 Personal history of antineoplastic chemotherapy: Secondary | ICD-10-CM | POA: Insufficient documentation

## 2016-10-19 DIAGNOSIS — N390 Urinary tract infection, site not specified: Secondary | ICD-10-CM | POA: Insufficient documentation

## 2016-10-19 DIAGNOSIS — Z8572 Personal history of non-Hodgkin lymphomas: Secondary | ICD-10-CM | POA: Insufficient documentation

## 2016-10-19 DIAGNOSIS — M7989 Other specified soft tissue disorders: Secondary | ICD-10-CM | POA: Diagnosis not present

## 2016-10-19 DIAGNOSIS — I491 Atrial premature depolarization: Secondary | ICD-10-CM | POA: Diagnosis not present

## 2016-10-19 DIAGNOSIS — Z79899 Other long term (current) drug therapy: Secondary | ICD-10-CM | POA: Diagnosis not present

## 2016-10-19 DIAGNOSIS — I10 Essential (primary) hypertension: Secondary | ICD-10-CM | POA: Diagnosis not present

## 2016-10-19 DIAGNOSIS — I951 Orthostatic hypotension: Secondary | ICD-10-CM | POA: Diagnosis not present

## 2016-10-19 DIAGNOSIS — Z8249 Family history of ischemic heart disease and other diseases of the circulatory system: Secondary | ICD-10-CM | POA: Insufficient documentation

## 2016-10-19 DIAGNOSIS — E039 Hypothyroidism, unspecified: Secondary | ICD-10-CM | POA: Diagnosis not present

## 2016-10-19 DIAGNOSIS — E876 Hypokalemia: Secondary | ICD-10-CM | POA: Insufficient documentation

## 2016-10-19 DIAGNOSIS — Z853 Personal history of malignant neoplasm of breast: Secondary | ICD-10-CM | POA: Insufficient documentation

## 2016-10-19 DIAGNOSIS — Z823 Family history of stroke: Secondary | ICD-10-CM | POA: Insufficient documentation

## 2016-10-19 DIAGNOSIS — K219 Gastro-esophageal reflux disease without esophagitis: Secondary | ICD-10-CM | POA: Insufficient documentation

## 2016-10-19 DIAGNOSIS — R32 Unspecified urinary incontinence: Secondary | ICD-10-CM | POA: Insufficient documentation

## 2016-10-19 DIAGNOSIS — R404 Transient alteration of awareness: Secondary | ICD-10-CM | POA: Diagnosis not present

## 2016-10-19 DIAGNOSIS — S01511A Laceration without foreign body of lip, initial encounter: Secondary | ICD-10-CM

## 2016-10-19 DIAGNOSIS — M5431 Sciatica, right side: Secondary | ICD-10-CM | POA: Diagnosis not present

## 2016-10-19 DIAGNOSIS — S0121XA Laceration without foreign body of nose, initial encounter: Secondary | ICD-10-CM | POA: Diagnosis not present

## 2016-10-19 DIAGNOSIS — E559 Vitamin D deficiency, unspecified: Secondary | ICD-10-CM | POA: Diagnosis not present

## 2016-10-19 DIAGNOSIS — R55 Syncope and collapse: Secondary | ICD-10-CM | POA: Diagnosis not present

## 2016-10-19 DIAGNOSIS — I452 Bifascicular block: Secondary | ICD-10-CM | POA: Diagnosis not present

## 2016-10-19 DIAGNOSIS — X58XXXA Exposure to other specified factors, initial encounter: Secondary | ICD-10-CM | POA: Diagnosis not present

## 2016-10-19 DIAGNOSIS — Z7982 Long term (current) use of aspirin: Secondary | ICD-10-CM | POA: Diagnosis not present

## 2016-10-19 DIAGNOSIS — Z881 Allergy status to other antibiotic agents status: Secondary | ICD-10-CM | POA: Diagnosis not present

## 2016-10-19 DIAGNOSIS — E785 Hyperlipidemia, unspecified: Secondary | ICD-10-CM | POA: Diagnosis not present

## 2016-10-19 DIAGNOSIS — S91102A Unspecified open wound of left great toe without damage to nail, initial encounter: Secondary | ICD-10-CM | POA: Diagnosis not present

## 2016-10-19 DIAGNOSIS — M8580 Other specified disorders of bone density and structure, unspecified site: Secondary | ICD-10-CM | POA: Insufficient documentation

## 2016-10-19 DIAGNOSIS — Z885 Allergy status to narcotic agent status: Secondary | ICD-10-CM | POA: Insufficient documentation

## 2016-10-19 DIAGNOSIS — E119 Type 2 diabetes mellitus without complications: Secondary | ICD-10-CM | POA: Insufficient documentation

## 2016-10-19 DIAGNOSIS — Z809 Family history of malignant neoplasm, unspecified: Secondary | ICD-10-CM | POA: Insufficient documentation

## 2016-10-19 DIAGNOSIS — S91209A Unspecified open wound of unspecified toe(s) with damage to nail, initial encounter: Secondary | ICD-10-CM

## 2016-10-19 DIAGNOSIS — S99922A Unspecified injury of left foot, initial encounter: Secondary | ICD-10-CM | POA: Diagnosis not present

## 2016-10-19 LAB — CBC
HCT: 40 % (ref 36.0–46.0)
HEMOGLOBIN: 13.6 g/dL (ref 12.0–15.0)
MCH: 30.9 pg (ref 26.0–34.0)
MCHC: 34 g/dL (ref 30.0–36.0)
MCV: 90.9 fL (ref 78.0–100.0)
Platelets: 276 10*3/uL (ref 150–400)
RBC: 4.4 MIL/uL (ref 3.87–5.11)
RDW: 14 % (ref 11.5–15.5)
WBC: 11.5 10*3/uL — ABNORMAL HIGH (ref 4.0–10.5)

## 2016-10-19 LAB — CBG MONITORING, ED: Glucose-Capillary: 109 mg/dL — ABNORMAL HIGH (ref 65–99)

## 2016-10-19 LAB — BASIC METABOLIC PANEL
ANION GAP: 11 (ref 5–15)
BUN: 16 mg/dL (ref 6–20)
CALCIUM: 9.3 mg/dL (ref 8.9–10.3)
CHLORIDE: 107 mmol/L (ref 101–111)
CO2: 26 mmol/L (ref 22–32)
Creatinine, Ser: 1 mg/dL (ref 0.44–1.00)
GFR calc non Af Amer: 50 mL/min — ABNORMAL LOW (ref >60)
GFR, EST AFRICAN AMERICAN: 58 mL/min — AB (ref >60)
GLUCOSE: 109 mg/dL — AB (ref 65–99)
Potassium: 3.2 mmol/L — ABNORMAL LOW (ref 3.5–5.1)
Sodium: 144 mmol/L (ref 135–145)

## 2016-10-19 LAB — MRSA PCR SCREENING: MRSA by PCR: NEGATIVE

## 2016-10-19 LAB — TROPONIN I: Troponin I: <0.03 ng/mL (ref <0.03)

## 2016-10-19 MED ORDER — POTASSIUM CHLORIDE CRYS ER 20 MEQ PO TBCR
40.0000 meq | EXTENDED_RELEASE_TABLET | Freq: Once | ORAL | Status: AC
Start: 2016-10-19 — End: 2016-10-19
  Administered 2016-10-19: 40 meq via ORAL
  Filled 2016-10-19: qty 2

## 2016-10-19 MED ORDER — TETANUS-DIPHTH-ACELL PERTUSSIS 5-2.5-18.5 LF-MCG/0.5 IM SUSP
0.5000 mL | Freq: Once | INTRAMUSCULAR | Status: AC
  Administered 2016-10-19: 0.5 mL via INTRAMUSCULAR
  Filled 2016-10-19: qty 0.5

## 2016-10-19 MED ORDER — ACETAMINOPHEN 325 MG PO TABS: 650.0000 mg | ORAL_TABLET | Freq: Four times a day (QID) | ORAL | Status: DC | PRN

## 2016-10-19 MED ORDER — SODIUM CHLORIDE 0.9 % IV SOLN
INTRAVENOUS | Status: AC
  Administered 2016-10-19 (×2): via INTRAVENOUS

## 2016-10-19 MED ORDER — HYDRALAZINE HCL 20 MG/ML IJ SOLN: 5.0000 mg | Freq: Four times a day (QID) | INTRAMUSCULAR | Status: DC | PRN

## 2016-10-19 MED ORDER — SODIUM CHLORIDE 0.9% FLUSH: 3.0000 mL | Freq: Two times a day (BID) | INTRAVENOUS | Status: DC

## 2016-10-19 MED ORDER — ASPIRIN EC 81 MG PO TBEC
81.0000 mg | DELAYED_RELEASE_TABLET | Freq: Every day | ORAL | Status: DC
  Administered 2016-10-19 – 2016-10-20 (×2): 81 mg via ORAL
  Filled 2016-10-19 (×2): qty 1

## 2016-10-19 MED ORDER — MIRTAZAPINE 15 MG PO TABS
15.0000 mg | ORAL_TABLET | Freq: Every day | ORAL | Status: DC
  Administered 2016-10-19: 15 mg via ORAL
  Filled 2016-10-19: qty 1

## 2016-10-19 MED ORDER — DONEPEZIL HCL 10 MG PO TABS
10.0000 mg | ORAL_TABLET | Freq: Every day | ORAL | Status: DC
  Administered 2016-10-19: 10 mg via ORAL
  Filled 2016-10-19: qty 1

## 2016-10-19 MED ORDER — ENSURE ENLIVE PO LIQD: 237.0000 mL | Freq: Two times a day (BID) | ORAL | Status: DC

## 2016-10-19 MED ORDER — LEVOTHYROXINE SODIUM 75 MCG PO TABS
150.0000 ug | ORAL_TABLET | Freq: Every day | ORAL | Status: DC
  Administered 2016-10-19 – 2016-10-20 (×2): 150 ug via ORAL
  Filled 2016-10-19: qty 6
  Filled 2016-10-19: qty 2

## 2016-10-19 MED ORDER — CITALOPRAM HYDROBROMIDE 20 MG PO TABS
20.0000 mg | ORAL_TABLET | Freq: Every day | ORAL | Status: DC
  Administered 2016-10-20: 20 mg via ORAL
  Filled 2016-10-19: qty 1

## 2016-10-19 MED ORDER — ACETAMINOPHEN 650 MG RE SUPP: 650.0000 mg | Freq: Four times a day (QID) | RECTAL | Status: DC | PRN

## 2016-10-19 MED ORDER — VITAMIN B-12 1000 MCG PO TABS
1000.0000 ug | ORAL_TABLET | Freq: Every day | ORAL | Status: DC
  Administered 2016-10-19 – 2016-10-20 (×2): 1000 ug via ORAL
  Filled 2016-10-19 (×2): qty 1

## 2016-10-19 MED ORDER — VITAMIN D 1000 UNITS PO TABS
5000.0000 [IU] | ORAL_TABLET | Freq: Every day | ORAL | Status: DC
Start: 2016-10-19 — End: 2016-10-20
  Administered 2016-10-19 – 2016-10-20 (×2): 5000 [IU] via ORAL
  Filled 2016-10-19 (×2): qty 5

## 2016-10-19 MED ORDER — CITALOPRAM HYDROBROMIDE 20 MG PO TABS
40.0000 mg | ORAL_TABLET | Freq: Every day | ORAL | Status: DC
  Administered 2016-10-19: 40 mg via ORAL
  Filled 2016-10-19: qty 2

## 2016-10-19 MED ORDER — OMEGA-3-ACID ETHYL ESTERS 1 G PO CAPS
1.0000 g | ORAL_CAPSULE | Freq: Every day | ORAL | Status: DC
  Administered 2016-10-19 – 2016-10-20 (×2): 1 g via ORAL
  Filled 2016-10-19 (×2): qty 1

## 2016-10-19 MED ORDER — ENOXAPARIN SODIUM 40 MG/0.4ML ~~LOC~~ SOLN
40.0000 mg | SUBCUTANEOUS | Status: DC
  Administered 2016-10-19 – 2016-10-20 (×2): 40 mg via SUBCUTANEOUS
  Filled 2016-10-19 (×2): qty 0.4

## 2016-10-19 MED ORDER — HYDROCODONE-ACETAMINOPHEN 5-325 MG PO TABS
1.0000 | ORAL_TABLET | Freq: Once | ORAL | Status: AC
  Administered 2016-10-19: 1 via ORAL
  Filled 2016-10-19: qty 1

## 2016-10-19 NOTE — ED Notes (Signed)
Patient transported to X-ray 

## 2016-10-19 NOTE — ED Notes (Signed)
Patient transported to CT 

## 2016-10-19 NOTE — ED Provider Notes (Signed)
[facility] DEPT Provider Note   CSN: [id number] Arrival date & time: 10/19/16  0335     History   Chief Complaint Chief Complaint  Patient presents with  . Fall  . Loss of Consciousness    HPI MAANVI WENTZELL is a 82 y.o. female.  The history is provided by the patient and a relative.  Fall  This is a new problem. Episode onset: just prior to arrival. The problem has been gradually improving. Pertinent negatives include no chest pain and no headaches. Exacerbated by: standing. The symptoms are relieved by rest.  Loss of Consciousness   This is a new problem. The current episode started 1 to 2 hours ago. The problem occurs rarely. The problem has been gradually improving. Pertinent negatives include chest pain and headaches.   Patient is an 82 yo female with h/o Diabetes, HTN, presents from nursing facility after fall/syncopal event Apparently she stood up from bed and passed out and she had +LOC She can not recall all of the events She sustained laceration to lip/nose during fall She also injured her left great toe She denies cp/sob No abd pain No neck pain is reported She is not on anticoagulants  Per nursing report pt also had a syncopal episode while she was with EMS Past Medical History:  Diagnosis Date  . Breast cancer ([place])    Mastectomy and chemotherapy  . Diabetes mellitus ([place])    Diet controlled  . GERD (gastroesophageal reflux disease)   . Hyperlipidemia   . Hypertension   . Hypothyroid   . Lymphoma [facility])    Chemotherapy    Patient Active Problem List   Diagnosis Date Noted  . Syncope 10/19/2016  . Chronic sciatica of right side 06/03/2015  . Medication management 03/29/2014  . GERD 03/29/2014  . Hyperlipidemia 10/01/2013  . Prediabetes 10/01/2013  . Vitamin D deficiency 10/01/2013  . Hypothyroidism 10/01/2013  . Breast cancer ([place]) 10/01/2013  . Lymphoma ([place]) 10/01/2013  . Essential hypertension 09/30/2013    Past Surgical History:    Procedure Laterality Date  . APPENDECTOMY    . CHOLECYSTECTOMY    . MASTECTOMY    . TONSILLECTOMY      OB History    No data available       Home Medications    Prior to Admission medications   Medication Sig Start Date End Date Taking? Authorizing Provider  acetaminophen (TYLENOL) 500 MG tablet Take 1,000 mg by mouth 3 (three) times daily.   Yes Historical Provider, MD  aspirin 81 MG tablet Take 81 mg by mouth daily.   Yes Historical Provider, MD  Cholecalciferol (VITAMIN D3) 5000 UNITS CAPS Take 5,000 Units by mouth daily.   Yes Historical Provider, MD  citalopram (CELEXA) 20 MG tablet Take 1 tablet daily for depression Patient taking differently: Take 40 mg by mouth daily. Take 1 tablet daily for depression 06/30/15 10/19/16 Yes Unk Pinto, MD  donepezil (ARICEPT) 10 MG tablet Take 10 mg by mouth at bedtime.   Yes Historical Provider, MD  levothyroxine (SYNTHROID) 150 MCG tablet Take 1 tablet (150 mcg total) by mouth daily. Patient taking differently: Take 75 mcg by mouth 2 (two) times daily.  01/23/15 10/19/16 Yes Courtney Forcucci, PA-C  mirtazapine (REMERON) 15 MG tablet Take 15 mg by mouth at bedtime.   Yes Historical Provider, MD  Omega-3 Fatty Acids (FISH OIL) 1000 MG CAPS Take 1,000 mg by mouth daily.    Yes Historical Provider, MD  vitamin B-12 (CYANOCOBALAMIN) 1000  MCG tablet Take 1,000 mcg by mouth daily.   Yes Historical Provider, MD    Family History Family History  Problem Relation Age of Onset  . Heart disease Father     Rhythm disturbance  . Hypertension Father   . Cancer Mother   . Cancer Sister   . Hypertension Daughter   . Hyperlipidemia Daughter   . Hypertension Sister   . Stroke Sister     Social History Social History  Substance Use Topics  . Smoking status: Never Smoker  . Smokeless tobacco: Not on file  . Alcohol use No     Allergies   Codeine and Macrobid [nitrofurantoin macrocrystal]   Review of Systems Review of Systems   Cardiovascular: Positive for syncope. Negative for chest pain.  Musculoskeletal: Negative for neck pain.  Skin: Positive for wound.  Neurological: Positive for syncope. Negative for headaches.  All other systems reviewed and are negative.    Physical Exam Updated Vital Signs BP 127/73 (BP Location: Left Arm)   Pulse 61   Temp 98.2 F (36.8 C) (Oral)   Resp 19   Ht 5\' 6"  (1.676 m)   Wt 67.6 kg   SpO2 94%   BMI 24.05 kg/m   Physical Exam CONSTITUTIONAL: Elderly, no acute disterss HEAD: Normocephalic/atraumatic EYES: EOMI/PERRL ENMT: Mucous membranes moist, small well approximated laceration to bridge of nose.  No other facial/nasal tenderness.  Small laceration to left upper lip.  No dental injury noted NECK: supple no meningeal signs SPINE/BACK:entire spine nontender, no cervical spine tenderness noted, No bruising/crepitance/stepoffs noted to spine CV: S1/S2 noted, no murmurs LUNGS: Lungs are clear to auscultation bilaterally, no apparent distress ABDOMEN: soft, nontender GU:no cva tenderness NEURO: Pt is awake/alert/appropriate, moves all extremitiesx4.  No facial droop.  No focal weakness noted EXTREMITIES: pulses normal/equal, full ROM, partially avulsed toenail to left great toe, tenderness to left great toe, no other injuries noted SKIN: warm, color normal PSYCH: no abnormalities of mood noted, alert and oriented to situation   ED Treatments / Results  Labs (all labs ordered are listed, but only abnormal results are displayed) Labs Reviewed  BASIC METABOLIC PANEL - Abnormal; Notable for the following:       Result Value   Potassium 3.2 (*)    Glucose, Bld 109 (*)    GFR calc non Af Amer 50 (*)    GFR calc Af Amer 58 (*)    All other components within normal limits  CBC - Abnormal; Notable for the following:    WBC 11.5 (*)    All other components within normal limits  CBG MONITORING, ED - Abnormal; Notable for the following:    Glucose-Capillary 109 (*)     All other components within normal limits  TROPONIN I  URINALYSIS, ROUTINE W REFLEX MICROSCOPIC    EKG  EKG Interpretation  Date/Time:  Tuesday October 19 2016 03:45:44 EST Ventricular Rate:  66 PR Interval:    QRS Duration: 128 QT Interval:  461 QTC Calculation: 484 R Axis:   -49 Text Interpretation:  Sinus rhythm Atrial premature complexes RBBB and LAFB Probable left ventricular hypertrophy No significant change since last tracing Confirmed by Christy Gentles  MD, [place] ([id number]) on 10/19/2016 3:49:21 AM       Radiology Ct Head Wo Contrast  Result Date: 10/19/2016 CLINICAL DATA:  Loss of consciousness EXAM: CT HEAD WITHOUT CONTRAST TECHNIQUE: Contiguous axial images were obtained from the base of the skull through the vertex without intravenous contrast. COMPARISON:  05/28/2015 FINDINGS:  BRAIN: There is sulcal and ventricular prominence consistent with superficial and central atrophy. No intraparenchymal hemorrhage, mass effect nor midline shift. There are patchy periventricular and subcortical white matter hypodensities consistent with chronic small vessel ischemic disease. No acute large vascular territory infarcts. No abnormal extra-axial fluid collections. Basal cisterns are patent. VASCULAR: Moderate calcific atherosclerosis of the carotid siphons. SKULL: No skull fracture. No significant scalp soft tissue swelling. SINUSES/ORBITS: The mastoid air-cells are clear. The included paranasal sinuses are well-aerated.The included ocular globes and orbital contents are non-suspicious. Bilateral lens replacements surgeries. OTHER: None. IMPRESSION: Atrophy with chronic small vessel ischemic disease of periventricular white matter. No acute intracranial abnormality. Electronically Signed   By: Ashley Royalty M.D.   On: 10/19/2016 04:22   Dg Toe Great Left  Result Date: 10/19/2016 CLINICAL DATA:  82 year old female with injury to the left great toe with partially detached toenail. EXAM: LEFT GREAT  TOE COMPARISON:  None. FINDINGS: There is no acute fracture or dislocation. The bones are osteopenic. There is osteophyte in the distal second and third metatarsal. There is soft tissue swelling of the great toe. There is elevation of the toenail with air under the toenail. IMPRESSION: No acute fracture or dislocation. Osteopenia. Soft tissue swelling of the great toe with elevation of the toenail. Correlation with clinical exam recommended. Electronically Signed   By: Anner Crete M.D.   On: 10/19/2016 05:16    Procedures Procedures  LACERATION REPAIR Performed by: Sharyon Cable Consent: Verbal consent obtained. Risks and benefits: risks, benefits and alternatives were discussed Patient identity confirmed: provided demographic data Prepped and Draped in normal sterile fashion Wound explored Laceration Location: left upper lip Laceration Length: 1cm No Foreign Bodies seen or palpated Anesthesia: local infiltration Local anesthetic: lidocaine % without epinephrine Anesthetic total: 4 ml Amount of cleaning: standard Skin closure: simple Number of sutures or staples: 3 vicryl Technique: interrupted Patient tolerance: Patient tolerated the procedure well with no immediate complications.   LACERATION REPAIR Performed by: Sharyon Cable Consent: Verbal consent obtained. Risks and benefits: risks, benefits and alternatives were discussed Patient identity confirmed: provided demographic data Prepped and Draped in normal sterile fashion Wound explored Laceration Location: bridge of nose Laceration Length: 0.5cm No Foreign Bodies seen or palpated Amount of cleaning: standard Skin closure: simple Number of sutures or staples: 2 steri strips Patient tolerance: Patient tolerated the procedure well with no immediate complications.   Medications Ordered in ED Medications  Tdap (BOOSTRIX) injection 0.5 mL (0.5 mLs Intramuscular Given 10/19/16 0443)  HYDROcodone-acetaminophen  (NORCO/VICODIN) 5-325 MG per tablet 1 tablet (1 tablet Oral Given 10/19/16 0443)     Initial Impression / Assessment and Plan / ED Course  I have reviewed the triage vital signs and the nursing notes.  Pertinent labs & imaging results that were available during my care of the patient were reviewed by me and considered in my medical decision making (see chart for details).  Clinical Course     7:59 AM Pt in the ED s/p fall and syncopal episode  It appears she may have had 2 episodes of syncope She denies active CP CT head negative No spinal tenderness, therefore imaging deferred Pt will need to be admitted for syncopal episode She would benefit from cardiac monitoring and potentially echocardiogram Pt is unassigned (has not seen PCP in >1 year) and now sees [facility] which is unassigned D/w internal medicine team for admission  As for nail, it was cleansed and attempted to reinsert (partially avulsed) and  was wrapped in gauze, pt tolerated well, she can f/u with podiatry for this   Final Clinical Impressions(s) / ED Diagnoses   Final diagnoses:  Syncope and collapse  Lip laceration, initial encounter  Avulsion of toenail, initial encounter    New Prescriptions New Prescriptions   No medications on file     Ripley Fraise, MD 10/19/16 [phone]

## 2016-10-19 NOTE — Progress Notes (Signed)
Pt has arrived to 2w from the [facility] emergency department. Telemetry box applied and CCMD notified. Skin assessment completed with 2 RNs. Pt oriented to room. Pt's vitals are stable. Breakfast tray was ordered for pt. Will continue current plan of care.  Grant Fontana BSN, RN

## 2016-10-19 NOTE — Evaluation (Signed)
Physical Therapy Evaluation Patient Details Name: Janet Reeves MRN: [id number] DOB: 1929-06-16 Today's Date: 10/19/2016   History of Present Illness  Patient is a 82 y/o female with hx of HTN, HLD, DM, breast ca, lymphoma presents s/p syncope x2.   Clinical Impression  Patient presents with asymptomatic orthostatic hypotension limiting mobility assessment.  Supine BP 144/83 Sitting BP 122/64 Standing BP 96/56 Might benefit from donning ted hose and ace wraps prior to mobility to stabilize BP. RN notified of recommendations to MD. Pt from ALF and is Mod I for mobility/ADls. Reports these syncopal episodes have happened before. Due to above, pt is risk for further syncopal episodes. Will follow acutely to maximize independence and mobility and perform further evaluation of mobility prior to return home.     Follow Up Recommendations Home health PT;Supervision for mobility/OOB    Equipment Recommendations  None recommended by PT    Recommendations for Other Services OT consult     Precautions / Restrictions Precautions Precautions: Fall Precaution Comments: orthostatic hypotension Restrictions Weight Bearing Restrictions: No      Mobility  Bed Mobility Overal bed mobility: Needs Assistance Bed Mobility: Supine to Sit     Supine to sit: Modified independent (Device/Increase time);HOB elevated     General bed mobility comments: No assist needed. USe of rail for support. No dizziness.  Transfers Overall transfer level: Needs assistance Equipment used: None Transfers: Sit to/from Stand Sit to Stand: Min guard         General transfer comment: Min guard to steady in standing. Stood from [employer]. No dizziness.  Ambulation/Gait Ambulation/Gait assistance:  (Deferred secondary to orthostatic hypotension.)              Stairs            Wheelchair Mobility    Modified Rankin (Stroke Patients Only)       Balance Overall balance assessment: Needs  assistance Sitting-balance support: Feet supported;No upper extremity supported Sitting balance-Leahy Scale: Fair     Standing balance support: During functional activity Standing balance-Leahy Scale: Fair Standing balance comment: Sway noted during standing, LEs resting on bed rails.                             Pertinent Vitals/Pain Pain Assessment: No/denies pain    Home Living Family/patient expects to be discharged to:: Assisted living ([facility])               Home Equipment: Gilford Rile - 2 wheels;Cane - single point      Prior Function Level of Independence: Independent with assistive device(s)         Comments: Uses SPC PRN for longer distances. Daughter reports some falls. Walks to [employer] for meals, cooks some.     Hand Dominance        Extremity/Trunk Assessment   Upper Extremity Assessment:  (Intention tremor noted BUEs when drinking water)           Lower Extremity Assessment: Generalized weakness      Cervical / Trunk Assessment: Kyphotic  Communication   Communication: No difficulties  Cognition Arousal/Alertness: Awake/alert Behavior During Therapy: WFL for tasks assessed/performed Overall Cognitive Status: Within Functional Limits for tasks assessed                      General Comments      Exercises     Assessment/Plan    PT Assessment Patient  needs continued PT services  PT Problem List Decreased strength;Decreased mobility;Decreased safety awareness;Decreased balance;Decreased activity tolerance;Cardiopulmonary status limiting activity;Decreased cognition;Decreased coordination          PT Treatment Interventions Therapeutic activities;Gait training;Therapeutic exercise;Patient/family education;Balance training;Functional mobility training    PT Goals (Current goals can be found in the Care Plan section)  Acute Rehab PT Goals Patient Stated Goal: to go home PT Goal Formulation: With  patient Time For Goal Achievement: 11/02/16 Potential to Achieve Goals: Good    Frequency Min 3X/week   Barriers to discharge        Co-evaluation               End of Session   Activity Tolerance: Treatment limited secondary to medical complications (Comment) (orthostatic hypotension) Patient left: in bed;with call bell/phone within reach;with family/visitor present (sitting EOB.) Nurse Communication: Mobility status         Time: [id number] PT Time Calculation (min) (ACUTE ONLY): 23 min   Charges:   PT Evaluation $PT Eval Low Complexity: 1 Procedure PT Treatments $Therapeutic Activity: 8-22 mins   PT G Codes:        Courtlyn Aki A Trude Cansler 10/19/2016, 4:25 PM Wray Kearns, [place], DPT [phone]

## 2016-10-19 NOTE — ED Notes (Signed)
Pt did not feel comfortable standing for the third blood pressure during orthostatic vital signs, due to her injured foot / toe.  Informed Whitney, RN.

## 2016-10-19 NOTE — ED Triage Notes (Signed)
Per [facility]. Pt coming from [place] [facility] side.Marland Kitchen 911 called out for unwitness fall. Pt got off of bathroom floor staff discovered blood on her face and R great toe. Pt went to bathroom had incontinence episode with EMS and had a syncopal episode and EMS was able to catch her. Pt altered. A&O x1. Hypertensive. Sinus on monitor. 92% RA.  20G LW   500cc bolus

## 2016-10-19 NOTE — H&P (Signed)
Date: 10/19/2016               Patient Name:  Janet Reeves MRN: [id number]  DOB: 05/01/1929 Age / Sex: 82 y.o., female   PCP: Unk Pinto, MD         Medical Service: Internal Medicine Teaching Service         Attending Physician: Dr. Aldine Contes, MD    First Contact: Dr. Philipp Ovens Pager: [phone]  Second Contact: Dr. Charlynn Grimes Pager: [phone]       After Hours (After 5p/  First Contact Pager: [phone]  weekends / holidays): Second Contact Pager: [phone]   Chief Complaint: Syncope   History of Present Illness: Janet Reeves is a 82yo woman with PMHx of HTN, diet controlled type 2 DM, hypothyroidism, Non-Hodgkin's lymphoma s/p chemo, and breast cancer s/p right mastectomy and chemo presenting after a syncopal event this morning. She reports she got up from her bed around 2 AM to use the restroom when she suddenly passed out. She states she woke up very quickly and knew her surroundings. She is unsure if she lost consciousness completely. She did hit the right side of her head and has lacerations on the bridge of her nose and lip. She also has a toenail injury to the left big toe. She denies any dizziness, lightheadedness, vertigo, palpitations, or changes in vision prior to the event. Her ALF called EMS because they found her lying on the floor. Report from EMS states that she had incontinence and then a second brief syncopal event when they evaluated her. EMS was able to catch her before she fell. No seizure- like activity witnessed. Her daughter is at the bedside and states her mother has had syncopal events in the past. Last episode was about 10 years ago and another episode one year prior to that one. Daughter states "they never were able to figure out what was wrong." Daughter states she has no seizure history. Patient denies any changes in speech, weakness, numbness/tingling, chest pain, SOB, nausea/vomiting, abdominal pain, or dysuria.   In the ED, she could not stand to complete  her orthostatic vital signs but they were positive with lying BP 147/68 and sitting BP 117/75. CT head was performed and was negative for any acute process. EKG shows sinus rhythm, RBBB, and LAFB which are present on prior EKGs.   Meds:  Current Meds  Medication Sig  . acetaminophen (TYLENOL) 500 MG tablet Take 1,000 mg by mouth 3 (three) times daily.  Marland Kitchen aspirin 81 MG tablet Take 81 mg by mouth daily.  . Cholecalciferol (VITAMIN D3) 5000 UNITS CAPS Take 5,000 Units by mouth daily.  . citalopram (CELEXA) 20 MG tablet Take 1 tablet daily for depression (Patient taking differently: Take 40 mg by mouth daily. Take 1 tablet daily for depression)  . donepezil (ARICEPT) 10 MG tablet Take 10 mg by mouth at bedtime.  Marland Kitchen levothyroxine (SYNTHROID) 150 MCG tablet Take 1 tablet (150 mcg total) by mouth daily. (Patient taking differently: Take 75 mcg by mouth 2 (two) times daily. )  . mirtazapine (REMERON) 15 MG tablet Take 15 mg by mouth at bedtime.  . Omega-3 Fatty Acids (FISH OIL) 1000 MG CAPS Take 1,000 mg by mouth daily.   . vitamin B-12 (CYANOCOBALAMIN) 1000 MCG tablet Take 1,000 mcg by mouth daily.     Allergies: Allergies as of 10/19/2016 - Review Complete 10/19/2016  Allergen Reaction Noted  . Codeine Nausea And Vomiting 03/29/2013  . Macrobid [nitrofurantoin  macrocrystal] Other (See Comments) 09/28/2013   Past Medical History:  Diagnosis Date  . Breast cancer ([place])    Mastectomy and chemotherapy  . Diabetes mellitus ([place])    Diet controlled  . GERD (gastroesophageal reflux disease)   . Hyperlipidemia   . Hypertension   . Hypothyroid   . Lymphoma [facility])    Chemotherapy    Family History: Mother- cancer. Father- heart disease, HTN. Sister- HTN, stroke.   Social History: Lives at ALF. Widowed. Has 1 son and 1 daughter. Never smoker. Denies alcohol use.   Review of Systems: A complete ROS was negative except as per HPI.   Physical Exam: Blood pressure 132/74, pulse 74, temperature  98.2 F (36.8 C), temperature source Oral, resp. rate 15, height 5\' 6"  (1.676 m), weight 149 lb (67.6 kg), SpO2 95 %. General: elderly woman sitting up in bed, NAD, pleasant  HEENT: Mild tenderness to palpation of right frontotemporal region of head, blood in hair. Small lacerations s/p repair present on left upper lip and bridge of nose. No blood in oral cavity. EOMI, PERRL. Mucus membranes moist.  CV: RRR, no m/g/r Pulm: CTA bilaterally, breaths non-labored Abd: BS+, soft, non-tender Ext: warm, no peripheral edema Neuro: alert and oriented x 3. Smile symmetric. Shoulder shrug intact. Tongue midline. Strength 5/5 in upper and lower extremities bilaterally. Sensation symmetric and intact.   EKG: Sinus rhythm. RBBB and LAFB, old. Unchanged from prior EKG.  CT head: Chronic small vessel disease. No acute abnormalities. X-ray L Great Toe: Soft tissue swelling, elevation of the toenail.    Assessment & Plan by Problem:  Syncope: Patient presented after 2 syncopal events this morning. After reviewing her records, she does have a long history of syncopal events. She has had extensive work up in the past, including echo, CT head, MRI brain, MRA head/neck, and holtor monitoring all of which were unrevealing. She had 2 prior syncopal events that were in the setting of infection (diverticulitis, UTI). She does not complain of any other symptoms. Her orthostatic vital signs are positive which could be contributing. EKG shows a RBBB and LAFB which are not new. She has been experiencing these intermittent episodes of syncope for the last 15 years or more and episodes seem to be infrequent as her last one was about 10 years ago.  Will check a UA, start IVFs, and monitor her on telemetry overnight. Could consider doing another Holtor monitor trial if episodes start to occur more frequently. - Observe on telemetry - Start NS at 75 ml/hr - Repeat orthostatics in AM - Check UA  Hypokalemia: K 3.2 on admission.  Repleted with K-Dur 40 mEq once.  - Check bmet in AM  HTN: BPs stable in [id number] systolic. She is not taking any antihypertensives currently. - Continue to monitor  Type 2 DM, diet controlled: Last A1c 6.0 in June 2016. She is currently diet controlled. CBG 109 on admission.  - Monitor glucose on bmet - Add ISS if CBGs >180  Hypothyroidism: Last TSH 1.725 in 2016. She follows with a PCP.  - Continue home dose Synthroid 150 mcg daily    Diet: Heart healthy DVT Ppx: Lovenox SQ Code Status: Discussed code status extensively with patient and daughter, who is the patient's [place]. Daughter states she reviewed the patient's living will and the patient does not wish to have any "heroic" measures. Patient confirms that she wants DNR status.  Dispo: Admit patient to Observation with expected length of stay less than 2 midnights.  Signed: Juliet Rude, MD  Internal Medicine Resident, PGY-III 10/19/2016, 8:31 AM  Pager: [phone]

## 2016-10-19 NOTE — ED Notes (Signed)
CBG: 109 Rn notified.

## 2016-10-20 DIAGNOSIS — B9689 Other specified bacterial agents as the cause of diseases classified elsewhere: Secondary | ICD-10-CM

## 2016-10-20 DIAGNOSIS — N390 Urinary tract infection, site not specified: Secondary | ICD-10-CM

## 2016-10-20 DIAGNOSIS — I951 Orthostatic hypotension: Secondary | ICD-10-CM | POA: Diagnosis not present

## 2016-10-20 LAB — BASIC METABOLIC PANEL
Anion gap: 9 (ref 5–15)
BUN: 14 mg/dL (ref 6–20)
CALCIUM: 8.8 mg/dL — AB (ref 8.9–10.3)
CHLORIDE: 111 mmol/L (ref 101–111)
CO2: 24 mmol/L (ref 22–32)
CREATININE: 0.83 mg/dL (ref 0.44–1.00)
GFR calc Af Amer: >60 mL/min (ref >60)
GFR calc non Af Amer: >60 mL/min (ref >60)
GLUCOSE: 105 mg/dL — AB (ref 65–99)
Potassium: 3.3 mmol/L — ABNORMAL LOW (ref 3.5–5.1)
Sodium: 144 mmol/L (ref 135–145)

## 2016-10-20 LAB — CBC
HEMATOCRIT: 33 % — AB (ref 36.0–46.0)
HEMOGLOBIN: 11 g/dL — AB (ref 12.0–15.0)
MCH: 30.1 pg (ref 26.0–34.0)
MCHC: 33.3 g/dL (ref 30.0–36.0)
MCV: 90.4 fL (ref 78.0–100.0)
Platelets: 270 10*3/uL (ref 150–400)
RBC: 3.65 MIL/uL — ABNORMAL LOW (ref 3.87–5.11)
RDW: 14.1 % (ref 11.5–15.5)
WBC: 7.9 10*3/uL (ref 4.0–10.5)

## 2016-10-20 LAB — URINALYSIS, ROUTINE W REFLEX MICROSCOPIC
Bilirubin Urine: NEGATIVE
Glucose, UA: NEGATIVE mg/dL
HGB URINE DIPSTICK: NEGATIVE
Ketones, ur: NEGATIVE mg/dL
Nitrite: POSITIVE — AB
Protein, ur: NEGATIVE mg/dL
SPECIFIC GRAVITY, URINE: 1.014 (ref 1.005–1.030)
pH: 5 (ref 5.0–8.0)

## 2016-10-20 MED ORDER — SULFAMETHOXAZOLE-TRIMETHOPRIM 800-160 MG PO TABS: 1.0000 | ORAL_TABLET | Freq: Two times a day (BID) | ORAL | 0 refills | Status: DC

## 2016-10-20 MED ORDER — SODIUM CHLORIDE 0.9 % IV BOLUS (SEPSIS)
500.0000 mL | Freq: Once | INTRAVENOUS | Status: AC
  Administered 2016-10-20: 500 mL via INTRAVENOUS

## 2016-10-20 MED ORDER — POTASSIUM CHLORIDE CRYS ER 20 MEQ PO TBCR
40.0000 meq | EXTENDED_RELEASE_TABLET | Freq: Once | ORAL | Status: AC
  Administered 2016-10-20: 40 meq via ORAL
  Filled 2016-10-20: qty 2

## 2016-10-20 MED ORDER — SULFAMETHOXAZOLE-TRIMETHOPRIM 800-160 MG PO TABS
1.0000 | ORAL_TABLET | Freq: Two times a day (BID) | ORAL | Status: DC
  Administered 2016-10-20: 1 via ORAL
  Filled 2016-10-20: qty 1

## 2016-10-20 NOTE — Progress Notes (Signed)
   Subjective:  No acute events overnight. Orthostatic vital signs positive again this morning and she was given another 500 cc bolus. Repeat orthostatics improved. Patient reports she feels "great" this morning. Denies any lightheadedness or dizziness.   Objective:  Vital signs in last 24 hours: Vitals:   10/19/16 1321 10/19/16 1453 10/19/16 1944 10/20/16 0649  BP: (!) 177/80 (!) 148/58 (!) 129/58 (!) 130/59  Pulse: 85 (!) 101 80 71  Resp: 18  18 18   Temp: 98.4 F (36.9 C)  99 F (37.2 C) 98.3 F (36.8 C)  TempSrc: Oral  Oral Oral  SpO2: 95%  96% 97%  Weight:      Height:       General: elderly woman sitting up in bed, NAD HEENT: Lacerations on lip, right temporal region of head, and bridge of nose s/p repair. Mucus membranes moist. CV: RRR, no m/g/r Pulm: CTA bilaterally Ext: trace peripheral edema, has ted hose on Neuro: alert and oriented x 3  Assessment/Plan:  Syncope: No additional syncopal events since admission. Patient asymptomatic in regards to her orthostatic hypotension. She did have some APCs on her telemetry overnight. It seems unlikely that orthostatic hypotension is the cause for her syncopal event as she did not have prodromal symptoms. Will have her undergo Holtor monitoring as an outpatient to see if any arrhythmias could be the etiology. UA also notable for +nitrites, +leuks, TNTC WBCs, and many bacteria consistent with UTI. Patient did not have any urinary symptoms but given her syncopal episodes I think treatment is warranted.  - Treat with Bactrim for 3 days - Urine culture sent, will need to follow up - Holtor monitor set up with Cards- patient to be contacted - Follow up with PCP in 1-2 weeks  Hypokalemia: K 3.3 this AM. Given another dose of K-Dur 40 mEq.  - Will need repeat bmet as outpatient   HTN: BPs stable in 0000000 systolic. Will need further monitoring as an outpatient.   Type 2 DM, diet controlled: Stable. - Monitor as outpatient    Hypothyroidism: Stable. - Continue Synthroid 150 mcg daily   Diet: Heart healthy DVT Ppx: Lovenox SQ Dispo: Anticipated discharge today  Juliet Rude, MD 10/20/2016, 2:56 PM Pager: [phone]

## 2016-10-20 NOTE — Progress Notes (Addendum)
  Date: 10/20/2016  Patient name: AARA KLEHR  Medical record number: [id number]  Date of birth: December 23, 1928   I have seen and evaluated Fannie Knee and discussed their care with the Residency Team. Patient is an 82 year old female with a past medical history of hypertension, diet-controlled diabetes, non-Hodgkin's lymphoma status post chemotherapy and breast cancer status post right mastectomy and chemotherapy who presented with an episode of syncope yesterday morning.  Patient states that she woke up in the middle of the night to use the restroom and passed out while ambulating to the restroom. Patient denies any presyncopal symptoms. No lightheadedness, no diaphoresis, no palpitations, no chest pain, no shortness of breath. Patient states that when she fell she hit her nose and lip. She says that she almost immediately regained consciousness. Per EMS report they found her lying on the floor and witnessed a second brief syncopal episode when they were evaluating her associated with urinary incontinence. No seizure-like activity was witnessed. Patient states that she does have a history of syncopal episodes but the last one was approximately 10 years ago. No nausea or vomiting, no fevers chills, no abdominal pain, no vertigo, no dysuria.  Patient feels well today with no new complaints. She would like to go home today  PMHx, Fam Hx, and/or Soc Hx : As per resident admit note  Vitals:   10/20/16 0649 10/20/16 1346  BP: (!) 130/59 (!) 142/49  Pulse: 71 85  Resp: 18 18  Temp: 98.3 F (36.8 C) 98.7 F (37.1 C)   Gen.: Awake alert and oriented 3, NAD  CVS: Regular rate and rhythm, normal heart sounds Lungs: CTA bilaterally Abdomen: Soft, nontender, normoactive bowel sounds Extremities: No edema noted. HEENT: Lacerations noted on lip, right temporal region and bridge of nose   Assessment and Plan: I have seen and evaluated the patient as outlined above. I agree with the formulated  Assessment and Plan as detailed in the residents' note, with the following changes:   1. Syncope: - Patient presents with syncope 1 episode. She also had a brief second syncopal episode while being evaluated by EMS. She was noted to be orthostatic on admission. It is possible that the orthostatic hypotension was the etiology of her syncope but she had no prodromal symptoms and is concerning for possible arrhythmia or valvular dysfunction. She was also noted to have a likely urinary tract infection on her UA which could possibly be the etiology for his syncope as well. - Telemetry reviewed. Patient with multiple APCs but no arrhythmia noted - We will arrange for patient to have a Holter monitor as an outpatient to further investigate for possible arrhythmia - I would consider obtaining a 2-D echo as an outpatient as well to evaluate for possibly AS - Orthostatic hypotension now resolved with IV fluids - Follow up with PCP in 1-2 weeks - We'll treat underlying UTI with Bactrim for 3 days. We will follow up urine culture. - Patient is stable for discharge home today  Aldine Contes, MD 12/13/20173:54 PM

## 2016-10-20 NOTE — Discharge Summary (Signed)
Name: Janet Reeves MRN: [id number] DOB: 1929-09-02 82 y.o. PCP: No primary care provider on file.  Date of Admission: 10/19/2016  3:35 AM Date of Discharge: 10/20/2016 Attending Physician: Aldine Contes, MD  Discharge Diagnosis: 1. Syncope 2. UTI  Discharge Medications:   Medication List    TAKE these medications   acetaminophen 500 MG tablet Commonly known as:  TYLENOL Take 1,000 mg by mouth 3 (three) times daily.   aspirin 81 MG tablet Take 81 mg by mouth daily.   citalopram 20 MG tablet Commonly known as:  CELEXA Take 1 tablet daily for depression What changed:  how much to take  how to take this  when to take this  additional instructions   donepezil 10 MG tablet Commonly known as:  ARICEPT Take 10 mg by mouth at bedtime.   Fish Oil 1000 MG Caps Take 1,000 mg by mouth daily.   levothyroxine 150 MCG tablet Commonly known as:  SYNTHROID Take 1 tablet (150 mcg total) by mouth daily. What changed:  how much to take  when to take this   mirtazapine 15 MG tablet Commonly known as:  REMERON Take 15 mg by mouth at bedtime.   sulfamethoxazole-trimethoprim 800-160 MG tablet Commonly known as:  BACTRIM DS,SEPTRA DS Take 1 tablet by mouth every 12 (twelve) hours.   vitamin B-12 1000 MCG tablet Commonly known as:  CYANOCOBALAMIN Take 1,000 mcg by mouth daily.   Vitamin D3 5000 units Caps Take 5,000 Units by mouth daily.       Disposition and follow-up:   Janet Reeves was discharged from [facility] in Stable condition.  At the hospital follow up visit please address:  1.  Syncope: Patient presented after 2 syncopal episodes in the setting of orthostatic hypotension. Improved with IVFs. Please encourage PO intake. She will also need a Holter monitor (ordered on discharge) and outpatient echocardiogram. Patient will be contacted by cardiology for the Holter monitor. Please order echocardiogram to evaluate for aortic stenosis.    2. UTI: Discharged on Bactrim BID x 3 days. Urine cultures and sensitives pending on discharge. We will contact patient with results.   3.  Labs / imaging needed at time of follow-up: Echocardiogram (aortic stenosis), Holter Monitor, BMP (potassium)  4.  Pending labs/ test needing follow-up: Urine culture   Follow-up Appointments:   Hospital Course by problem list:  1. Syncope: Patient presented after 2 syncopal episodes from her assisted living facility. The first episode occurred at 2 AM after patient got up from bed to use the restroom. She denied any preceding symptoms of dizziness or lightheadedness. She said she woke up very quickly after the episode and is not entirely sure if she passed out. Her assisted living facility called EMS. Report from EMS stated that she had incontinence and then a second brief syncopal event when they evaluated her. EMS was able to catch her before she fell. She did have a laceration on her left upper lip and bridge of nose from her first fall that was repaired in the ED. In the ED, she could not stand to complete her orthostatic vital signs but they were positive with lying BP 147/68 and sitting BP 117/75. CT head was performed and was negative for any acute process. EKG shows sinus rhythm, RBBB, and LAFB which are present on prior EKGs. She was admitted for tele monitoring and started on IVFs. Review of tele overnight revealed multiple PACs but no arrhythmias. Orthostatics improved with IVFs  the following day. Patient will need outpatient holter monitor due to her recurrent PACs. Outpatient echocardiogram is also recommended to evaluate for aortic stenosis.   2. UTI: Patient denied dysuria but UA on admission showed 3+ leukocytes, negative nitrites, and >60 wbcs. She was discharged on bactrim BID for 3 days. We will follow up with the results of her urine cultures and sensitives.  3. Hypokalemia: K 3.2 on admission. Repleted with K-Dur 40 mEq once. K up to 3.3  on discharge. Recommend repeat BMP and replete lytes prn.   4. Hypothyroidism: Home synthroid 150 mcg was continued.   Discharge Vitals:   BP (!) 142/49 (BP Location: Left Arm)   Pulse 85   Temp 98.7 F (37.1 C) (Oral)   Resp 18   Ht _0  (1.676 m)   Wt 136 lb 9.6 oz (62 kg)   SpO2 97%   BMI 22.05 kg/m   Pertinent Labs, Studies, and Procedures:   10/19/2016 CT Head Wo Contrast: IMPRESSION: Atrophy with chronic small vessel ischemic disease of periventricular white matter. No acute intracranial abnormality.  10/19/2016 DG Toe Great Left: IMPRESSION: No acute fracture or dislocation.  Osteopenia.  Soft tissue swelling of the great toe with elevation of the toenail. Correlation with clinical exam recommended.  Urinalysis:  Ref. Range 10/19/2016 06:45  Appearance Latest Ref Range: CLEAR  HAZY (A)  Bacteria, UA Latest Ref Range: NONE SEEN  MANY (A)  Bilirubin Urine Latest Ref Range: NEGATIVE  NEGATIVE  Ca Oxalate Crys, UA Unknown PRESENT  Color, Urine Latest Ref Range: YELLOW  YELLOW  Glucose Latest Ref Range: NEGATIVE mg/dL NEGATIVE  Hgb urine dipstick Latest Ref Range: NEGATIVE  NEGATIVE  Hyaline Casts, UA Unknown PRESENT  Ketones, ur Latest Ref Range: NEGATIVE mg/dL NEGATIVE  Leukocytes, UA Latest Ref Range: NEGATIVE  LARGE (A)  Mucous Unknown PRESENT  Nitrite Latest Ref Range: NEGATIVE  POSITIVE (A)  pH Latest Ref Range: 5.0 - 8.0  5.0  Protein Latest Ref Range: NEGATIVE mg/dL NEGATIVE  RBC / HPF Latest Ref Range: 0 - 5 RBC/hpf 6-30  Specific Gravity, Urine Latest Ref Range: 1.005 - 1.030  1.014  Squamous Epithelial / LPF Latest Ref Range: NONE SEEN  0-5 (A)  WBC, UA Latest Ref Range: 0 - 5 WBC/hpf TOO NUMEROUS TO C...    Ref. Range 10/19/2016 03:45 10/20/2016 02:24  Sodium Latest Ref Range: 135 - 145 mmol/L 144 144  Potassium Latest Ref Range: 3.5 - 5.1 mmol/L 3.2 (L) 3.3 (L)  Chloride Latest Ref Range: 101 - 111 mmol/L 107 111  CO2 Latest Ref Range: 22 -  32 mmol/L 26 24  BUN Latest Ref Range: 6 - 20 mg/dL 16 14  Creatinine Latest Ref Range: 0.44 - 1.00 mg/dL 1.00 0.83  Calcium Latest Ref Range: 8.9 - 10.3 mg/dL 9.3 8.8 (L)  EGFR (Non-African Amer.) Latest Ref Range: >60 mL/min 50 (L) >60  EGFR (African American) Latest Ref Range: >60 mL/min 58 (L) >60  Glucose Latest Ref Range: 65 - 99 mg/dL 109 (H) 105 (H)  Anion gap Latest Ref Range: 5 - _1 Ref. Range 10/19/2016 03:45 10/20/2016 02:24  WBC Latest Ref Range: 4.0 - 10.5 K/uL 11.5 (H) 7.9  RBC Latest Ref Range: 3.87 - 5.11 MIL/uL 4.40 3.65 (L)  Hemoglobin Latest Ref Range: 12.0 - 15.0 g/dL 13.6 11.0 (L)  HCT Latest Ref Range: 36.0 - 46.0 % 40.0 33.0 (L)  MCV Latest Ref Range: 78.0 - 100.0 fL  90.9 90.4  MCH Latest Ref Range: 26.0 - 34.0 pg 30.9 30.1  MCHC Latest Ref Range: 30.0 - 36.0 g/dL 34.0 33.3  RDW Latest Ref Range: 11.5 - 15.5 % 14.0 14.1  Platelets Latest Ref Range: 150 - 400 K/uL 276 270   Discharge Instructions: Discharge Instructions    Diet - low sodium heart healthy    Complete by:  As directed    Discharge instructions    Complete by:  As directed    It was a pleasure taking care of you, Ms. Consoli. You were hospitalized for syncope (passing out).   - Based on your urine studies it looks like you have a UTI. We will treat this with Bactrim 800-160 mg (1 pill) twice daily. You will get your first dose in the hospital and then will need to take it every 12 hours for 3 days.  - We also are setting up a Holtor monitor which will track your heart rhythm for 30 days. You will be contacted by the Cardiology office to set this up - Please follow up with your primary care doctor in the next 1-2 weeks  Take care, Dr. Arcelia Jew   Increase activity slowly    Complete by:  As directed       Signed: Velna Ochs, MD 10/20/2016, 5:27 PM   Pager: [phone]

## 2016-10-20 NOTE — Evaluation (Signed)
Occupational Therapy Evaluation Patient Details Name: Janet Reeves MRN: [id number] DOB: 1929/06/05 Today's Date: 10/20/2016    History of Present Illness Patient is a 82 y/o female with hx of HTN, HLD, DM, breast ca, lymphoma presents s/p syncope x2.    Clinical Impression   Pt reports she was independent with BADL PTA; ALF provides meals and cleaning services. Currently pt requires min assist overall for ADL and functional mobility. Pt with episode of feeling like she is about to "go out" during grooming task standing at the sink; pt returned to sitting then to supine position with resolution of symptoms. Pt planning to return to ALF upon d/c. Pending progress; pt may need higher level of care until she is no longer a high fall risk due to syncopal episodes. Recommending HHOT for follow up to maximize independence and safety with ADL and functional mobility upon return home. Pt would benefit from continued skilled OT to address established goals.    Follow Up Recommendations  Home health OT;Supervision/Assistance - 24 hour    Equipment Recommendations  None recommended by OT    Recommendations for Other Services       Precautions / Restrictions Precautions Precautions: Fall Precaution Comments: orthostatic hypotension Restrictions Weight Bearing Restrictions: No      Mobility Bed Mobility Overal bed mobility: Needs Assistance Bed Mobility: Supine to Sit;Sit to Supine     Supine to sit: Supervision Sit to supine: Supervision   General bed mobility comments: Supervision for safety due to orthostasis  Transfers Overall transfer level: Needs assistance Equipment used: Rolling walker (2 wheeled) Transfers: Sit to/from Stand Sit to Stand: Min assist         General transfer comment: Min assist for steadying balance.    Balance Overall balance assessment: Needs assistance Sitting-balance support: Feet supported;No upper extremity supported Sitting balance-Leahy  Scale: Good     Standing balance support: No upper extremity supported;During functional activity Standing balance-Leahy Scale: Fair Standing balance comment: able to stand at sink and complete grooming activities                            ADL Overall ADL's : Needs assistance/impaired     Grooming: Minimal assistance;Standing;Wash/dry face;Brushing hair Grooming Details (indicate cue type and reason): Min assist for standing balance Upper Body Bathing: Set up;Supervision/ safety;Sitting   Lower Body Bathing: Minimal assistance;Sit to/from stand   Upper Body Dressing : Set up;Supervision/safety;Sitting   Lower Body Dressing: Minimal assistance;Sit to/from stand Lower Body Dressing Details (indicate cue type and reason): Pt able to adjust sock sitting EOB. Min assist for balance in standing Toilet Transfer: Minimal assistance;Ambulation;Regular Toilet;RW Toilet Transfer Details (indicate cue type and reason): Simulated by sit to stand from EOB with functional mobility in room.         Functional mobility during ADLs: Minimal assistance;Rolling walker General ADL Comments: No c/o dizziness with positional changes. Pt standing at sink and requesting to sit down; reports she feels like she is about to "go out"; returned to bed and RN notified.      Vision     Perception     Praxis      Pertinent Vitals/Pain Pain Assessment: No/denies pain     Hand Dominance     Extremity/Trunk Assessment Upper Extremity Assessment Upper Extremity Assessment: Generalized weakness   Lower Extremity Assessment Lower Extremity Assessment: Defer to PT evaluation   Cervical / Trunk Assessment Cervical / Trunk Assessment: Kyphotic  Communication Communication Communication: No difficulties   Cognition Arousal/Alertness: Awake/alert Behavior During Therapy: WFL for tasks assessed/performed Overall Cognitive Status: Within Functional Limits for tasks assessed                      General Comments       Exercises       Shoulder Instructions      Home Living Family/patient expects to be discharged to:: Assisted living ([facility])                             Home Equipment: Gilford Rile - 2 wheels;Cane - single point;Shower seat;Grab bars - toilet;Grab bars - tub/shower          Prior Functioning/Environment Level of Independence: Independent with assistive device(s)        Comments: Uses SPC PRN for longer distances. Daughter reports some falls. Walks to [employer] for meals, cooks some. Independent for BADL.        OT Problem List: Decreased strength;Decreased activity tolerance;Impaired balance (sitting and/or standing);Decreased knowledge of use of DME or AE;Decreased knowledge of precautions   OT Treatment/Interventions: Self-care/ADL training;Energy conservation;DME and/or AE instruction;Therapeutic activities;Patient/family education;Balance training    OT Goals(Current goals can be found in the care plan section) Acute Rehab OT Goals Patient Stated Goal: to go home OT Goal Formulation: With patient Time For Goal Achievement: 11/03/16 Potential to Achieve Goals: Good ADL Goals Pt Will Perform Grooming: with supervision;standing Pt Will Perform Upper Body Bathing: sitting;with set-up Pt Will Perform Lower Body Bathing: with supervision;sit to/from stand Pt Will Transfer to Toilet: with supervision;ambulating;regular height toilet Pt Will Perform Toileting - Clothing Manipulation and hygiene: with supervision;sit to/from stand Pt Will Perform Tub/Shower Transfer: Shower transfer;with supervision;ambulating;shower seat;rolling walker  OT Frequency: Min 2X/week   Barriers to D/C:            Co-evaluation              End of Session Equipment Utilized During Treatment: Gait belt;Rolling walker Nurse Communication: Mobility status  Activity Tolerance: Treatment limited secondary to medical complications  (Comment) (feeling like she is going to pass out) Patient left: in bed;with call bell/phone within reach   Time: 1425-1442 OT Time Calculation (min): 17 min Charges:  OT General Charges $OT Visit: 1 Procedure OT Evaluation $OT Eval Moderate Complexity: 1 Procedure G-Codes: OT G-codes **NOT FOR INPATIENT CLASS** Functional Assessment Tool Used: Clinical judgement Functional Limitation: Self care Self Care Current Status [id number]): At least 1 percent but less than 20 percent impaired, limited or restricted Self Care Goal Status [id number]): At least 1 percent but less than 20 percent impaired, limited or restricted   Binnie Kand M.S., OTR/L Pager: [phone]  10/20/2016, 2:50 PM

## 2016-10-20 NOTE — Clinical Social Work Note (Addendum)
Patient stable for discharge back to [facility] today. Nursing director Marijean Bravo [phone]) contacted and advised of discharge. Talked with patient and her son Daiva Nakayama will transport her back to facility. Ms. Pachuca informed that she cannot leave until discharge paperwork completed. MD contacted regarding d/c summary.  Discharge summary transmitted to facility and call made to [facility] (5:41 pm) to advise them. Called [facility] and Network engineer informed that d/c summary completed and transmitted to facility and requested that patient can now discharge (requested this information be provided to patient's nurse).  Fatmata Legere Givens, MSW, LCSW Licensed Clinical Social Worker [place] [phone]

## 2016-10-20 NOTE — Progress Notes (Signed)
Physical Therapy Treatment Patient Details Name: Janet Reeves MRN: [id number] DOB: 1929/01/11 Today's Date: 10/20/2016    History of Present Illness Patient is a 82 y/o female with hx of HTN, HLD, DM, breast ca, lymphoma presents s/p syncope x2.     PT Comments    Pt was again orthostatic from sitting to standing, BP from 140/72 to 109/51. Did allow her to ambulate within room with seating available throughout and by the end of ambulation, BP was 153/71. She had on TED hose today. Her continued orthostasis and lack of symptoms is concerning for mobility. PT will continue to follow.   Follow Up Recommendations  Home health PT;Supervision for mobility/OOB     Equipment Recommendations  None recommended by PT    Recommendations for Other Services OT consult     Precautions / Restrictions Precautions Precautions: Fall Precaution Comments: orthostatic hypotension Restrictions Weight Bearing Restrictions: No    Mobility  Bed Mobility Overal bed mobility: Modified Independent Bed Mobility: Supine to Sit     Supine to sit: Modified independent (Device/Increase time);HOB elevated        Transfers Overall transfer level: Needs assistance Equipment used: None Transfers: Sit to/from Stand Sit to Stand: Min assist         General transfer comment: min A to steady, pt preferring to hold stable surface  Ambulation/Gait Ambulation/Gait assistance: Min assist Ambulation Distance (Feet): 30 Feet Assistive device: 1 person hand held assist Gait Pattern/deviations: Step-through pattern;Decreased stride length;Trunk flexed Gait velocity: decreased   General Gait Details: pt was orthostatic and asymptomatic with standing so ambulation limited to within room with seating nearby, BP had risen by the time she was finished ambulating. Pt reports that she ambulates with cane, used HHA today but she frequently reached for counter, would recommend trying RW for stability next time.     Stairs            Wheelchair Mobility    Modified Rankin (Stroke Patients Only)       Balance Overall balance assessment: Needs assistance Sitting-balance support: Feet supported Sitting balance-Leahy Scale: Good     Standing balance support: During functional activity Standing balance-Leahy Scale: Fair Standing balance comment: mildly unsteady, safer with UE support                    Cognition Arousal/Alertness: Awake/alert Behavior During Therapy: WFL for tasks assessed/performed Overall Cognitive Status: Within Functional Limits for tasks assessed                      Exercises      General Comments General comments (skin integrity, edema, etc.): BP supine 134/78, sitting 140/72, standing 109/ 51, after ambulation 153/71      Pertinent Vitals/Pain Pain Assessment: No/denies pain    Home Living                      Prior Function            PT Goals (current goals can now be found in the care plan section) Acute Rehab PT Goals Patient Stated Goal: to go home PT Goal Formulation: With patient Time For Goal Achievement: 11/02/16 Potential to Achieve Goals: Good Progress towards PT goals: Progressing toward goals    Frequency    Min 3X/week      PT Plan Current plan remains appropriate    Co-evaluation             End of  Session Equipment Utilized During Treatment: Gait belt Activity Tolerance: Treatment limited secondary to medical complications (Comment) (orthostatic hypotension) Patient left: in chair;with call bell/phone within reach     Time: 1150-1207 PT Time Calculation (min) (ACUTE ONLY): 17 min  Charges:  $Gait Training: 8-22 mins                    G Codes:  Functional Assessment Tool Used: clinical judgement Functional Limitation: Mobility: Walking and moving around Mobility: Walking and Moving Around Current Status [phone]): At least 1 percent but less than 20 percent impaired, limited or  restricted Mobility: Walking and Moving Around Goal Status [phone]): 0 percent impaired, limited or restricted  [place] 10/20/2016, 1:32 PM

## 2016-10-21 DIAGNOSIS — M79672 Pain in left foot: Secondary | ICD-10-CM | POA: Diagnosis not present

## 2016-10-21 DIAGNOSIS — M79675 Pain in left toe(s): Secondary | ICD-10-CM | POA: Diagnosis not present

## 2016-10-21 DIAGNOSIS — R55 Syncope and collapse: Secondary | ICD-10-CM | POA: Diagnosis not present

## 2016-10-21 DIAGNOSIS — Z79899 Other long term (current) drug therapy: Secondary | ICD-10-CM | POA: Diagnosis not present

## 2016-10-21 DIAGNOSIS — E039 Hypothyroidism, unspecified: Secondary | ICD-10-CM | POA: Diagnosis not present

## 2016-10-21 DIAGNOSIS — G8911 Acute pain due to trauma: Secondary | ICD-10-CM | POA: Diagnosis not present

## 2016-10-21 DIAGNOSIS — S01511A Laceration without foreign body of lip, initial encounter: Secondary | ICD-10-CM | POA: Diagnosis not present

## 2016-10-21 DIAGNOSIS — S91202A Unspecified open wound of left great toe with damage to nail, initial encounter: Secondary | ICD-10-CM | POA: Diagnosis not present

## 2016-10-23 LAB — URINE CULTURE: Culture: >=100000 — AB

## 2016-11-04 DIAGNOSIS — G8929 Other chronic pain: Secondary | ICD-10-CM | POA: Diagnosis not present

## 2016-11-23 DIAGNOSIS — R54 Age-related physical debility: Secondary | ICD-10-CM | POA: Diagnosis not present

## 2016-11-23 DIAGNOSIS — A084 Viral intestinal infection, unspecified: Secondary | ICD-10-CM | POA: Diagnosis not present

## 2016-11-23 DIAGNOSIS — R197 Diarrhea, unspecified: Secondary | ICD-10-CM | POA: Diagnosis not present

## 2016-12-01 DIAGNOSIS — E039 Hypothyroidism, unspecified: Secondary | ICD-10-CM | POA: Diagnosis not present

## 2016-12-01 DIAGNOSIS — D519 Vitamin B12 deficiency anemia, unspecified: Secondary | ICD-10-CM | POA: Diagnosis not present

## 2016-12-01 DIAGNOSIS — Z79899 Other long term (current) drug therapy: Secondary | ICD-10-CM | POA: Diagnosis not present

## 2016-12-01 DIAGNOSIS — E559 Vitamin D deficiency, unspecified: Secondary | ICD-10-CM | POA: Diagnosis not present

## 2016-12-01 DIAGNOSIS — B351 Tinea unguium: Secondary | ICD-10-CM | POA: Diagnosis not present

## 2016-12-08 DIAGNOSIS — R54 Age-related physical debility: Secondary | ICD-10-CM | POA: Diagnosis not present

## 2016-12-08 DIAGNOSIS — M545 Low back pain: Secondary | ICD-10-CM | POA: Diagnosis not present

## 2016-12-08 DIAGNOSIS — E039 Hypothyroidism, unspecified: Secondary | ICD-10-CM | POA: Diagnosis not present

## 2016-12-20 ENCOUNTER — Ambulatory Visit (INDEPENDENT_AMBULATORY_CARE_PROVIDER_SITE_OTHER): Payer: Medicare Other | Admitting: Podiatry

## 2016-12-20 DIAGNOSIS — L851 Acquired keratosis [keratoderma] palmaris et plantaris: Secondary | ICD-10-CM

## 2016-12-20 DIAGNOSIS — B351 Tinea unguium: Secondary | ICD-10-CM | POA: Diagnosis not present

## 2016-12-20 DIAGNOSIS — M79609 Pain in unspecified limb: Secondary | ICD-10-CM | POA: Diagnosis not present

## 2016-12-20 DIAGNOSIS — L84 Corns and callosities: Secondary | ICD-10-CM

## 2016-12-20 DIAGNOSIS — L608 Other nail disorders: Secondary | ICD-10-CM

## 2016-12-20 DIAGNOSIS — M79672 Pain in left foot: Secondary | ICD-10-CM | POA: Diagnosis not present

## 2016-12-20 DIAGNOSIS — M79671 Pain in right foot: Secondary | ICD-10-CM

## 2016-12-20 DIAGNOSIS — L603 Nail dystrophy: Secondary | ICD-10-CM

## 2016-12-20 NOTE — Progress Notes (Signed)
   SUBJECTIVE Patient  presents to office today complaining of elongated, thickened nails. Pain while ambulating in shoes. Patient is unable to trim their own nails.  Patient also complains of a traumatic nail avulsion to the left great toenail. This occurred on 11/19/2016. Patient went to the emergency department at the time of incident. Patient states that she fell.  OBJECTIVE General Patient is awake, alert, and oriented x 3 and in no acute distress. Derm nail bed noted to the left great toenail appears to be intact without significant trauma. Nail plate completely removed. Nail bed appears stable. Negative for drainage or open lesions. Skin is dry and supple bilateral. Negative open lesions or macerations. Remaining integument unremarkable. Nails are tender, long, thickened and dystrophic with subungual debris, consistent with onychomycosis, 1-5 bilateral. No signs of infection noted. Vasc  DP and PT pedal pulses palpable bilaterally. Temperature gradient within normal limits.  Neuro Epicritic and protective threshold sensation diminished bilaterally.  Musculoskeletal Exam No symptomatic pedal deformities noted bilateral. Muscular strength within normal limits.  ASSESSMENT 1. Onychodystrophic nails 1-5 bilateral with hyperkeratosis of nails.  2. Onychomycosis of nail due to dermatophyte bilateral 3. Pain in foot bilateral 4. Traumatic avulsion of left great toenail - with routine healing 5. Callus formation fourth digit left foot 6. Hammertoe deformity digits 2-5 bilateral  PLAN OF CARE 1. Patient evaluated today.  2. Instructed to maintain good pedal hygiene and foot care.  3. Mechanical debridement of nails 1-5 bilaterally performed using a nail nipper. Filed with dremel without incident.  4. Excisional debridement of painful callus lesion to the fourth digit left foot was performed using a chisel blade without incident or bleeding 5. Return to clinic in 3 mos.    Edrick Kins,  DPM [facility]  Dr. Edrick Kins, [place]                                        [place],  [address]                Office [phone]  Fax [phone]

## 2017-01-05 DIAGNOSIS — Z79899 Other long term (current) drug therapy: Secondary | ICD-10-CM | POA: Diagnosis not present

## 2017-01-05 DIAGNOSIS — G8929 Other chronic pain: Secondary | ICD-10-CM | POA: Diagnosis not present

## 2017-03-16 DIAGNOSIS — Z79899 Other long term (current) drug therapy: Secondary | ICD-10-CM | POA: Diagnosis not present

## 2017-03-16 DIAGNOSIS — E039 Hypothyroidism, unspecified: Secondary | ICD-10-CM | POA: Diagnosis not present

## 2017-03-23 DIAGNOSIS — E039 Hypothyroidism, unspecified: Secondary | ICD-10-CM | POA: Diagnosis not present

## 2017-03-23 DIAGNOSIS — K439 Ventral hernia without obstruction or gangrene: Secondary | ICD-10-CM | POA: Diagnosis not present

## 2017-03-23 DIAGNOSIS — G8929 Other chronic pain: Secondary | ICD-10-CM | POA: Diagnosis not present

## 2017-03-23 DIAGNOSIS — L84 Corns and callosities: Secondary | ICD-10-CM | POA: Diagnosis not present

## 2017-03-30 DIAGNOSIS — K439 Ventral hernia without obstruction or gangrene: Secondary | ICD-10-CM | POA: Diagnosis not present

## 2017-06-08 DIAGNOSIS — E039 Hypothyroidism, unspecified: Secondary | ICD-10-CM | POA: Diagnosis not present

## 2017-06-08 DIAGNOSIS — G8929 Other chronic pain: Secondary | ICD-10-CM | POA: Diagnosis not present

## 2017-06-15 DIAGNOSIS — G309 Alzheimer's disease, unspecified: Secondary | ICD-10-CM | POA: Diagnosis not present

## 2017-06-15 DIAGNOSIS — Z7982 Long term (current) use of aspirin: Secondary | ICD-10-CM | POA: Diagnosis not present

## 2017-06-15 DIAGNOSIS — Z9181 History of falling: Secondary | ICD-10-CM | POA: Diagnosis not present

## 2017-06-15 DIAGNOSIS — E039 Hypothyroidism, unspecified: Secondary | ICD-10-CM | POA: Diagnosis not present

## 2017-06-15 DIAGNOSIS — I10 Essential (primary) hypertension: Secondary | ICD-10-CM | POA: Diagnosis not present

## 2017-06-15 DIAGNOSIS — E559 Vitamin D deficiency, unspecified: Secondary | ICD-10-CM | POA: Diagnosis not present

## 2017-06-15 DIAGNOSIS — D538 Other specified nutritional anemias: Secondary | ICD-10-CM | POA: Diagnosis not present

## 2017-06-21 DIAGNOSIS — G309 Alzheimer's disease, unspecified: Secondary | ICD-10-CM | POA: Diagnosis not present

## 2017-06-21 DIAGNOSIS — Z9181 History of falling: Secondary | ICD-10-CM | POA: Diagnosis not present

## 2017-06-21 DIAGNOSIS — E559 Vitamin D deficiency, unspecified: Secondary | ICD-10-CM | POA: Diagnosis not present

## 2017-06-21 DIAGNOSIS — D538 Other specified nutritional anemias: Secondary | ICD-10-CM | POA: Diagnosis not present

## 2017-06-21 DIAGNOSIS — I10 Essential (primary) hypertension: Secondary | ICD-10-CM | POA: Diagnosis not present

## 2017-06-21 DIAGNOSIS — E039 Hypothyroidism, unspecified: Secondary | ICD-10-CM | POA: Diagnosis not present

## 2017-06-21 DIAGNOSIS — Z7982 Long term (current) use of aspirin: Secondary | ICD-10-CM | POA: Diagnosis not present

## 2017-06-27 DIAGNOSIS — I10 Essential (primary) hypertension: Secondary | ICD-10-CM | POA: Diagnosis not present

## 2017-06-27 DIAGNOSIS — E559 Vitamin D deficiency, unspecified: Secondary | ICD-10-CM | POA: Diagnosis not present

## 2017-06-27 DIAGNOSIS — D538 Other specified nutritional anemias: Secondary | ICD-10-CM | POA: Diagnosis not present

## 2017-06-27 DIAGNOSIS — E039 Hypothyroidism, unspecified: Secondary | ICD-10-CM | POA: Diagnosis not present

## 2017-06-27 DIAGNOSIS — Z9181 History of falling: Secondary | ICD-10-CM | POA: Diagnosis not present

## 2017-06-27 DIAGNOSIS — G309 Alzheimer's disease, unspecified: Secondary | ICD-10-CM | POA: Diagnosis not present

## 2017-06-27 DIAGNOSIS — Z7982 Long term (current) use of aspirin: Secondary | ICD-10-CM | POA: Diagnosis not present

## 2017-06-28 DIAGNOSIS — E039 Hypothyroidism, unspecified: Secondary | ICD-10-CM | POA: Diagnosis not present

## 2017-06-28 DIAGNOSIS — E559 Vitamin D deficiency, unspecified: Secondary | ICD-10-CM | POA: Diagnosis not present

## 2017-06-28 DIAGNOSIS — G309 Alzheimer's disease, unspecified: Secondary | ICD-10-CM | POA: Diagnosis not present

## 2017-06-28 DIAGNOSIS — I10 Essential (primary) hypertension: Secondary | ICD-10-CM | POA: Diagnosis not present

## 2017-06-28 DIAGNOSIS — Z9181 History of falling: Secondary | ICD-10-CM | POA: Diagnosis not present

## 2017-06-28 DIAGNOSIS — Z7982 Long term (current) use of aspirin: Secondary | ICD-10-CM | POA: Diagnosis not present

## 2017-06-28 DIAGNOSIS — D538 Other specified nutritional anemias: Secondary | ICD-10-CM | POA: Diagnosis not present

## 2017-07-04 DIAGNOSIS — G309 Alzheimer's disease, unspecified: Secondary | ICD-10-CM | POA: Diagnosis not present

## 2017-07-04 DIAGNOSIS — D538 Other specified nutritional anemias: Secondary | ICD-10-CM | POA: Diagnosis not present

## 2017-07-04 DIAGNOSIS — I10 Essential (primary) hypertension: Secondary | ICD-10-CM | POA: Diagnosis not present

## 2017-07-04 DIAGNOSIS — E559 Vitamin D deficiency, unspecified: Secondary | ICD-10-CM | POA: Diagnosis not present

## 2017-07-04 DIAGNOSIS — E039 Hypothyroidism, unspecified: Secondary | ICD-10-CM | POA: Diagnosis not present

## 2017-07-04 DIAGNOSIS — Z9181 History of falling: Secondary | ICD-10-CM | POA: Diagnosis not present

## 2017-07-04 DIAGNOSIS — Z7982 Long term (current) use of aspirin: Secondary | ICD-10-CM | POA: Diagnosis not present

## 2017-07-06 DIAGNOSIS — I10 Essential (primary) hypertension: Secondary | ICD-10-CM | POA: Diagnosis not present

## 2017-07-06 DIAGNOSIS — K219 Gastro-esophageal reflux disease without esophagitis: Secondary | ICD-10-CM | POA: Diagnosis not present

## 2017-07-06 DIAGNOSIS — M2550 Pain in unspecified joint: Secondary | ICD-10-CM | POA: Diagnosis not present

## 2017-07-06 DIAGNOSIS — E785 Hyperlipidemia, unspecified: Secondary | ICD-10-CM | POA: Diagnosis not present

## 2017-07-12 DIAGNOSIS — Z7982 Long term (current) use of aspirin: Secondary | ICD-10-CM | POA: Diagnosis not present

## 2017-07-12 DIAGNOSIS — G309 Alzheimer's disease, unspecified: Secondary | ICD-10-CM | POA: Diagnosis not present

## 2017-07-12 DIAGNOSIS — I10 Essential (primary) hypertension: Secondary | ICD-10-CM | POA: Diagnosis not present

## 2017-07-12 DIAGNOSIS — D538 Other specified nutritional anemias: Secondary | ICD-10-CM | POA: Diagnosis not present

## 2017-07-12 DIAGNOSIS — E559 Vitamin D deficiency, unspecified: Secondary | ICD-10-CM | POA: Diagnosis not present

## 2017-07-12 DIAGNOSIS — E039 Hypothyroidism, unspecified: Secondary | ICD-10-CM | POA: Diagnosis not present

## 2017-07-12 DIAGNOSIS — Z9181 History of falling: Secondary | ICD-10-CM | POA: Diagnosis not present

## 2017-07-13 DIAGNOSIS — E039 Hypothyroidism, unspecified: Secondary | ICD-10-CM | POA: Diagnosis not present

## 2017-07-13 DIAGNOSIS — E559 Vitamin D deficiency, unspecified: Secondary | ICD-10-CM | POA: Diagnosis not present

## 2017-07-13 DIAGNOSIS — Z7982 Long term (current) use of aspirin: Secondary | ICD-10-CM | POA: Diagnosis not present

## 2017-07-13 DIAGNOSIS — G309 Alzheimer's disease, unspecified: Secondary | ICD-10-CM | POA: Diagnosis not present

## 2017-07-13 DIAGNOSIS — D538 Other specified nutritional anemias: Secondary | ICD-10-CM | POA: Diagnosis not present

## 2017-07-13 DIAGNOSIS — I10 Essential (primary) hypertension: Secondary | ICD-10-CM | POA: Diagnosis not present

## 2017-07-13 DIAGNOSIS — Z9181 History of falling: Secondary | ICD-10-CM | POA: Diagnosis not present

## 2017-08-15 DIAGNOSIS — I7389 Other specified peripheral vascular diseases: Secondary | ICD-10-CM | POA: Diagnosis not present

## 2017-08-15 DIAGNOSIS — M2040 Other hammer toe(s) (acquired), unspecified foot: Secondary | ICD-10-CM | POA: Diagnosis not present

## 2017-08-15 DIAGNOSIS — B351 Tinea unguium: Secondary | ICD-10-CM | POA: Diagnosis not present

## 2017-08-29 DIAGNOSIS — D519 Vitamin B12 deficiency anemia, unspecified: Secondary | ICD-10-CM | POA: Diagnosis not present

## 2017-08-29 DIAGNOSIS — Z79899 Other long term (current) drug therapy: Secondary | ICD-10-CM | POA: Diagnosis not present

## 2017-09-21 DIAGNOSIS — Z23 Encounter for immunization: Secondary | ICD-10-CM | POA: Diagnosis not present

## 2017-10-26 DIAGNOSIS — B351 Tinea unguium: Secondary | ICD-10-CM | POA: Diagnosis not present

## 2017-10-26 DIAGNOSIS — I739 Peripheral vascular disease, unspecified: Secondary | ICD-10-CM | POA: Diagnosis not present

## 2017-11-15 DIAGNOSIS — Z79899 Other long term (current) drug therapy: Secondary | ICD-10-CM | POA: Diagnosis not present

## 2017-11-15 DIAGNOSIS — Z23 Encounter for immunization: Secondary | ICD-10-CM | POA: Diagnosis not present

## 2017-11-15 DIAGNOSIS — R6881 Early satiety: Secondary | ICD-10-CM | POA: Diagnosis not present

## 2017-11-15 DIAGNOSIS — E039 Hypothyroidism, unspecified: Secondary | ICD-10-CM | POA: Diagnosis not present

## 2017-11-15 DIAGNOSIS — E559 Vitamin D deficiency, unspecified: Secondary | ICD-10-CM | POA: Diagnosis not present

## 2017-11-22 DIAGNOSIS — G308 Other Alzheimer's disease: Secondary | ICD-10-CM | POA: Diagnosis not present

## 2017-11-22 DIAGNOSIS — M545 Low back pain: Secondary | ICD-10-CM | POA: Diagnosis not present

## 2017-11-22 DIAGNOSIS — E038 Other specified hypothyroidism: Secondary | ICD-10-CM | POA: Diagnosis not present

## 2017-12-13 DIAGNOSIS — E038 Other specified hypothyroidism: Secondary | ICD-10-CM | POA: Diagnosis not present

## 2017-12-13 DIAGNOSIS — N6320 Unspecified lump in the left breast, unspecified quadrant: Secondary | ICD-10-CM | POA: Diagnosis not present

## 2017-12-13 DIAGNOSIS — G308 Other Alzheimer's disease: Secondary | ICD-10-CM | POA: Diagnosis not present

## 2017-12-19 DIAGNOSIS — M542 Cervicalgia: Secondary | ICD-10-CM | POA: Diagnosis not present

## 2017-12-19 DIAGNOSIS — M545 Low back pain: Secondary | ICD-10-CM | POA: Diagnosis not present

## 2017-12-19 DIAGNOSIS — M546 Pain in thoracic spine: Secondary | ICD-10-CM | POA: Diagnosis not present

## 2017-12-20 DIAGNOSIS — M545 Low back pain: Secondary | ICD-10-CM | POA: Diagnosis not present

## 2017-12-20 DIAGNOSIS — R197 Diarrhea, unspecified: Secondary | ICD-10-CM | POA: Diagnosis not present

## 2017-12-20 DIAGNOSIS — G308 Other Alzheimer's disease: Secondary | ICD-10-CM | POA: Diagnosis not present

## 2017-12-23 DIAGNOSIS — N6320 Unspecified lump in the left breast, unspecified quadrant: Secondary | ICD-10-CM | POA: Diagnosis not present

## 2017-12-27 DIAGNOSIS — M545 Low back pain: Secondary | ICD-10-CM | POA: Diagnosis not present

## 2017-12-27 DIAGNOSIS — R300 Dysuria: Secondary | ICD-10-CM | POA: Diagnosis not present

## 2017-12-27 DIAGNOSIS — R197 Diarrhea, unspecified: Secondary | ICD-10-CM | POA: Diagnosis not present

## 2017-12-27 DIAGNOSIS — K5909 Other constipation: Secondary | ICD-10-CM | POA: Diagnosis not present

## 2017-12-28 ENCOUNTER — Other Ambulatory Visit: Payer: Self-pay | Admitting: Nurse Practitioner

## 2017-12-28 DIAGNOSIS — N6320 Unspecified lump in the left breast, unspecified quadrant: Secondary | ICD-10-CM

## 2017-12-28 DIAGNOSIS — I739 Peripheral vascular disease, unspecified: Secondary | ICD-10-CM | POA: Diagnosis not present

## 2017-12-28 DIAGNOSIS — B351 Tinea unguium: Secondary | ICD-10-CM | POA: Diagnosis not present

## 2018-01-02 DIAGNOSIS — Z79899 Other long term (current) drug therapy: Secondary | ICD-10-CM | POA: Diagnosis not present

## 2018-01-02 DIAGNOSIS — Z23 Encounter for immunization: Secondary | ICD-10-CM | POA: Diagnosis not present

## 2018-01-02 DIAGNOSIS — E039 Hypothyroidism, unspecified: Secondary | ICD-10-CM | POA: Diagnosis not present

## 2018-01-02 DIAGNOSIS — R6881 Early satiety: Secondary | ICD-10-CM | POA: Diagnosis not present

## 2018-01-03 DIAGNOSIS — C50919 Malignant neoplasm of unspecified site of unspecified female breast: Secondary | ICD-10-CM | POA: Diagnosis not present

## 2018-01-03 DIAGNOSIS — M545 Low back pain: Secondary | ICD-10-CM | POA: Diagnosis not present

## 2018-01-03 DIAGNOSIS — E038 Other specified hypothyroidism: Secondary | ICD-10-CM | POA: Diagnosis not present

## 2018-01-09 DIAGNOSIS — R269 Unspecified abnormalities of gait and mobility: Secondary | ICD-10-CM | POA: Diagnosis not present

## 2018-01-09 DIAGNOSIS — G309 Alzheimer's disease, unspecified: Secondary | ICD-10-CM | POA: Diagnosis not present

## 2018-01-09 DIAGNOSIS — G308 Other Alzheimer's disease: Secondary | ICD-10-CM | POA: Diagnosis not present

## 2018-02-03 DIAGNOSIS — R260 Ataxic gait: Secondary | ICD-10-CM | POA: Diagnosis not present

## 2018-02-15 DIAGNOSIS — R531 Weakness: Secondary | ICD-10-CM | POA: Diagnosis not present

## 2018-02-26 IMAGING — CT CT HEAD W/O CM
4 series · 15 of 47 positions shown, 17 images · non-contrast
Comparison: 05/28/2015

CLINICAL DATA: Loss of consciousness

EXAM:
CT HEAD WITHOUT CONTRAST
TECHNIQUE: Contiguous axial images were obtained from the base of the skull
through the vertex without intravenous contrast.

[Series 2: head without · axial · non-contrast · 0.46mm/px · z∈[-74,+46]mm · 7 of 32 slices shown, 9 images]
[im 4/32  brain]
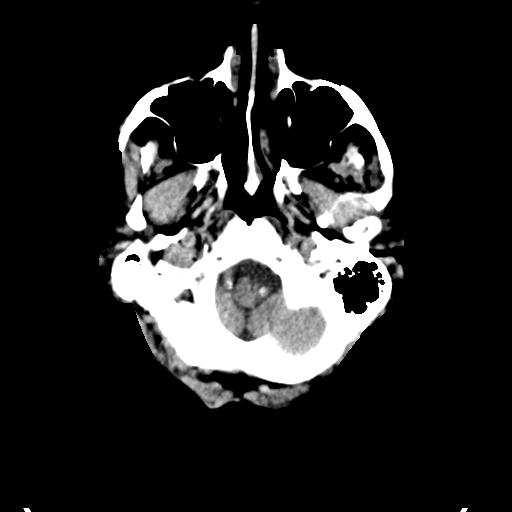
[im 4/32  bone]
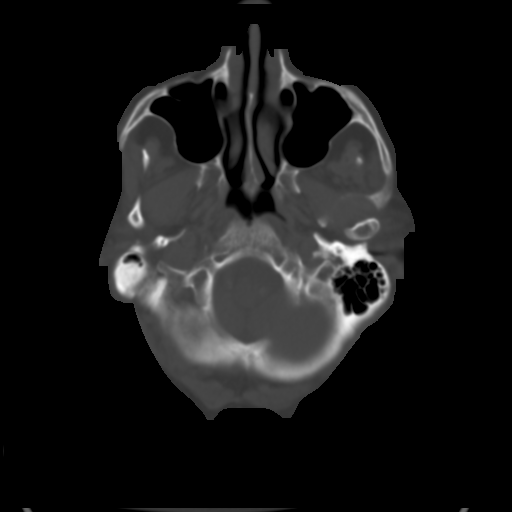
[im 8/32  brain]
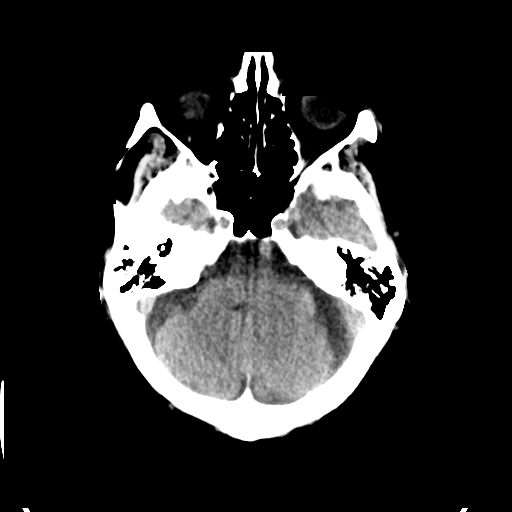
[im 12/32  brain]
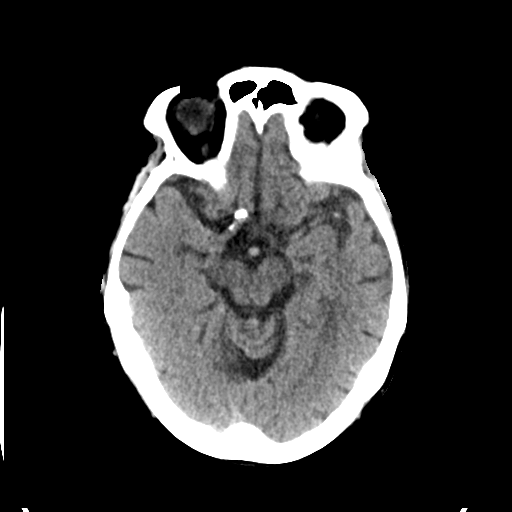
[im 16/32  brain]
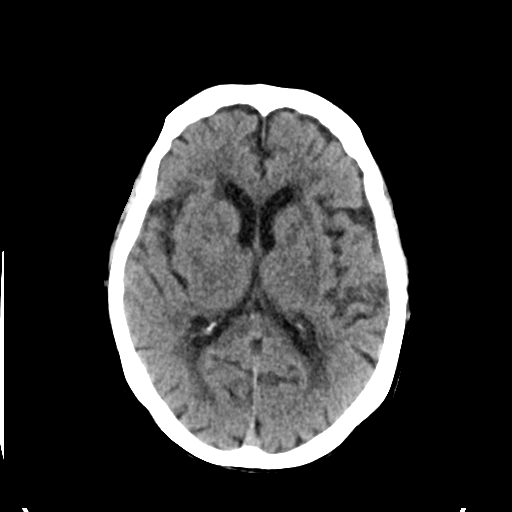
[im 20/32  brain]
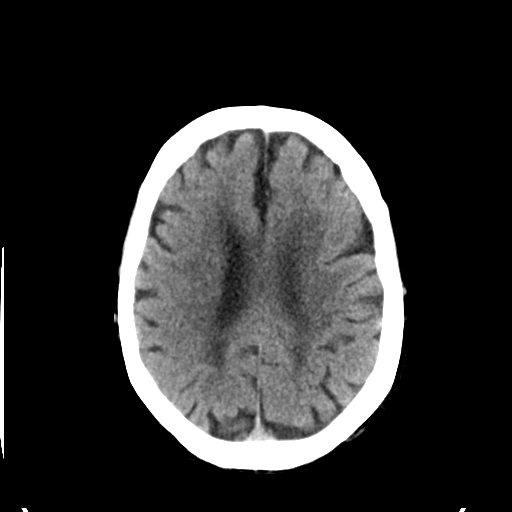
[im 20/32  bone]
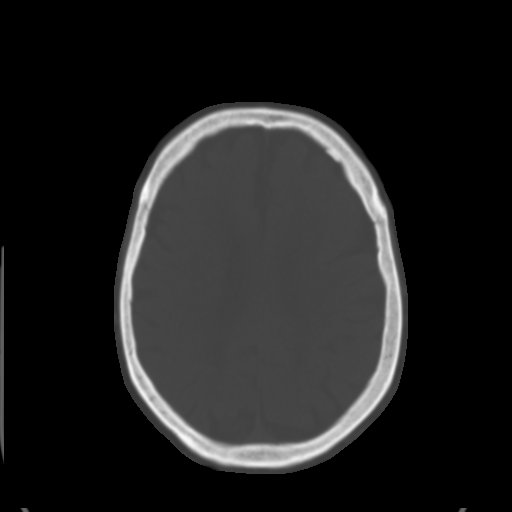
[im 24/32  brain]
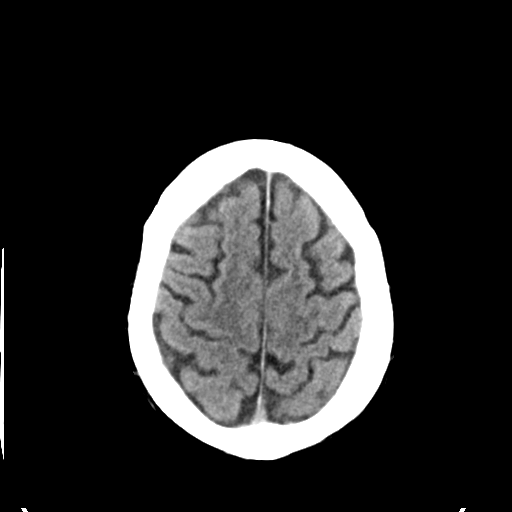
[im 28/32  brain]
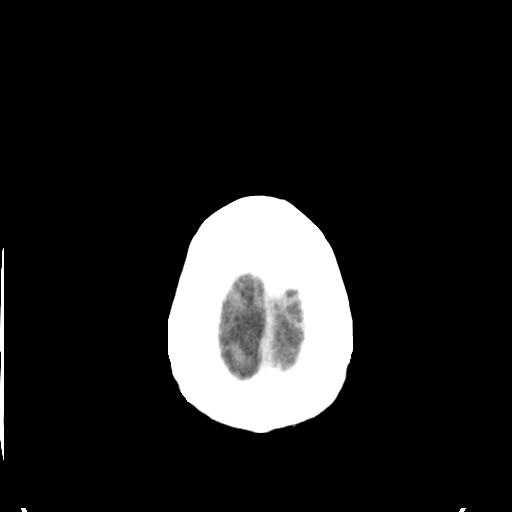

[Series 3: head bone · axial · 0.46mm/px · z∈[-75,-59]mm · 2 of 80 slices shown]
[im 8/80  bone]
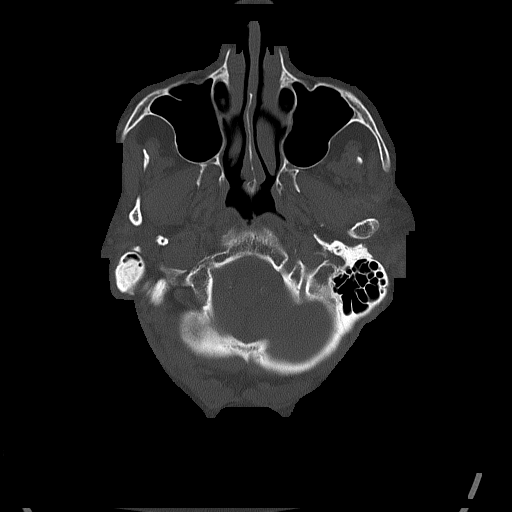
[im 16/80  bone]
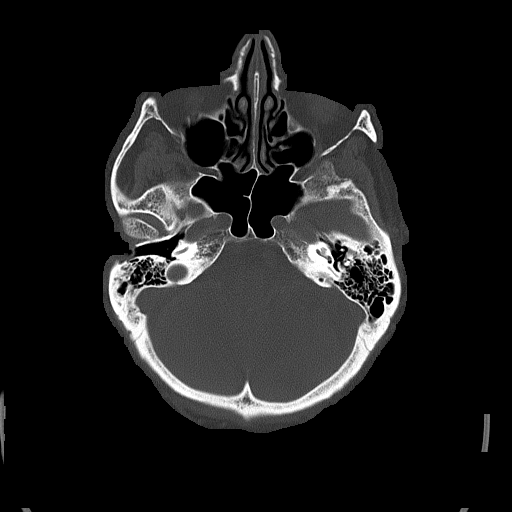

[Series 4: head without cor · coronal · non-contrast · 0.33mm/px · 3 of 71 slices shown]
[im 24/71  brain]
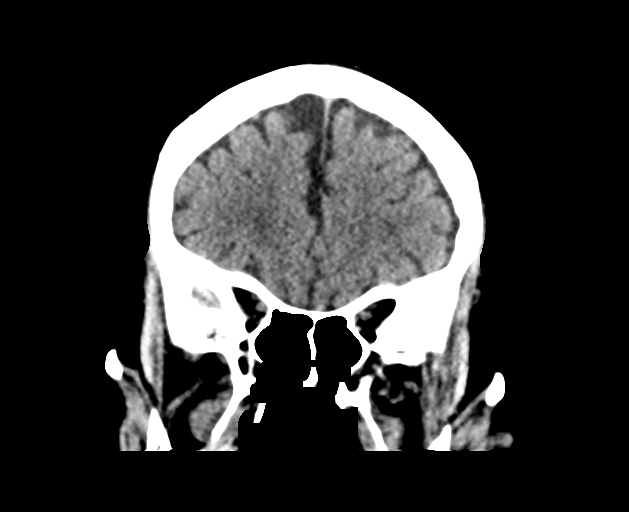
[im 32/71  brain]
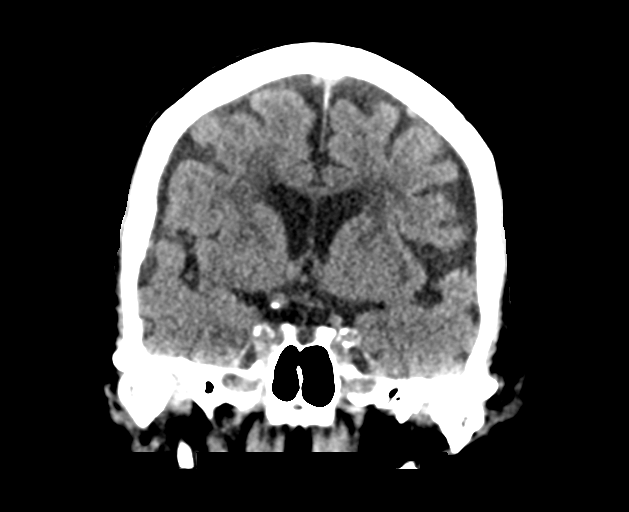
[im 39/71  brain]
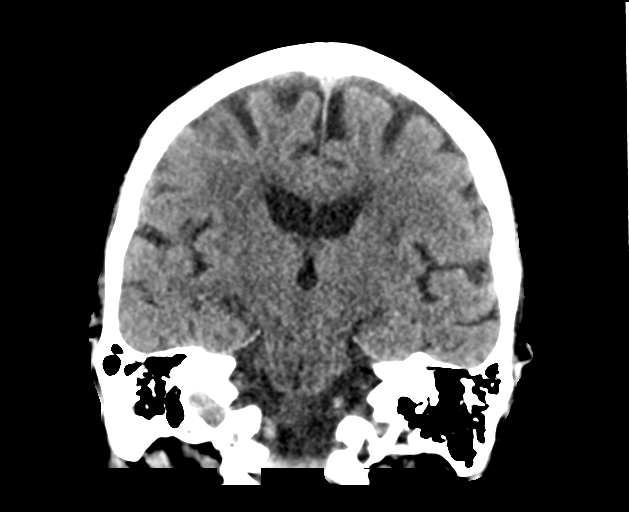

[Series 5: head without sag · sagittal · non-contrast · 0.32mm/px · 3 of 67 slices shown]
[im 23/67  brain]
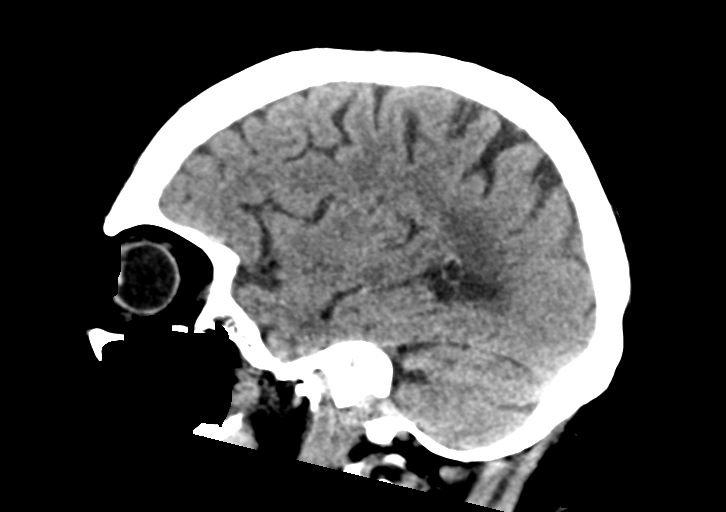
[im 34/67  brain]
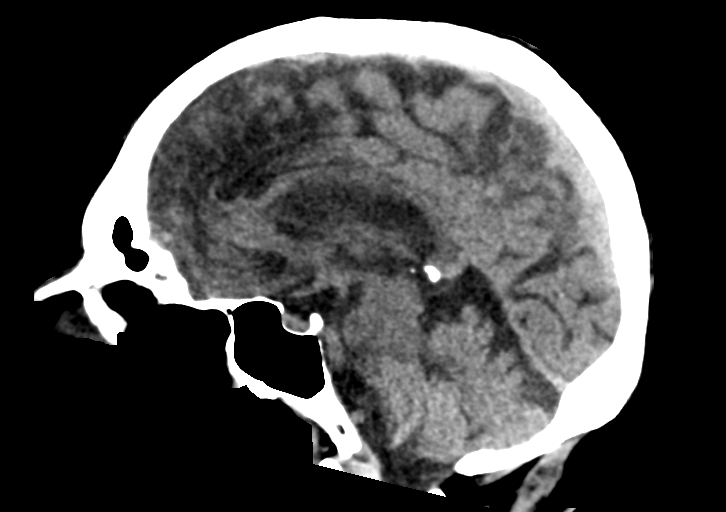
[im 45/67  brain]
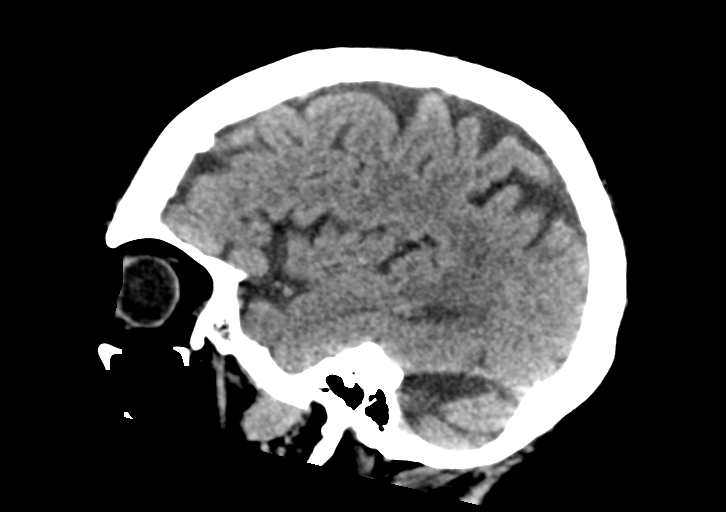

[15 of 47 positions shown; findings below may reference images not displayed]

FINDINGS: BRAIN: There is sulcal and ventricular prominence consistent with
superficial and central atrophy. No intraparenchymal hemorrhage,
mass effect nor midline shift. There are patchy periventricular and
subcortical white matter hypodensities consistent with chronic small
vessel ischemic disease. No acute large vascular territory infarcts.
No abnormal extra-axial fluid collections. Basal cisterns are
patent.

VASCULAR: Moderate calcific atherosclerosis of the carotid siphons.

SKULL: No skull fracture. No significant scalp soft tissue swelling.

SINUSES/ORBITS: The mastoid air-cells are clear. The included
paranasal sinuses are well-aerated.The included ocular globes and
orbital contents are non-suspicious. Bilateral lens replacements
surgeries.

OTHER: None.
IMPRESSION: Atrophy with chronic small vessel ischemic disease of
periventricular white matter. No acute intracranial abnormality.

## 2018-03-01 DIAGNOSIS — I739 Peripheral vascular disease, unspecified: Secondary | ICD-10-CM | POA: Diagnosis not present

## 2018-03-01 DIAGNOSIS — B351 Tinea unguium: Secondary | ICD-10-CM | POA: Diagnosis not present

## 2018-03-13 ENCOUNTER — Non-Acute Institutional Stay: Payer: Self-pay | Admitting: Hospice and Palliative Medicine

## 2018-03-13 DIAGNOSIS — Z515 Encounter for palliative care: Secondary | ICD-10-CM

## 2018-03-13 NOTE — Progress Notes (Signed)
PALLIATIVE CARE CONSULT VISIT   PATIENT NAME: Janet Reeves DOB: Feb 13, 1929 MRN: [id number]  PRIMARY CARE PROVIDER: Jacklyn Shell, FNP  REFERRING PROVIDER: Jacklyn Shell  RESPONSIBLE PARTY:   Daughter   RECOMMENDATIONS and PLAN:  1. Weakness: secondary to age and chronic conditions. She resides in the AL setting. PT/OT have already completed skilled care. No recommendations to make at this time. Advise that she use proper equipment to avoid falls. Encourage resident to go to meals. Encourage protein intake. 2. Pain: denies any pain today. 3. Leg edema: keeps her legs elevated. Controlled.  4. ACP: reaching out to daughter to discuss MOST form/DNR status.  I spent 20 minutes providing this consultation,  from 11:45 am to 12:05pm. More than 50% of the time in this consultation was spent coordinating communication.   HISTORY OF PRESENT ILLNESS:  Janet Reeves is a 84 y.o.  female with dementia and a history of breast ca, as well as others. Palliative Care was asked to help address symptom management and goals of care.   CODE STATUS: DNR  PPS: 50% HOSPICE ELIGIBILITY/DIAGNOSIS: TBD  PAST MEDICAL HISTORY:  Past Medical History:  Diagnosis Date  . Breast cancer ([place])    Mastectomy and chemotherapy  . Diabetes mellitus ([place])    Diet controlled  . GERD (gastroesophageal reflux disease)   . Hyperlipidemia   . Hypertension   . Hypothyroid   . Lymphoma ([place])    Chemotherapy    SOCIAL HX:  Social History   Tobacco Use  . Smoking status: Never Smoker  Substance Use Topics  . Alcohol use: No    ALLERGIES:  Allergies  Allergen Reactions  . Codeine Nausea And Vomiting  . Macrobid [Nitrofurantoin Macrocrystal] Other (See Comments)    Dizziness.     PERTINENT MEDICATIONS:  Outpatient Encounter Medications as of 03/13/2018  Medication Sig  . acetaminophen (TYLENOL) 500 MG tablet Take 1,000 mg by mouth 3 (three) times daily.  Marland Kitchen aspirin 81 MG tablet Take 81 mg by mouth  daily.  . Cholecalciferol (VITAMIN D3) 5000 UNITS CAPS Take 5,000 Units by mouth daily.  . citalopram (CELEXA) 20 MG tablet Take 1 tablet daily for depression (Patient taking differently: Take 40 mg by mouth daily. Take 1 tablet daily for depression)  . donepezil (ARICEPT) 10 MG tablet Take 10 mg by mouth at bedtime.  Marland Kitchen levothyroxine (SYNTHROID) 150 MCG tablet Take 1 tablet (150 mcg total) by mouth daily. (Patient taking differently: Take 75 mcg by mouth 2 (two) times daily. )  . levothyroxine (SYNTHROID, LEVOTHROID) 50 MCG tablet   . levothyroxine (SYNTHROID, LEVOTHROID) 75 MCG tablet   . mirtazapine (REMERON) 15 MG tablet Take 15 mg by mouth at bedtime.  . Omega-3 Fatty Acids (FISH OIL) 1000 MG CAPS Take 1,000 mg by mouth daily.   Marland Kitchen sulfamethoxazole-trimethoprim (BACTRIM DS,SEPTRA DS) 800-160 MG tablet Take 1 tablet by mouth every 12 (twelve) hours. (Patient not taking: Reported on 12/20/2016)  . traMADol (ULTRAM) 50 MG tablet Take by mouth every 6 (six) hours as needed.  . vitamin B-12 (CYANOCOBALAMIN) 1000 MCG tablet Take 1,000 mcg by mouth daily.   No facility-administered encounter medications on file as of 03/13/2018.     PHYSICAL EXAM:   General: elderly, Caucasian female sitting in recliner in NAD Cardiovascular: RRR Pulmonary: diminished in bases; breathing easily; no cough or wheeze Abdomen: soft, active BS Extremities: trace edema bilat Skin: thin and fragile Neurological: alert; answers short answer questions. Unaware of the time of  day. Not certain if she had eaten lunch or not  Nathanial Rancher, NP

## 2018-03-14 DIAGNOSIS — C50919 Malignant neoplasm of unspecified site of unspecified female breast: Secondary | ICD-10-CM | POA: Diagnosis not present

## 2018-03-14 DIAGNOSIS — I9589 Other hypotension: Secondary | ICD-10-CM | POA: Diagnosis not present

## 2018-03-14 DIAGNOSIS — E038 Other specified hypothyroidism: Secondary | ICD-10-CM | POA: Diagnosis not present

## 2018-03-14 DIAGNOSIS — G308 Other Alzheimer's disease: Secondary | ICD-10-CM | POA: Diagnosis not present

## 2018-03-24 ENCOUNTER — Non-Acute Institutional Stay: Payer: Self-pay | Admitting: Hospice and Palliative Medicine

## 2018-03-24 DIAGNOSIS — Z515 Encounter for palliative care: Secondary | ICD-10-CM

## 2018-03-24 NOTE — Progress Notes (Signed)
PALLIATIVE CARE CONSULT VISIT   PATIENT NAME: Janet Reeves DOB: 18-Oct-1929 MRN: [id number]  PRIMARY CARE PROVIDER: Jacklyn Shell, FNP  REFERRING PROVIDER: Jacklyn Shell, [place], [place] [address]  RESPONSIBLE PARTY:   Daughter   RECOMMENDATIONS and PLAN:  1.Weakness: secondary to advanced age, memory loss, thought to be reoccurring breast cancer. Mass found on ultrasound, no other tests, scans or oncologist follow up so far. Previous mastectomy. She resides in the AL setting. She has worked with therapy. She sleeps a lot. She forgets to use any assistive device. She does not have concept of time and is forgetting how to answer/use her phone. She has lost weight of 6 sizes in the past three years. She eats when she goes to the dining room. Prompting must occur to get her there. She is high fall risk. She does not remember conversation. She is still able to speak in sentences, often frustrated from having to word search.  2. Pain: currently pain has improved from previous compression fracture in her spine/arthritis. She is on scheduled Tylenol and she denies having any pain. She has prn ultram if needed. 3. Anticipatory grieving: patient's daughter suffers from the stress of the responsibility and the unknown time line of her mother's diseases. Much time provided today in outlining goals of care >150 minutes. Offered SW support. 4. ACP: DNR form completed. MOST form completed. No ventilation, ICU, feeding tube desired. IV abx desired. IV fluids to be determined at the time. Patient and daughter not with Hospice philosophy at this time, but will with further decline. Mixed feelings about getting a cancer diagnosis/mamogram. Patient agrees to appointments and then refuses to go.   I spent 150 minutes providing this consultation,  from 1:10pm to 3:40pm. More than 50% of the time in this consultation was spent coordinating communication.   HISTORY OF PRESENT ILLNESS:  Janet Reeves is a 84 y.o.  female with multiple medical problems including Dementia, HTN and likely reoccurring breast cancer.   Palliative Care was asked to help address goals of care.   CODE STATUS: DNR  PPS: weak 40% HOSPICE ELIGIBILITY/DIAGNOSIS: TBD  PAST MEDICAL HISTORY:  Past Medical History:  Diagnosis Date  . Breast cancer ([place])    Mastectomy and chemotherapy  . Diabetes mellitus ([place])    Diet controlled  . GERD (gastroesophageal reflux disease)   . Hyperlipidemia   . Hypertension   . Hypothyroid   . Lymphoma ([place])    Chemotherapy    SOCIAL HX:  Social History   Tobacco Use  . Smoking status: Never Smoker  Substance Use Topics  . Alcohol use: No    ALLERGIES:  Allergies  Allergen Reactions  . Codeine Nausea And Vomiting  . Macrobid [Nitrofurantoin Macrocrystal] Other (See Comments)    Dizziness.     PERTINENT MEDICATIONS:  Outpatient Encounter Medications as of 03/24/2018  Medication Sig  . acetaminophen (TYLENOL) 500 MG tablet Take 1,000 mg by mouth 3 (three) times daily.  Marland Kitchen aspirin 81 MG tablet Take 81 mg by mouth daily.  . Cholecalciferol (VITAMIN D3) 5000 UNITS CAPS Take 5,000 Units by mouth daily.  . citalopram (CELEXA) 20 MG tablet Take 1 tablet daily for depression (Patient taking differently: Take 40 mg by mouth daily. Take 1 tablet daily for depression)  . donepezil (ARICEPT) 10 MG tablet Take 10 mg by mouth at bedtime.  Marland Kitchen levothyroxine (SYNTHROID) 150 MCG tablet Take 1 tablet (150 mcg total) by mouth daily. (Patient  taking differently: Take 75 mcg by mouth 2 (two) times daily. )  . levothyroxine (SYNTHROID, LEVOTHROID) 50 MCG tablet   . levothyroxine (SYNTHROID, LEVOTHROID) 75 MCG tablet   . mirtazapine (REMERON) 15 MG tablet Take 15 mg by mouth at bedtime.  . Omega-3 Fatty Acids (FISH OIL) 1000 MG CAPS Take 1,000 mg by mouth daily.   Marland Kitchen sulfamethoxazole-trimethoprim (BACTRIM DS,SEPTRA DS) 800-160 MG tablet Take 1 tablet by mouth every 12 (twelve) hours.  (Patient not taking: Reported on 12/20/2016)  . traMADol (ULTRAM) 50 MG tablet Take by mouth every 6 (six) hours as needed.  . vitamin B-12 (CYANOCOBALAMIN) 1000 MCG tablet Take 1,000 mcg by mouth daily.   No facility-administered encounter medications on file as of 03/24/2018.     PHYSICAL EXAM:   General: +generalized weakness: sleeping, elderly, frail    Nathanial Rancher, NP

## 2018-03-28 DIAGNOSIS — D519 Vitamin B12 deficiency anemia, unspecified: Secondary | ICD-10-CM | POA: Diagnosis not present

## 2018-03-28 DIAGNOSIS — Z79899 Other long term (current) drug therapy: Secondary | ICD-10-CM | POA: Diagnosis not present

## 2018-04-12 DIAGNOSIS — E038 Other specified hypothyroidism: Secondary | ICD-10-CM | POA: Diagnosis not present

## 2018-04-12 DIAGNOSIS — I9589 Other hypotension: Secondary | ICD-10-CM | POA: Diagnosis not present

## 2018-04-12 DIAGNOSIS — C50919 Malignant neoplasm of unspecified site of unspecified female breast: Secondary | ICD-10-CM | POA: Diagnosis not present

## 2018-04-12 DIAGNOSIS — G308 Other Alzheimer's disease: Secondary | ICD-10-CM | POA: Diagnosis not present

## 2018-06-19 ENCOUNTER — Encounter: Payer: Self-pay | Admitting: Nurse Practitioner

## 2018-06-19 ENCOUNTER — Non-Acute Institutional Stay: Payer: Medicare Other | Admitting: Nurse Practitioner

## 2018-06-19 DIAGNOSIS — R269 Unspecified abnormalities of gait and mobility: Secondary | ICD-10-CM

## 2018-06-19 DIAGNOSIS — F0390 Unspecified dementia without behavioral disturbance: Secondary | ICD-10-CM

## 2018-06-19 DIAGNOSIS — Z515 Encounter for palliative care: Secondary | ICD-10-CM

## 2018-06-19 NOTE — Progress Notes (Signed)
    PALLIATIVE CARE CONSULT VISIT   PATIENT NAME: Janet Reeves 89 DOB: [birth year] MRN: [id number]  PRIMARY CARE PROVIDER:   Jacklyn Shell, FNP  REFERRING PROVIDER:  Jacklyn Reeves, [place], [place] [address]  RESPONSIBLE PARTY:  Janet Reeves (daughter) [phone] (HM) [phone] (wk) [phone] (mobile)      ASSESSMENT/RECOMMENDATIONS   Gait disturbance Memory loss Recurrent breast ca -patient ambulates with cane -no behavioral issues -weight stable -denies pain  ACP -DNR/MOST    I spent 15 minutes providing this consultation,  from 14:00 to 14:15. More than 50% of the time in this consultation was spent coordinating communication.   HISTORY OF PRESENT ILLNESS:  Janet Reeves is a 84 y.o. year old female with multiple medical problems including, advanced age,memory loss,gait disturbance , recurrent breast cancer.Palliative Care was asked to assist with symptom management and ongoing patient and family support.  CODE STATUS: DNR/MOST  PPS: 30% HOSPICE ELIGIBILITY/DIAGNOSIS: TBD  PAST MEDICAL HISTORY:  Past Medical History:  Diagnosis Date  . Breast cancer ([place])    Mastectomy and chemotherapy  . Diabetes mellitus ([place])    Diet controlled  . GERD (gastroesophageal reflux disease)   . Hyperlipidemia   . Hypertension   . Hypothyroid   . Lymphoma ([place])    Chemotherapy    SOCIAL HX:  Social History   Tobacco Use  . Smoking status: Never Smoker  Substance Use Topics  . Alcohol use: No    ALLERGIES:  Allergies  Allergen Reactions  . Codeine Nausea And Vomiting  . Macrobid [Nitrofurantoin Macrocrystal] Other (See Comments)    Dizziness.     PERTINENT MEDICATIONS:  Outpatient Encounter Medications as of 06/19/2018  Medication Sig  . acetaminophen (TYLENOL) 500 MG tablet Take 1,000 mg by mouth 3 (three) times daily.  Marland Kitchen aspirin 81 MG tablet Take 81 mg by mouth daily.  . Cholecalciferol (VITAMIN D3) 5000 UNITS CAPS Take 5,000 Units  by mouth daily.  . citalopram (CELEXA) 20 MG tablet Take 1 tablet daily for depression (Patient taking differently: Take 40 mg by mouth daily. Take 1 tablet daily for depression)  . donepezil (ARICEPT) 10 MG tablet Take 10 mg by mouth at bedtime.  Marland Kitchen levothyroxine (SYNTHROID) 150 MCG tablet Take 1 tablet (150 mcg total) by mouth daily. (Patient taking differently: Take 75 mcg by mouth 2 (two) times daily. )  . levothyroxine (SYNTHROID, LEVOTHROID) 50 MCG tablet   . levothyroxine (SYNTHROID, LEVOTHROID) 75 MCG tablet   . mirtazapine (REMERON) 15 MG tablet Take 15 mg by mouth at bedtime.  . Omega-3 Fatty Acids (FISH OIL) 1000 MG CAPS Take 1,000 mg by mouth daily.   Marland Kitchen sulfamethoxazole-trimethoprim (BACTRIM DS,SEPTRA DS) 800-160 MG tablet Take 1 tablet by mouth every 12 (twelve) hours. (Patient not taking: Reported on 12/20/2016)  . traMADol (ULTRAM) 50 MG tablet Take by mouth every 6 (six) hours as needed.  . vitamin B-12 (CYANOCOBALAMIN) 1000 MCG tablet Take 1,000 mcg by mouth daily.   No facility-administered encounter medications on file as of 06/19/2018.     PHYSICAL EXAM:   General: NAD, lying in bed, alert Cardiovascular: regular rate and rhythm Pulmonary: clear ant fields Abdomen: soft, nontender, + bowel sounds GU: no suprapubic tenderness Extremities: no edema, no joint deformities Skin: no rashes Neurological: Weakness but otherwise nonfocal  Janet G Martinique, NP

## 2018-09-13 DIAGNOSIS — B351 Tinea unguium: Secondary | ICD-10-CM | POA: Diagnosis not present

## 2018-09-13 DIAGNOSIS — R600 Localized edema: Secondary | ICD-10-CM | POA: Diagnosis not present

## 2018-09-13 DIAGNOSIS — L84 Corns and callosities: Secondary | ICD-10-CM | POA: Diagnosis not present

## 2018-09-13 DIAGNOSIS — I739 Peripheral vascular disease, unspecified: Secondary | ICD-10-CM | POA: Diagnosis not present

## 2018-10-23 ENCOUNTER — Non-Acute Institutional Stay: Payer: Self-pay | Admitting: Nurse Practitioner

## 2018-10-23 DIAGNOSIS — Z515 Encounter for palliative care: Secondary | ICD-10-CM

## 2018-10-23 DIAGNOSIS — R269 Unspecified abnormalities of gait and mobility: Secondary | ICD-10-CM | POA: Diagnosis not present

## 2018-10-23 DIAGNOSIS — F0390 Unspecified dementia without behavioral disturbance: Secondary | ICD-10-CM

## 2018-10-23 NOTE — Progress Notes (Signed)
    PALLIATIVE CARE CONSULT VISIT   PATIENT NAME: Janet Reeves 89 DOB: December 17, 1928 MRN: [id number]  PRIMARY CARE PROVIDER:   Jacklyn Shell, FNP  REFERRING PROVIDER:  Jacklyn Shell, [address], [place] [address]  RESPONSIBLE PARTY:  Dorris Singh (daughter) [phone] (HM) [phone] (wk) [phone] (mobile)   HISTORY OF PRESENT ILLNESS:  Janet Reeves is a 84 y.o. year old female with multiple medical problems including, advanced age,memory loss,gait disturbance , recurrent breast cancer.Palliative Care was asked to assist with symptom management and ongoing patient and family support  Collateral information from patient and staff: patient says "I feel good" reports good appetite; no pain issues; staff report no acute issues.    ASSESSMENT/RECOMMENDATIONS   Gait disturbance Memory loss Recurrent breast ca -patient ambulates with cane -no behavioral issues -weight stable -denies pain  ACP -DNR/MOST   I spent 15 minutes providing this consultation,  from 13:00 to 13:15. More than 50% of the time in this consultation was spent coordinating communication.   .CODE STATUS: DNR/MOST  PPS: 50% HOSPICE ELIGIBILITY/DIAGNOSIS: TBD  PAST MEDICAL HISTORY:  Past Medical History:  Diagnosis Date  . Breast cancer ([place])    Mastectomy and chemotherapy  . Diabetes mellitus ([place])    Diet controlled  . GERD (gastroesophageal reflux disease)   . Hyperlipidemia   . Hypertension   . Hypothyroid   . Lymphoma ([place])    Chemotherapy    SOCIAL HX:  Social History   Tobacco Use  . Smoking status: Never Smoker  . Smokeless tobacco: Never Used  Substance Use Topics  . Alcohol use: No    ALLERGIES:  Allergies  Allergen Reactions  . Codeine Nausea And Vomiting  . Macrobid [Nitrofurantoin Macrocrystal] Other (See Comments)    Dizziness.     PERTINENT MEDICATIONS:  Outpatient Encounter Medications as of 10/23/2018  Medication Sig  . acetaminophen (TYLENOL)  500 MG tablet Take 1,000 mg by mouth 3 (three) times daily.  Marland Kitchen aspirin 81 MG tablet Take 81 mg by mouth daily.  . Cholecalciferol (VITAMIN D3) 5000 UNITS CAPS Take 5,000 Units by mouth daily.  . citalopram (CELEXA) 20 MG tablet Take 1 tablet daily for depression (Patient taking differently: Take 40 mg by mouth daily. Take 1 tablet daily for depression)  . donepezil (ARICEPT) 10 MG tablet Take 10 mg by mouth at bedtime.  Marland Kitchen levothyroxine (SYNTHROID) 150 MCG tablet Take 1 tablet (150 mcg total) by mouth daily. (Patient taking differently: Take 75 mcg by mouth 2 (two) times daily. )  . levothyroxine (SYNTHROID, LEVOTHROID) 50 MCG tablet   . levothyroxine (SYNTHROID, LEVOTHROID) 75 MCG tablet   . mirtazapine (REMERON) 15 MG tablet Take 15 mg by mouth at bedtime.  . Omega-3 Fatty Acids (FISH OIL) 1000 MG CAPS Take 1,000 mg by mouth daily.   Marland Kitchen sulfamethoxazole-trimethoprim (BACTRIM DS,SEPTRA DS) 800-160 MG tablet Take 1 tablet by mouth every 12 (twelve) hours.  . traMADol (ULTRAM) 50 MG tablet Take by mouth every 6 (six) hours as needed.  . vitamin B-12 (CYANOCOBALAMIN) 1000 MCG tablet Take 1,000 mcg by mouth daily.   No facility-administered encounter medications on file as of 10/23/2018.     PHYSICAL EXAM:   General: NAD, sitting in recliner, alert Cardiovascular: regular rate and rhythm Pulmonary: clear ant fields Abdomen: soft, nontender, + bowel sounds GU: no suprapubic tenderness Extremities: no edema, no joint deformities Skin: no rashes Neurological: Weakness but otherwise nonfocal  Zyair Russi G Martinique, NP

## 2018-10-25 ENCOUNTER — Encounter: Payer: Self-pay | Admitting: Nurse Practitioner

## 2018-11-15 DIAGNOSIS — I10 Essential (primary) hypertension: Secondary | ICD-10-CM | POA: Diagnosis not present

## 2018-11-15 DIAGNOSIS — E039 Hypothyroidism, unspecified: Secondary | ICD-10-CM | POA: Diagnosis not present

## 2018-11-15 DIAGNOSIS — K219 Gastro-esophageal reflux disease without esophagitis: Secondary | ICD-10-CM | POA: Diagnosis not present

## 2018-11-15 DIAGNOSIS — Z79899 Other long term (current) drug therapy: Secondary | ICD-10-CM | POA: Diagnosis not present

## 2018-11-29 DIAGNOSIS — B351 Tinea unguium: Secondary | ICD-10-CM | POA: Diagnosis not present

## 2018-11-29 DIAGNOSIS — K219 Gastro-esophageal reflux disease without esophagitis: Secondary | ICD-10-CM | POA: Diagnosis not present

## 2018-11-29 DIAGNOSIS — N6320 Unspecified lump in the left breast, unspecified quadrant: Secondary | ICD-10-CM | POA: Diagnosis not present

## 2018-11-29 DIAGNOSIS — Z79899 Other long term (current) drug therapy: Secondary | ICD-10-CM | POA: Diagnosis not present

## 2018-11-29 DIAGNOSIS — R600 Localized edema: Secondary | ICD-10-CM | POA: Diagnosis not present

## 2018-11-29 DIAGNOSIS — I10 Essential (primary) hypertension: Secondary | ICD-10-CM | POA: Diagnosis not present

## 2018-11-29 DIAGNOSIS — L84 Corns and callosities: Secondary | ICD-10-CM | POA: Diagnosis not present

## 2018-11-29 DIAGNOSIS — I739 Peripheral vascular disease, unspecified: Secondary | ICD-10-CM | POA: Diagnosis not present

## 2018-12-11 ENCOUNTER — Non-Acute Institutional Stay: Payer: Self-pay | Admitting: Nurse Practitioner

## 2018-12-11 DIAGNOSIS — R269 Unspecified abnormalities of gait and mobility: Secondary | ICD-10-CM | POA: Diagnosis not present

## 2018-12-11 DIAGNOSIS — Z515 Encounter for palliative care: Secondary | ICD-10-CM | POA: Diagnosis not present

## 2018-12-13 ENCOUNTER — Encounter: Payer: Self-pay | Admitting: Nurse Practitioner

## 2018-12-13 NOTE — Progress Notes (Signed)
    PALLIATIVE CARE CONSULT VISIT   PATIENT NAME: Janet Reeves 89 DOB: [birth year] MRN: [id number]  PRIMARY CARE PROVIDER:   Jacklyn Shell, FNP  REFERRING PROVIDER:  Jacklyn Shell, [address] [place], [place] [address]  RESPONSIBLE PARTY:  Dorris Singh (daughter) [phone] (HM) [phone] (wk) [phone] (mobile)   HISTORY OF PRESENT ILLNESS:  Janet JEANLOUIS is a 85 y.o. year old female with multiple medical problems including, advanced age,memory loss,gait disturbance , recurrent breast cancer.Palliative Care was asked to assist with symptom management and ongoing patient and family support  Collateral information from patient and staff: patient with no acute complaints;" feel good" reports good appetite; no pain issues; staff report no acute issues.    ASSESSMENT/RECOMMENDATIONS   Gait disturbance Memory loss Recurrent breast ca -patient ambulates with cane -no behavioral issues -weight stable -denies pain  ACP -DNR/MOST   I spent 15 minutes providing this consultation,  from 13:00 to 13:15. More than 50% of the time in this consultation was spent coordinating communication.   .CODE STATUS: DNR/MOST  PPS: 50% HOSPICE ELIGIBILITY/DIAGNOSIS: TBD  PAST MEDICAL HISTORY:  Past Medical History:  Diagnosis Date  . Breast cancer ([place])    Mastectomy and chemotherapy  . Diabetes mellitus ([place])    Diet controlled  . GERD (gastroesophageal reflux disease)   . Hyperlipidemia   . Hypertension   . Hypothyroid   . Lymphoma ([place])    Chemotherapy    SOCIAL HX:  Social History   Tobacco Use  . Smoking status: Never Smoker  . Smokeless tobacco: Never Used  Substance Use Topics  . Alcohol use: No    ALLERGIES:  Allergies  Allergen Reactions  . Codeine Nausea And Vomiting  . Macrobid [Nitrofurantoin Macrocrystal] Other (See Comments)    Dizziness.     PERTINENT MEDICATIONS:  Outpatient Encounter Medications as of 12/11/2018  Medication Sig  .  acetaminophen (TYLENOL) 500 MG tablet Take 1,000 mg by mouth 3 (three) times daily.  Marland Kitchen aspirin 81 MG tablet Take 81 mg by mouth daily.  . Cholecalciferol (VITAMIN D3) 5000 UNITS CAPS Take 5,000 Units by mouth daily.  Marland Kitchen donepezil (ARICEPT) 10 MG tablet Take 10 mg by mouth at bedtime.  Marland Kitchen levothyroxine (SYNTHROID, LEVOTHROID) 50 MCG tablet   . levothyroxine (SYNTHROID, LEVOTHROID) 75 MCG tablet   . mirtazapine (REMERON) 15 MG tablet Take 15 mg by mouth at bedtime.  . Omega-3 Fatty Acids (FISH OIL) 1000 MG CAPS Take 1,000 mg by mouth daily.   Marland Kitchen sulfamethoxazole-trimethoprim (BACTRIM DS,SEPTRA DS) 800-160 MG tablet Take 1 tablet by mouth every 12 (twelve) hours.  . traMADol (ULTRAM) 50 MG tablet Take by mouth every 6 (six) hours as needed.  . vitamin B-12 (CYANOCOBALAMIN) 1000 MCG tablet Take 1,000 mcg by mouth daily.   No facility-administered encounter medications on file as of 12/11/2018.     PHYSICAL EXAM:   General: NAD, sitting in recliner, alert Cardiovascular: regular rate and rhythm Pulmonary: clear ant fields Abdomen: soft, nontender, + bowel sounds GU: no suprapubic tenderness Extremities: no edema, no joint deformities Skin: no rashes Neurological: Weakness but otherwise nonfocal  Stephanie G Martinique, NP

## 2018-12-19 DIAGNOSIS — I10 Essential (primary) hypertension: Secondary | ICD-10-CM | POA: Diagnosis not present

## 2018-12-19 DIAGNOSIS — Z79899 Other long term (current) drug therapy: Secondary | ICD-10-CM | POA: Diagnosis not present

## 2019-01-29 DIAGNOSIS — R600 Localized edema: Secondary | ICD-10-CM | POA: Diagnosis not present

## 2019-01-29 DIAGNOSIS — L84 Corns and callosities: Secondary | ICD-10-CM | POA: Diagnosis not present

## 2019-01-29 DIAGNOSIS — B351 Tinea unguium: Secondary | ICD-10-CM | POA: Diagnosis not present

## 2019-01-29 DIAGNOSIS — I739 Peripheral vascular disease, unspecified: Secondary | ICD-10-CM | POA: Diagnosis not present

## 2019-01-31 DIAGNOSIS — R2681 Unsteadiness on feet: Secondary | ICD-10-CM | POA: Diagnosis not present

## 2019-01-31 DIAGNOSIS — I10 Essential (primary) hypertension: Secondary | ICD-10-CM | POA: Diagnosis not present

## 2019-01-31 DIAGNOSIS — Z79899 Other long term (current) drug therapy: Secondary | ICD-10-CM | POA: Diagnosis not present

## 2019-03-21 DIAGNOSIS — Z79899 Other long term (current) drug therapy: Secondary | ICD-10-CM | POA: Diagnosis not present

## 2019-03-21 DIAGNOSIS — E538 Deficiency of other specified B group vitamins: Secondary | ICD-10-CM | POA: Diagnosis not present

## 2019-03-21 DIAGNOSIS — R55 Syncope and collapse: Secondary | ICD-10-CM | POA: Diagnosis not present

## 2019-03-22 DIAGNOSIS — R55 Syncope and collapse: Secondary | ICD-10-CM | POA: Diagnosis not present

## 2019-04-15 DIAGNOSIS — E039 Hypothyroidism, unspecified: Secondary | ICD-10-CM | POA: Diagnosis not present

## 2019-04-15 DIAGNOSIS — Z79899 Other long term (current) drug therapy: Secondary | ICD-10-CM | POA: Diagnosis not present

## 2019-05-23 ENCOUNTER — Other Ambulatory Visit: Payer: Self-pay

## 2019-05-23 ENCOUNTER — Non-Acute Institutional Stay: Payer: Medicare Other | Admitting: Adult Health Nurse Practitioner

## 2019-05-23 DIAGNOSIS — Z515 Encounter for palliative care: Secondary | ICD-10-CM

## 2019-05-23 NOTE — Progress Notes (Signed)
[facility] Consult Note Telephone: [phone]  Fax: [phone]  PATIENT NAME: Janet Reeves DOB: 10-14-1929 MRN: [id number]  PRIMARY CARE PROVIDER:   Jacklyn Shell, FNP  REFERRING PROVIDER:  Jacklyn Shell, [place] [address],   [address]  RESPONSIBLE PARTY:   Dorris Singh (daughter) [phone] (HM) [phone] (wk) [phone] (mobile)  Due to the COVID-19 crisis, this visit was done via telemedicine and it was initiated and consent by this patient and or family.  Visit was via Duo on smart phones assisted by [facility] of nursing at facility, Elliot Gault.   RECOMMENDATIONS and PLAN:  1.  Functional status.  Patient is able to ambulate shorts distances with cane.  She requires minimal assistance with ADLs.  No changes in appetite or weight.  She has no new concerns today and no concerns reported by staff.  2. Goals of Care.  Patient is a DNR.  No reported falls, infections, hospitalizations since last visit.  Patient stable. Continue supportive care.  I spent 20 minutes providing this consultation,  from 11:20 to 11:40. More than 50% of the time in this consultation was spent coordinating communication.   HISTORY OF PRESENT ILLNESS:  Janet Reeves is a 85 y.o. year old female with multiple medical problems including memory loss,gait disturbance , recurrent breast cancer. Palliative Care was asked to help address goals of care.   CODE STATUS: DNR  PPS: 50% HOSPICE ELIGIBILITY/DIAGNOSIS: TBD  PHYSICAL EXAM:   General: patient sitting in recliner in NAD Neurological: patient has forgetfulness  PAST MEDICAL HISTORY:  Past Medical History:  Diagnosis Date  . Breast cancer ([place])    Mastectomy and chemotherapy  . Diabetes mellitus ([place])    Diet controlled  . GERD (gastroesophageal reflux disease)   . Hyperlipidemia   . Hypertension   . Hypothyroid   . Lymphoma ([place])    Chemotherapy    SOCIAL HX:   Social History   Tobacco Use  . Smoking status: Never Smoker  . Smokeless tobacco: Never Used  Substance Use Topics  . Alcohol use: No    ALLERGIES:  Allergies  Allergen Reactions  . Codeine Nausea And Vomiting  . Macrobid [Nitrofurantoin Macrocrystal] Other (See Comments)    Dizziness.     PERTINENT MEDICATIONS:  Outpatient Encounter Medications as of 05/23/2019  Medication Sig  . acetaminophen (TYLENOL) 500 MG tablet Take 1,000 mg by mouth 3 (three) times daily.  Marland Kitchen aspirin 81 MG tablet Take 81 mg by mouth daily.  . Cholecalciferol (VITAMIN D3) 5000 UNITS CAPS Take 5,000 Units by mouth daily.  . citalopram (CELEXA) 20 MG tablet Take 1 tablet daily for depression (Patient taking differently: Take 40 mg by mouth daily. Take 1 tablet daily for depression)  . donepezil (ARICEPT) 10 MG tablet Take 10 mg by mouth at bedtime.  Marland Kitchen levothyroxine (SYNTHROID) 150 MCG tablet Take 1 tablet (150 mcg total) by mouth daily. (Patient taking differently: Take 75 mcg by mouth 2 (two) times daily. )  . levothyroxine (SYNTHROID, LEVOTHROID) 50 MCG tablet   . levothyroxine (SYNTHROID, LEVOTHROID) 75 MCG tablet   . mirtazapine (REMERON) 15 MG tablet Take 15 mg by mouth at bedtime.  . Omega-3 Fatty Acids (FISH OIL) 1000 MG CAPS Take 1,000 mg by mouth daily.   Marland Kitchen sulfamethoxazole-trimethoprim (BACTRIM DS,SEPTRA DS) 800-160 MG tablet Take 1 tablet by mouth every 12 (twelve) hours.  . traMADol (ULTRAM) 50 MG tablet Take by mouth every 6 (six) hours as  needed.  . vitamin B-12 (CYANOCOBALAMIN) 1000 MCG tablet Take 1,000 mcg by mouth daily.   No facility-administered encounter medications on file as of 05/23/2019.       Wm Fruchter Jenetta Downer, NP

## 2019-05-30 DIAGNOSIS — E876 Hypokalemia: Secondary | ICD-10-CM | POA: Diagnosis not present

## 2019-05-30 DIAGNOSIS — Z79899 Other long term (current) drug therapy: Secondary | ICD-10-CM | POA: Diagnosis not present

## 2019-05-30 DIAGNOSIS — E039 Hypothyroidism, unspecified: Secondary | ICD-10-CM | POA: Diagnosis not present

## 2019-05-30 DIAGNOSIS — G309 Alzheimer's disease, unspecified: Secondary | ICD-10-CM | POA: Diagnosis not present

## 2019-05-30 DIAGNOSIS — Z712 Person consulting for explanation of examination or test findings: Secondary | ICD-10-CM | POA: Diagnosis not present

## 2019-06-16 DIAGNOSIS — E039 Hypothyroidism, unspecified: Secondary | ICD-10-CM | POA: Diagnosis not present

## 2019-06-16 DIAGNOSIS — Z79899 Other long term (current) drug therapy: Secondary | ICD-10-CM | POA: Diagnosis not present

## 2019-06-30 DIAGNOSIS — I739 Peripheral vascular disease, unspecified: Secondary | ICD-10-CM | POA: Diagnosis not present

## 2019-06-30 DIAGNOSIS — B351 Tinea unguium: Secondary | ICD-10-CM | POA: Diagnosis not present

## 2019-07-03 DIAGNOSIS — Z79899 Other long term (current) drug therapy: Secondary | ICD-10-CM | POA: Diagnosis not present

## 2019-07-03 DIAGNOSIS — E039 Hypothyroidism, unspecified: Secondary | ICD-10-CM | POA: Diagnosis not present

## 2019-08-01 ENCOUNTER — Non-Acute Institutional Stay: Payer: Medicare Other | Admitting: Hospice

## 2019-08-01 ENCOUNTER — Other Ambulatory Visit: Payer: Self-pay

## 2019-08-01 DIAGNOSIS — Z515 Encounter for palliative care: Secondary | ICD-10-CM

## 2019-08-01 NOTE — Progress Notes (Signed)
[facility] Consult Note Telephone: [phone]  Fax: [phone]  PATIENT NAME: Janet Reeves DOB: 05-24-1929 MRN: [id number]  PRIMARY CARE PROVIDER:   Jacklyn Shell, FNP  REFERRING PROVIDER:  Jacklyn Shell, [place],  [place] [address]  RESPONSIBLE PARTY:   Dorris Singh (daughter) [phone] (HM) [phone] (wk) [phone] (mobile)     RECOMMENDATIONS and PLAN:   1. Advance Care Planning/Goals of Care: Goals include to maximize quality of life and symptom management. DNR and MOST form in the chart. Will upload into [facility] 2. Memory loss: at baseline. Dementia FAST 6d. Continue ongoing supportive nursing care. Skin/incontinent care, Falls and aspiration precautions as disease trajectory is gradual progression.  3. Follow up Palliative Care Visit: Palliative care will continue to follow for goals of care clarification and symptom management.   I spent 25 mins minutes providing this consultation,  from 11.00am to 11.25 am. More than 50% of the time in this consultation was spent coordinating communication. Staff with no concerns today  HISTORY OF PRESENT ILLNESS:  Janet Reeves is a 85 y.o. year old female with multiple medical problems including Dementia, gait disturbance, hypothyroidism,  recurrent breast cancer. Palliative Care was asked to help address goals of care.   CODE STATUS: DNR  PPS: 50% HOSPICE ELIGIBILITY/DIAGNOSIS: TBD  PAST MEDICAL HISTORY:  Past Medical History:  Diagnosis Date  . Breast cancer ([place])    Mastectomy and chemotherapy  . Diabetes mellitus ([place])    Diet controlled  . GERD (gastroesophageal reflux disease)   . Hyperlipidemia   . Hypertension   . Hypothyroid   . Lymphoma ([place])    Chemotherapy    SOCIAL HX:  Social History   Tobacco Use  . Smoking status: Never Smoker  . Smokeless tobacco: Never Used  Substance Use Topics  . Alcohol use: No    ALLERGIES:  Allergies   Allergen Reactions  . Codeine Nausea And Vomiting  . Macrobid [Nitrofurantoin Macrocrystal] Other (See Comments)    Dizziness.     PERTINENT MEDICATIONS:  Outpatient Encounter Medications as of 08/01/2019  Medication Sig  . acetaminophen (TYLENOL) 500 MG tablet Take 1,000 mg by mouth 3 (three) times daily.  Marland Kitchen aspirin 81 MG tablet Take 81 mg by mouth daily.  . Cholecalciferol (VITAMIN D3) 5000 UNITS CAPS Take 5,000 Units by mouth daily.  . citalopram (CELEXA) 20 MG tablet Take 1 tablet daily for depression (Patient taking differently: Take 40 mg by mouth daily. Take 1 tablet daily for depression)  . donepezil (ARICEPT) 10 MG tablet Take 10 mg by mouth at bedtime.  Marland Kitchen levothyroxine (SYNTHROID) 150 MCG tablet Take 1 tablet (150 mcg total) by mouth daily. (Patient taking differently: Take 75 mcg by mouth 2 (two) times daily. )  . levothyroxine (SYNTHROID, LEVOTHROID) 50 MCG tablet   . levothyroxine (SYNTHROID, LEVOTHROID) 75 MCG tablet   . mirtazapine (REMERON) 15 MG tablet Take 15 mg by mouth at bedtime.  . Omega-3 Fatty Acids (FISH OIL) 1000 MG CAPS Take 1,000 mg by mouth daily.   Marland Kitchen sulfamethoxazole-trimethoprim (BACTRIM DS,SEPTRA DS) 800-160 MG tablet Take 1 tablet by mouth every 12 (twelve) hours.  . traMADol (ULTRAM) 50 MG tablet Take by mouth every 6 (six) hours as needed.  . vitamin B-12 (CYANOCOBALAMIN) 1000 MCG tablet Take 1,000 mcg by mouth daily.   No facility-administered encounter medications on file as of 08/01/2019.     PHYSICAL EXAM:   General: cooperative, in no acute  distress Cardiovascular: regular rate and rhythm, denied pain/discomfort Pulmonary: clear ant fields, no SOB Abdomen: soft, nontender, + bowel sounds Extremities: no edema, no joint deformities Skin: no rashes or wounds on exposed skin Neurological: Weakness, forgetful  Teodoro Spray, NP

## 2019-08-09 DIAGNOSIS — E039 Hypothyroidism, unspecified: Secondary | ICD-10-CM | POA: Diagnosis not present

## 2019-08-09 DIAGNOSIS — Z79899 Other long term (current) drug therapy: Secondary | ICD-10-CM | POA: Diagnosis not present

## 2019-09-03 DIAGNOSIS — L84 Corns and callosities: Secondary | ICD-10-CM | POA: Diagnosis not present

## 2019-09-03 DIAGNOSIS — R600 Localized edema: Secondary | ICD-10-CM | POA: Diagnosis not present

## 2019-09-03 DIAGNOSIS — I739 Peripheral vascular disease, unspecified: Secondary | ICD-10-CM | POA: Diagnosis not present

## 2019-09-03 DIAGNOSIS — B351 Tinea unguium: Secondary | ICD-10-CM | POA: Diagnosis not present

## 2019-09-11 ENCOUNTER — Other Ambulatory Visit: Payer: Self-pay

## 2019-09-11 ENCOUNTER — Non-Acute Institutional Stay: Payer: Medicare Other | Admitting: Hospice

## 2019-09-11 DIAGNOSIS — Z515 Encounter for palliative care: Secondary | ICD-10-CM

## 2019-09-11 NOTE — Progress Notes (Signed)
[facility] Consult Note Telephone: [phone]  Fax: [phone]  PATIENT NAME: Janet Reeves DOB: December 23, 1928 MRN: [id number]  PRIMARY CARE PROVIDER:   Jacklyn Shell, FNP  REFERRING PROVIDER:  Jacklyn Shell, [place],  [place] [address]  RESPONSIBLE PARTY:Barbara Key (daughter) [phone] (HM) [phone] (wk) [phone] (mobile)  TELEHEALTH VISIT STATEMENT Due to the COVID-19 crisis, this visit was done via telephone from my office. It was initiated and consented to by this patient and/or family.  RECOMMENDATIONS/PLAN:   Advance Care Planning/Goals of Care: Telehealth visit facilitated by Director of patient care Mellinda. Patient remains a DNR, with MOST form  up to date in chart and in [facility]. Goals of care include to maximize quality of life and symptom management. Called Linda to update; left her a voicemail with call back number.  Symptom management: Memory loss, gradual decline in functional status r/t Dementia. Patient continues to ambulate with walker, knows when to use bathroom but no longer able to wipe clean. Recent facility careplan adjusted to provide her more assistance with bathing and hygiene care. Encouraged ongoing supportive nursing care. No acute concerns.   Follow up: Palliative care will continue to follow patient for goals of care clarification and symptom management I spent 20 minutes providing this consultation, from 3.30pm to 3.50pm. More than 50% of the time in this consultation was spent on coordinating communication.communication.   HISTORY OF PRESENT ILLNESS:Janet S Germanis a 85 y.o.year oldfemalewith multiple medical problems including Dementia, gait disturbance, hypothyroidism,  recurrent breast cancer. Palliative Care was asked to help address goals of care.   CODE STATUS: DNR  PPS: 50% HOSPICE ELIGIBILITY/DIAGNOSIS: TBD  PAST MEDICAL HISTORY:  Past Medical History:   Diagnosis Date  . Breast cancer ([place])    Mastectomy and chemotherapy  . Diabetes mellitus ([place])    Diet controlled  . GERD (gastroesophageal reflux disease)   . Hyperlipidemia   . Hypertension   . Hypothyroid   . Lymphoma ([place])    Chemotherapy    SOCIAL HX:  Social History   Tobacco Use  . Smoking status: Never Smoker  . Smokeless tobacco: Never Used  Substance Use Topics  . Alcohol use: No    ALLERGIES:  Allergies  Allergen Reactions  . Codeine Nausea And Vomiting  . Macrobid [Nitrofurantoin Macrocrystal] Other (See Comments)    Dizziness.     PERTINENT MEDICATIONS:  Outpatient Encounter Medications as of 83/01/2019  Medication Sig  . acetaminophen (TYLENOL) 500 MG tablet Take 1,000 mg by mouth 3 (three) times daily.  Marland Kitchen aspirin 81 MG tablet Take 81 mg by mouth daily.  . Cholecalciferol (VITAMIN D3) 5000 UNITS CAPS Take 5,000 Units by mouth daily.  . citalopram (CELEXA) 20 MG tablet Take 1 tablet daily for depression (Patient taking differently: Take 40 mg by mouth daily. Take 1 tablet daily for depression)  . donepezil (ARICEPT) 10 MG tablet Take 10 mg by mouth at bedtime.  Marland Kitchen levothyroxine (SYNTHROID) 150 MCG tablet Take 1 tablet (150 mcg total) by mouth daily. (Patient taking differently: Take 75 mcg by mouth 2 (two) times daily. )  . levothyroxine (SYNTHROID, LEVOTHROID) 50 MCG tablet   . levothyroxine (SYNTHROID, LEVOTHROID) 75 MCG tablet   . mirtazapine (REMERON) 15 MG tablet Take 15 mg by mouth at bedtime.  . Omega-3 Fatty Acids (FISH OIL) 1000 MG CAPS Take 1,000 mg by mouth daily.   Marland Kitchen sulfamethoxazole-trimethoprim (BACTRIM DS,SEPTRA DS) 800-160 MG tablet Take 1 tablet by  mouth every 12 (twelve) hours.  . traMADol (ULTRAM) 50 MG tablet Take by mouth every 6 (six) hours as needed.  . vitamin B-12 (CYANOCOBALAMIN) 1000 MCG tablet Take 1,000 mcg by mouth daily.   No facility-administered encounter medications on file as of 83/01/2019.     Teodoro Spray, NP

## 2019-09-19 ENCOUNTER — Inpatient Hospital Stay (HOSPITAL_COMMUNITY)
Admission: EM | Admit: 2019-09-19 | Discharge: 2019-10-09 | DRG: 291 | Disposition: E | Payer: Medicare Other | Attending: Internal Medicine | Admitting: Internal Medicine

## 2019-09-19 ENCOUNTER — Other Ambulatory Visit (HOSPITAL_COMMUNITY): Payer: Self-pay

## 2019-09-19 ENCOUNTER — Other Ambulatory Visit: Payer: Self-pay

## 2019-09-19 ENCOUNTER — Emergency Department (HOSPITAL_COMMUNITY): Payer: Medicare Other

## 2019-09-19 DIAGNOSIS — F419 Anxiety disorder, unspecified: Secondary | ICD-10-CM | POA: Diagnosis present

## 2019-09-19 DIAGNOSIS — J9601 Acute respiratory failure with hypoxia: Secondary | ICD-10-CM | POA: Diagnosis present

## 2019-09-19 DIAGNOSIS — J189 Pneumonia, unspecified organism: Secondary | ICD-10-CM | POA: Diagnosis present

## 2019-09-19 DIAGNOSIS — E785 Hyperlipidemia, unspecified: Secondary | ICD-10-CM | POA: Diagnosis not present

## 2019-09-19 DIAGNOSIS — Z79899 Other long term (current) drug therapy: Secondary | ICD-10-CM

## 2019-09-19 DIAGNOSIS — I441 Atrioventricular block, second degree: Secondary | ICD-10-CM | POA: Diagnosis not present

## 2019-09-19 DIAGNOSIS — Z8249 Family history of ischemic heart disease and other diseases of the circulatory system: Secondary | ICD-10-CM

## 2019-09-19 DIAGNOSIS — N179 Acute kidney failure, unspecified: Secondary | ICD-10-CM | POA: Diagnosis not present

## 2019-09-19 DIAGNOSIS — E039 Hypothyroidism, unspecified: Secondary | ICD-10-CM | POA: Diagnosis not present

## 2019-09-19 DIAGNOSIS — C50412 Malignant neoplasm of upper-outer quadrant of left female breast: Secondary | ICD-10-CM | POA: Diagnosis not present

## 2019-09-19 DIAGNOSIS — I495 Sick sinus syndrome: Secondary | ICD-10-CM | POA: Diagnosis not present

## 2019-09-19 DIAGNOSIS — I4891 Unspecified atrial fibrillation: Secondary | ICD-10-CM | POA: Diagnosis not present

## 2019-09-19 DIAGNOSIS — I4821 Permanent atrial fibrillation: Secondary | ICD-10-CM | POA: Diagnosis not present

## 2019-09-19 DIAGNOSIS — Z79891 Long term (current) use of opiate analgesic: Secondary | ICD-10-CM

## 2019-09-19 DIAGNOSIS — N390 Urinary tract infection, site not specified: Secondary | ICD-10-CM | POA: Diagnosis present

## 2019-09-19 DIAGNOSIS — I350 Nonrheumatic aortic (valve) stenosis: Secondary | ICD-10-CM | POA: Diagnosis not present

## 2019-09-19 DIAGNOSIS — Z66 Do not resuscitate: Secondary | ICD-10-CM | POA: Diagnosis present

## 2019-09-19 DIAGNOSIS — Z515 Encounter for palliative care: Secondary | ICD-10-CM | POA: Diagnosis not present

## 2019-09-19 DIAGNOSIS — I083 Combined rheumatic disorders of mitral, aortic and tricuspid valves: Secondary | ICD-10-CM | POA: Diagnosis not present

## 2019-09-19 DIAGNOSIS — Z8572 Personal history of non-Hodgkin lymphomas: Secondary | ICD-10-CM

## 2019-09-19 DIAGNOSIS — R0902 Hypoxemia: Secondary | ICD-10-CM | POA: Diagnosis not present

## 2019-09-19 DIAGNOSIS — I10 Essential (primary) hypertension: Secondary | ICD-10-CM | POA: Diagnosis present

## 2019-09-19 DIAGNOSIS — I5031 Acute diastolic (congestive) heart failure: Secondary | ICD-10-CM | POA: Diagnosis not present

## 2019-09-19 DIAGNOSIS — K529 Noninfective gastroenteritis and colitis, unspecified: Secondary | ICD-10-CM | POA: Diagnosis not present

## 2019-09-19 DIAGNOSIS — Z20828 Contact with and (suspected) exposure to other viral communicable diseases: Secondary | ICD-10-CM | POA: Diagnosis present

## 2019-09-19 DIAGNOSIS — I110 Hypertensive heart disease with heart failure: Secondary | ICD-10-CM | POA: Diagnosis not present

## 2019-09-19 DIAGNOSIS — C50919 Malignant neoplasm of unspecified site of unspecified female breast: Secondary | ICD-10-CM | POA: Diagnosis present

## 2019-09-19 DIAGNOSIS — E872 Acidosis: Secondary | ICD-10-CM | POA: Diagnosis present

## 2019-09-19 DIAGNOSIS — J449 Chronic obstructive pulmonary disease, unspecified: Secondary | ICD-10-CM | POA: Diagnosis present

## 2019-09-19 DIAGNOSIS — I340 Nonrheumatic mitral (valve) insufficiency: Secondary | ICD-10-CM | POA: Diagnosis not present

## 2019-09-19 DIAGNOSIS — Z885 Allergy status to narcotic agent status: Secondary | ICD-10-CM

## 2019-09-19 DIAGNOSIS — I509 Heart failure, unspecified: Secondary | ICD-10-CM | POA: Diagnosis not present

## 2019-09-19 DIAGNOSIS — R109 Unspecified abdominal pain: Secondary | ICD-10-CM

## 2019-09-19 DIAGNOSIS — J9690 Respiratory failure, unspecified, unspecified whether with hypoxia or hypercapnia: Secondary | ICD-10-CM | POA: Diagnosis not present

## 2019-09-19 DIAGNOSIS — R0603 Acute respiratory distress: Secondary | ICD-10-CM | POA: Diagnosis not present

## 2019-09-19 DIAGNOSIS — I471 Supraventricular tachycardia: Secondary | ICD-10-CM | POA: Diagnosis not present

## 2019-09-19 DIAGNOSIS — K219 Gastro-esophageal reflux disease without esophagitis: Secondary | ICD-10-CM | POA: Diagnosis not present

## 2019-09-19 DIAGNOSIS — Z809 Family history of malignant neoplasm, unspecified: Secondary | ICD-10-CM

## 2019-09-19 DIAGNOSIS — R5383 Other fatigue: Secondary | ICD-10-CM | POA: Diagnosis not present

## 2019-09-19 DIAGNOSIS — Z853 Personal history of malignant neoplasm of breast: Secondary | ICD-10-CM

## 2019-09-19 DIAGNOSIS — Z881 Allergy status to other antibiotic agents status: Secondary | ICD-10-CM

## 2019-09-19 DIAGNOSIS — R195 Other fecal abnormalities: Secondary | ICD-10-CM | POA: Diagnosis not present

## 2019-09-19 DIAGNOSIS — E119 Type 2 diabetes mellitus without complications: Secondary | ICD-10-CM | POA: Diagnosis present

## 2019-09-19 DIAGNOSIS — E860 Dehydration: Secondary | ICD-10-CM | POA: Diagnosis not present

## 2019-09-19 DIAGNOSIS — R0602 Shortness of breath: Secondary | ICD-10-CM | POA: Diagnosis not present

## 2019-09-19 DIAGNOSIS — F0390 Unspecified dementia without behavioral disturbance: Secondary | ICD-10-CM | POA: Diagnosis present

## 2019-09-19 DIAGNOSIS — J9600 Acute respiratory failure, unspecified whether with hypoxia or hypercapnia: Secondary | ICD-10-CM | POA: Diagnosis not present

## 2019-09-19 DIAGNOSIS — K449 Diaphragmatic hernia without obstruction or gangrene: Secondary | ICD-10-CM | POA: Diagnosis present

## 2019-09-19 DIAGNOSIS — R918 Other nonspecific abnormal finding of lung field: Secondary | ICD-10-CM | POA: Diagnosis not present

## 2019-09-19 DIAGNOSIS — G309 Alzheimer's disease, unspecified: Secondary | ICD-10-CM | POA: Diagnosis not present

## 2019-09-19 DIAGNOSIS — Z79890 Hormone replacement therapy (postmenopausal): Secondary | ICD-10-CM

## 2019-09-19 DIAGNOSIS — R34 Anuria and oliguria: Secondary | ICD-10-CM

## 2019-09-19 DIAGNOSIS — I480 Paroxysmal atrial fibrillation: Secondary | ICD-10-CM | POA: Diagnosis present

## 2019-09-19 DIAGNOSIS — R069 Unspecified abnormalities of breathing: Secondary | ICD-10-CM | POA: Diagnosis not present

## 2019-09-19 DIAGNOSIS — T17908A Unspecified foreign body in respiratory tract, part unspecified causing other injury, initial encounter: Secondary | ICD-10-CM

## 2019-09-19 DIAGNOSIS — Z9049 Acquired absence of other specified parts of digestive tract: Secondary | ICD-10-CM

## 2019-09-19 DIAGNOSIS — Z7982 Long term (current) use of aspirin: Secondary | ICD-10-CM

## 2019-09-19 DIAGNOSIS — Z743 Need for continuous supervision: Secondary | ICD-10-CM | POA: Diagnosis not present

## 2019-09-19 DIAGNOSIS — Z9011 Acquired absence of right breast and nipple: Secondary | ICD-10-CM

## 2019-09-19 DIAGNOSIS — Z9221 Personal history of antineoplastic chemotherapy: Secondary | ICD-10-CM

## 2019-09-19 DIAGNOSIS — R000 Tachycardia, unspecified: Secondary | ICD-10-CM | POA: Diagnosis not present

## 2019-09-19 LAB — CBC WITH DIFFERENTIAL/PLATELET
Abs Immature Granulocytes: 0.05 10*3/uL (ref 0.00–0.07)
Basophils Absolute: 0.1 10*3/uL (ref 0.0–0.1)
Basophils Relative: 1 %
Eosinophils Absolute: 0.1 10*3/uL (ref 0.0–0.5)
Eosinophils Relative: 1 %
HCT: 43.8 % (ref 36.0–46.0)
Hemoglobin: 14.4 g/dL (ref 12.0–15.0)
Immature Granulocytes: 0 %
Lymphocytes Relative: 7 %
Lymphs Abs: 0.8 10*3/uL (ref 0.7–4.0)
MCH: 30.6 pg (ref 26.0–34.0)
MCHC: 32.9 g/dL (ref 30.0–36.0)
MCV: 93.2 fL (ref 80.0–100.0)
Monocytes Absolute: 0.6 10*3/uL (ref 0.1–1.0)
Monocytes Relative: 5 %
Neutro Abs: 10.2 10*3/uL — ABNORMAL HIGH (ref 1.7–7.7)
Neutrophils Relative %: 86 %
Platelets: ADEQUATE 10*3/uL (ref 150–400)
RBC: 4.7 MIL/uL (ref 3.87–5.11)
RDW: 14.3 % (ref 11.5–15.5)
WBC: 11.8 10*3/uL — ABNORMAL HIGH (ref 4.0–10.5)
nRBC: 0 % (ref 0.0–0.2)

## 2019-09-19 LAB — RESPIRATORY PANEL BY PCR

## 2019-09-19 LAB — TROPONIN I (HIGH SENSITIVITY)
Troponin I (High Sensitivity): 29 ng/L — ABNORMAL HIGH (ref <18)
Troponin I (High Sensitivity): 30 ng/L — ABNORMAL HIGH (ref <18)

## 2019-09-19 LAB — COMPREHENSIVE METABOLIC PANEL
ALT: 11 U/L (ref 0–44)
AST: 16 U/L (ref 15–41)
Albumin: 3.3 g/dL — ABNORMAL LOW (ref 3.5–5.0)
Alkaline Phosphatase: 51 U/L (ref 38–126)
Anion gap: 17 — ABNORMAL HIGH (ref 5–15)
BUN: 16 mg/dL (ref 8–23)
CO2: 18 mmol/L — ABNORMAL LOW (ref 22–32)
Calcium: 9.9 mg/dL (ref 8.9–10.3)
Chloride: 103 mmol/L (ref 98–111)
Creatinine, Ser: 1.2 mg/dL — ABNORMAL HIGH (ref 0.44–1.00)
GFR calc Af Amer: 47 mL/min — ABNORMAL LOW (ref >60)
GFR calc non Af Amer: 40 mL/min — ABNORMAL LOW (ref >60)
Glucose, Bld: 157 mg/dL — ABNORMAL HIGH (ref 70–99)
Potassium: 4.3 mmol/L (ref 3.5–5.1)
Sodium: 138 mmol/L (ref 135–145)
Total Bilirubin: 0.8 mg/dL (ref 0.3–1.2)
Total Protein: 6.5 g/dL (ref 6.5–8.1)

## 2019-09-19 LAB — C-REACTIVE PROTEIN: CRP: 2.2 mg/dL — ABNORMAL HIGH (ref <1.0)

## 2019-09-19 LAB — FIBRINOGEN: Fibrinogen: 598 mg/dL — ABNORMAL HIGH (ref 210–475)

## 2019-09-19 LAB — D-DIMER, QUANTITATIVE: D-Dimer, Quant: 1.61 ug/mL-FEU — ABNORMAL HIGH (ref 0.00–0.50)

## 2019-09-19 LAB — BRAIN NATRIURETIC PEPTIDE: B Natriuretic Peptide: 107.2 pg/mL — ABNORMAL HIGH (ref 0.0–100.0)

## 2019-09-19 LAB — LACTIC ACID, PLASMA
Lactic Acid, Venous: 2.4 mmol/L (ref 0.5–1.9)
Lactic Acid, Venous: 1.9 mmol/L (ref 0.5–1.9)

## 2019-09-19 LAB — SARS CORONAVIRUS 2 (TAT 6-24 HRS): SARS Coronavirus 2: NEGATIVE

## 2019-09-19 LAB — LACTATE DEHYDROGENASE: LDH: 150 U/L (ref 98–192)

## 2019-09-19 LAB — FERRITIN: Ferritin: 61 ng/mL (ref 11–307)

## 2019-09-19 MED ORDER — SODIUM CHLORIDE 0.9 % IV BOLUS
250.0000 mL | Freq: Once | INTRAVENOUS | Status: AC
  Administered 2019-09-19: 250 mL via INTRAVENOUS

## 2019-09-19 MED ORDER — ENOXAPARIN SODIUM 30 MG/0.3ML ~~LOC~~ SOLN
30.0000 mg | Freq: Every day | SUBCUTANEOUS | Status: DC
  Administered 2019-09-19 – 2019-09-22 (×4): 30 mg via SUBCUTANEOUS
  Filled 2019-09-19 (×4): qty 0.3

## 2019-09-19 MED ORDER — AZITHROMYCIN 250 MG PO TABS: 250.0000 mg | ORAL_TABLET | Freq: Every day | ORAL | Status: DC

## 2019-09-19 MED ORDER — SODIUM CHLORIDE 0.9 % IV SOLN
500.0000 mg | Freq: Once | INTRAVENOUS | Status: AC
  Administered 2019-09-19: 500 mg via INTRAVENOUS
  Filled 2019-09-19: qty 500

## 2019-09-19 MED ORDER — CITALOPRAM HYDROBROMIDE 10 MG PO TABS: 40.0000 mg | ORAL_TABLET | Freq: Every day | ORAL | Status: DC

## 2019-09-19 MED ORDER — MIRTAZAPINE 15 MG PO TABS: 15.0000 mg | ORAL_TABLET | Freq: Every day | ORAL | Status: DC

## 2019-09-19 MED ORDER — VENLAFAXINE HCL ER 75 MG PO CP24
75.0000 mg | ORAL_CAPSULE | Freq: Every day | ORAL | Status: DC
  Administered 2019-09-20 – 2019-09-25 (×6): 75 mg via ORAL
  Filled 2019-09-19 (×7): qty 1

## 2019-09-19 MED ORDER — LEVOTHYROXINE SODIUM 112 MCG PO TABS
112.0000 ug | ORAL_TABLET | Freq: Every day | ORAL | Status: DC
  Administered 2019-09-20 – 2019-09-21 (×2): 112 ug via ORAL
  Filled 2019-09-19 (×2): qty 1

## 2019-09-19 MED ORDER — TRAMADOL HCL 50 MG PO TABS
50.0000 mg | ORAL_TABLET | Freq: Four times a day (QID) | ORAL | Status: DC | PRN
  Administered 2019-09-20 – 2019-09-25 (×8): 50 mg via ORAL
  Filled 2019-09-19 (×8): qty 1

## 2019-09-19 MED ORDER — DOCUSATE SODIUM 100 MG PO CAPS
100.0000 mg | ORAL_CAPSULE | Freq: Two times a day (BID) | ORAL | Status: DC
  Administered 2019-09-19 – 2019-09-25 (×12): 100 mg via ORAL
  Filled 2019-09-19 (×12): qty 1

## 2019-09-19 MED ORDER — ASPIRIN EC 81 MG PO TBEC
81.0000 mg | DELAYED_RELEASE_TABLET | Freq: Every day | ORAL | Status: DC
  Administered 2019-09-20 – 2019-09-25 (×6): 81 mg via ORAL
  Filled 2019-09-19 (×6): qty 1

## 2019-09-19 MED ORDER — SODIUM CHLORIDE 0.9 % IV SOLN
1.0000 g | Freq: Once | INTRAVENOUS | Status: AC
  Administered 2019-09-19: 17:00:00 1 g via INTRAVENOUS
  Filled 2019-09-19: qty 10

## 2019-09-19 MED ORDER — FUROSEMIDE 10 MG/ML IJ SOLN
20.0000 mg | Freq: Two times a day (BID) | INTRAMUSCULAR | Status: AC
  Administered 2019-09-19 – 2019-09-20 (×2): 20 mg via INTRAVENOUS
  Filled 2019-09-19 (×2): qty 2

## 2019-09-19 MED ORDER — SODIUM CHLORIDE 0.9% FLUSH: 3.0000 mL | INTRAVENOUS | Status: DC | PRN

## 2019-09-19 MED ORDER — ACETAMINOPHEN 650 MG RE SUPP: 650.0000 mg | Freq: Four times a day (QID) | RECTAL | Status: DC | PRN

## 2019-09-19 MED ORDER — ACETAMINOPHEN 325 MG PO TABS
650.0000 mg | ORAL_TABLET | Freq: Four times a day (QID) | ORAL | Status: DC | PRN
  Administered 2019-09-20 – 2019-09-25 (×3): 650 mg via ORAL
  Filled 2019-09-19 (×3): qty 2

## 2019-09-19 MED ORDER — VITAMIN B-12 1000 MCG PO TABS
1000.0000 ug | ORAL_TABLET | Freq: Every day | ORAL | Status: DC
  Administered 2019-09-20 – 2019-09-25 (×6): 1000 ug via ORAL
  Filled 2019-09-19 (×6): qty 1

## 2019-09-19 MED ORDER — SODIUM CHLORIDE 0.9% FLUSH
3.0000 mL | Freq: Two times a day (BID) | INTRAVENOUS | Status: DC
  Administered 2019-09-19 – 2019-09-25 (×10): 3 mL via INTRAVENOUS

## 2019-09-19 MED ORDER — DONEPEZIL HCL 10 MG PO TABS
10.0000 mg | ORAL_TABLET | Freq: Every day | ORAL | Status: DC
  Administered 2019-09-19 – 2019-09-24 (×6): 10 mg via ORAL
  Filled 2019-09-19 (×7): qty 1

## 2019-09-19 MED ORDER — SODIUM CHLORIDE 0.9 % IV SOLN
1.0000 g | INTRAVENOUS | Status: DC
  Filled 2019-09-19: qty 10

## 2019-09-19 MED ORDER — SODIUM CHLORIDE 0.9 % IV SOLN: 250.0000 mL | INTRAVENOUS | Status: DC | PRN

## 2019-09-19 MED ORDER — SODIUM CHLORIDE 0.9 % IV SOLN
Freq: Once | INTRAVENOUS | Status: AC
  Administered 2019-09-19: 17:00:00 via INTRAVENOUS

## 2019-09-19 NOTE — ED Notes (Signed)
Dinner Tray Ordered @ 1840.  

## 2019-09-19 NOTE — ED Notes (Signed)
92% on room air

## 2019-09-19 NOTE — ED Provider Notes (Addendum)
[facility] EMERGENCY DEPARTMENT Provider Note   CSN: [id number] Arrival date & time: 09/23/2019  1052     History   Chief Complaint Chief Complaint  Patient presents with   Shortness of Breath   Diarrhea    HPI Janet Reeves is a 85 y.o. female.     HPI Patient lives in assisted living.  She reports she has been getting more short of breath over the past 2 weeks.  She was mostly noticing it with exertion.  She reports now, over the past 3 days she has gotten significantly more short of breath.  She reports just minimal activity will have her feeling very winded.  She reports she has had a cough now for about 3 days is been dry.  She reports her central chest hurts sometimes when she coughs.  She is not otherwise having chest pain.  He has not any fever that she is aware of.  She describes chronic diarrhea.  She does not describe this as being new with current symptoms.  She has not had any vomiting.  No abdominal pain.  She reports she did note some symptoms of nasal congestion sore throat earlier in the illness.  EMS was called for shortness of breath and her oxygen saturations were in the high 80s.  Patient denies any regular use of home oxygen.  She has not had any swelling of the legs or pain in the calves. Past Medical History:  Diagnosis Date   Breast cancer ([place])    Mastectomy and chemotherapy   Diabetes mellitus ([place])    Diet controlled   GERD (gastroesophageal reflux disease)    Hyperlipidemia    Hypertension    Hypothyroid    Lymphoma [facility])    Chemotherapy    Patient Active Problem List   Diagnosis Date Noted   Syncope 10/19/2016   Chronic sciatica of right side 06/03/2015   Medication management 03/29/2014   GERD 03/29/2014   Hyperlipidemia 10/01/2013   Prediabetes 10/01/2013   Vitamin D deficiency 10/01/2013   Hypothyroidism 10/01/2013   Breast cancer ([place]) 10/01/2013   Lymphoma ([place]) 10/01/2013   Essential hypertension  09/30/2013    Past Surgical History:  Procedure Laterality Date   APPENDECTOMY     CHOLECYSTECTOMY     MASTECTOMY     TONSILLECTOMY       OB History   No obstetric history on file.      Home Medications    Prior to Admission medications   Medication Sig Start Date End Date Taking? Authorizing Provider  acetaminophen (TYLENOL) 500 MG tablet Take 1,000 mg by mouth 3 (three) times daily.    [provider]  aspirin 81 MG tablet Take 81 mg by mouth daily.    [provider]  Cholecalciferol (VITAMIN D3) 5000 UNITS CAPS Take 5,000 Units by mouth daily.    [provider]  citalopram (CELEXA) 20 MG tablet Take 1 tablet daily for depression Patient taking differently: Take 40 mg by mouth daily. Take 1 tablet daily for depression 06/30/15 10/19/16  Unk Pinto, MD  donepezil (ARICEPT) 10 MG tablet Take 10 mg by mouth at bedtime.    [provider]  levothyroxine (SYNTHROID) 150 MCG tablet Take 1 tablet (150 mcg total) by mouth daily. Patient taking differently: Take 75 mcg by mouth 2 (two) times daily.  01/23/15 10/19/16  Rolene Course, PA-C  levothyroxine (SYNTHROID, LEVOTHROID) 50 MCG tablet  11/16/16   [provider]  levothyroxine (SYNTHROID, [place])  75 MCG tablet  12/06/16   [provider]  mirtazapine (REMERON) 15 MG tablet Take 15 mg by mouth at bedtime.    [provider]  Omega-3 Fatty Acids (FISH OIL) 1000 MG CAPS Take 1,000 mg by mouth daily.     [provider]  sulfamethoxazole-trimethoprim (BACTRIM DS,SEPTRA DS) 800-160 MG tablet Take 1 tablet by mouth every 12 (twelve) hours. 10/20/16   Holley Raring, MD  traMADol (ULTRAM) 50 MG tablet Take by mouth every 6 (six) hours as needed.    [provider]  vitamin B-12 (CYANOCOBALAMIN) 1000 MCG tablet Take 1,000 mcg by mouth daily.    [provider]    Family History Family History  Problem Relation Age of Onset   Heart  disease Father        Rhythm disturbance   Hypertension Father    Cancer Mother    Cancer Sister    Hypertension Daughter    Hyperlipidemia Daughter    Hypertension Sister    Stroke Sister     Social History Social History   Tobacco Use   Smoking status: Never Smoker   Smokeless tobacco: Never Used  Substance Use Topics   Alcohol use: No   Drug use: No     Allergies   Codeine and Macrobid [nitrofurantoin macrocrystal]   Review of Systems Review of Systems 10 Systems reviewed and are negative for acute change except as noted in the HPI.  Physical Exam Updated Vital Signs BP (!) 145/93    Pulse (!) 103    Temp 98 F (36.7 C) (Oral)    Resp (!) 26    SpO2 97%   Physical Exam Constitutional:      Comments: Patient is alert with clear mental status.  Mild increased work of breathing at rest.  Patient is speaking in full sentences appropriately.  HENT:     Head: Normocephalic and atraumatic.  Eyes:     Extraocular Movements: Extraocular movements intact.     Conjunctiva/sclera: Conjunctivae normal.  Neck:     Musculoskeletal: Neck supple.  Cardiovascular:     Comments: Borderline tachycardia.  No gross rub murmur gallop. Pulmonary:     Comments: Mild tachypnea.  Lungs are grossly clear. Abdominal:     General: There is no distension.     Palpations: Abdomen is soft.     Tenderness: There is no abdominal tenderness. There is no guarding.  Musculoskeletal: Normal range of motion.        General: No swelling or tenderness.     Right lower leg: No edema.     Left lower leg: No edema.     Comments: Skin condition of the lower extremities is good.  No wounds and no significant edema.  Calves and popliteal fossae are nontender.  Distal pulses are 1+ and symmetric of the dorsalis pedis.  Skin:    General: Skin is warm and dry.  Neurological:     General: No focal deficit present.     Mental Status: She is oriented to person, place, and time.      Coordination: Coordination normal.  Psychiatric:        Mood and Affect: Mood normal.      ED Treatments / Results  Labs (all labs ordered are listed, but only abnormal results are displayed) Labs Reviewed  CBC WITH DIFFERENTIAL/PLATELET - Abnormal; Notable for the following components:      Result Value   WBC 11.8 (*)    Neutro Abs 10.2 (*)  All other components within normal limits  LACTIC ACID, PLASMA - Abnormal; Notable for the following components:   Lactic Acid, Venous 2.4 (*)    All other components within normal limits  COMPREHENSIVE METABOLIC PANEL - Abnormal; Notable for the following components:   CO2 18 (*)    Glucose, Bld 157 (*)    Creatinine, Ser 1.20 (*)    Albumin 3.3 (*)    GFR calc non Af Amer 40 (*)    GFR calc Af Amer 47 (*)    Anion gap 17 (*)    All other components within normal limits  BRAIN NATRIURETIC PEPTIDE - Abnormal; Notable for the following components:   B Natriuretic Peptide 107.2 (*)    All other components within normal limits  TROPONIN I (HIGH SENSITIVITY) - Abnormal; Notable for the following components:   Troponin I (High Sensitivity) 29 (*)    All other components within normal limits  TROPONIN I (HIGH SENSITIVITY) - Abnormal; Notable for the following components:   Troponin I (High Sensitivity) 30 (*)    All other components within normal limits  SARS CORONAVIRUS 2 (TAT 6-24 HRS)  CULTURE, BLOOD (ROUTINE X 2)  CULTURE, BLOOD (ROUTINE X 2)  RESPIRATORY PANEL BY PCR  LACTIC ACID, PLASMA  URINALYSIS, ROUTINE W REFLEX MICROSCOPIC  FERRITIN  TRIGLYCERIDES  C-REACTIVE PROTEIN  D-DIMER, QUANTITATIVE (NOT AT [facility])  FIBRINOGEN  LACTATE DEHYDROGENASE  PROCALCITONIN    EKG EKG Interpretation  Date/Time:  Wednesday September 19 2019 11:15:07 EST Ventricular Rate:  123 PR Interval:  172 QRS Duration: 102 QT Interval:  298 QTC Calculation: 426 R Axis:   -41 Text Interpretation: Sinus tachycardia with Premature atrial complexes  Left axis deviation Incomplete right bundle branch block Nonspecific T wave abnormality Abnormal ECG agree, QRS morphology unchanged from previous no acute ischemic changes compared to previuos Confirmed by Charlesetta Shanks [phone]) on 10/06/2019 3:02:07 PM   Radiology Dg Chest Portable 1 View  Result Date: 09/20/2019 CLINICAL DATA:  Hypoxia EXAM: PORTABLE CHEST 1 VIEW COMPARISON:  08/20/2014 FINDINGS: The heart size is enlarged. There are scattered bilateral airspace opacities, greatest within the perihilar distribution. There is a left lower lobe airspace opacity with a probable left-sided pleural effusion. There is no pneumothorax. There is no acute osseous abnormality. IMPRESSION: 1. Scattered bilateral airspace opacities, greatest within a perihilar distribution. Differential considerations include pulmonary edema or an atypical infectious process. Follow-up to resolution is recommended as an underlying pulmonary nodule cannot be excluded. 2. More focal consolidation is noted at the left lung base. 3. Probable small left-sided pleural effusion. 4. Cardiomegaly. Electronically Signed   By: Constance Holster M.D.   On: 09/25/2019 15:15    Procedures Procedures (including critical care time) CRITICAL CARE Performed by: Charlesetta Shanks   Total critical care time: 30  minutes  Critical care time was exclusive of separately billable procedures and treating other patients.  Critical care was necessary to treat or prevent imminent or life-threatening deterioration.  Critical care was time spent personally by me on the following activities: development of treatment plan with patient and/or surrogate as well as nursing, discussions with consultants, evaluation of patient's response to treatment, examination of patient, obtaining history from patient or surrogate, ordering and performing treatments and interventions, ordering and review of laboratory studies, ordering and review of radiographic  studies, pulse oximetry and re-evaluation of patient's condition. Medications Ordered in ED Medications  cefTRIAXone (ROCEPHIN) 1 g in sodium chloride 0.9 % 100 mL IVPB (0 g Intravenous Stopped  09/10/2019 1728)  azithromycin (ZITHROMAX) 500 mg in sodium chloride 0.9 % 250 mL IVPB (0 mg Intravenous Stopped 09/28/2019 1800)  0.9 %  sodium chloride infusion ( Intravenous New Bag/Given 09/24/2019 1646)  sodium chloride 0.9 % bolus 250 mL (0 mLs Intravenous Stopped 09/21/2019 1801)     Initial Impression / Assessment and Plan / ED Course  I have reviewed the triage vital signs and the nursing notes.  Pertinent labs & imaging results that were available during my care of the patient were reviewed by me and considered in my medical decision making (see chart for details).  Clinical Course as of Sep 19 1811  Wed Sep 19, 2019  1812 Consult: [facility] hospitalist has returned page for admission.   [MP]    Clinical Course User Index [MP] Charlesetta Shanks, MD      Patient hypoxic to the high 80s on room air.  No baseline oxygen use.  Patient has patchy bilateral consolidations on chest x-ray.  Patient has a mild leukocytosis.  Mild lactic acidosis.  Differential includes bacterial pneumonia as well as possible Covid.  With lactic acidosis, focal consolidations on chest x-ray and leukocytosis will initiate antibiotic coverage for bacterial pneumonia.  Covid testing will be pending.  Patient's mental status is clear however she does have hypoxia and generalized weakness with dyspnea.  We will plan for admission.  Final Clinical Impressions(s) / ED Diagnoses   Final diagnoses:  None    ED Discharge Orders    None       Charlesetta Shanks, MD 09/09/2019 1622    Charlesetta Shanks, MD 09/18/2019 1626    Charlesetta Shanks, MD 09/13/2019 1812

## 2019-09-19 NOTE — ED Notes (Signed)
Pt on 2L [place].  

## 2019-09-19 NOTE — ED Notes (Signed)
Pt given decaf coffee and [place] sandwich bag
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
## 2019-09-19 NOTE — ED Triage Notes (Signed)
Pt arrives via EMS from [facility] with SOB, diarrhea for last 3 days. Denies recent fever. VSS. Pt RA sat 87%. Now 98% on 2L. CBG 123, EKG ST. Alert, oriented x4.

## 2019-09-19 NOTE — ED Notes (Signed)
Admitting provider at bedside.

## 2019-09-19 NOTE — ED Provider Notes (Signed)
Patient placed in Quick Look pathway, seen and evaluated   Chief Complaint:   Generalized aches, SOB, Diarrhea x 3 days.  HPI:  EMS from [facility] endorses worsening symptoms x 3 days. Generalized body aches, SOB described as trouble catching breath only when coughing, occasional chest pain, nonbloody diarrhea. EMS noted patient to be hypoxic at 87% room air and placed on 2L [place].  ROS: + Body aches, SOB, cough, Diarrhea, CP.          - Fever/chills, headache, active cp, hemopytysis, extremity pain/swelling, abd pain, n/v, fall/injury sick contacts.  Physical Exam:   Gen: No distress  Neuro: Awake and Alert  Skin: Warm    Focused Exam: Cranial nerves intact, HRRR, LCTAB, Abd with mild generalized tenderness, NVI x 4 without sign of DVT.   Initiation of care has begun. The patient has been counseled on the process, plan, and necessity for staying for the completion/evaluation, and the remainder of the medical screening examination    Janet Reeves 09/16/2019 [place], [place], MD 09/23/19 1824

## 2019-09-20 ENCOUNTER — Inpatient Hospital Stay (HOSPITAL_COMMUNITY): Payer: Medicare Other

## 2019-09-20 ENCOUNTER — Other Ambulatory Visit (HOSPITAL_COMMUNITY): Payer: Medicare Other

## 2019-09-20 ENCOUNTER — Encounter (HOSPITAL_COMMUNITY): Payer: Self-pay

## 2019-09-20 DIAGNOSIS — E039 Hypothyroidism, unspecified: Secondary | ICD-10-CM

## 2019-09-20 DIAGNOSIS — J189 Pneumonia, unspecified organism: Secondary | ICD-10-CM

## 2019-09-20 DIAGNOSIS — I509 Heart failure, unspecified: Secondary | ICD-10-CM

## 2019-09-20 DIAGNOSIS — I340 Nonrheumatic mitral (valve) insufficiency: Secondary | ICD-10-CM

## 2019-09-20 DIAGNOSIS — I350 Nonrheumatic aortic (valve) stenosis: Secondary | ICD-10-CM

## 2019-09-20 DIAGNOSIS — I10 Essential (primary) hypertension: Secondary | ICD-10-CM

## 2019-09-20 LAB — ECHOCARDIOGRAM COMPLETE
Height: 66.5 in
Weight: 2014.12 oz

## 2019-09-20 LAB — TRIGLYCERIDES: Triglycerides: 160 mg/dL — ABNORMAL HIGH (ref <150)

## 2019-09-20 LAB — PROCALCITONIN: Procalcitonin: <0.1 ng/mL

## 2019-09-20 LAB — MAGNESIUM: Magnesium: 1.2 mg/dL — ABNORMAL LOW (ref 1.7–2.4)

## 2019-09-20 MED ORDER — SODIUM CHLORIDE 0.9 % IV SOLN
1.0000 g | INTRAVENOUS | Status: DC
  Administered 2019-09-20 – 2019-09-24 (×5): 1 g via INTRAVENOUS
  Filled 2019-09-20 (×4): qty 1
  Filled 2019-09-20 (×2): qty 10

## 2019-09-20 MED ORDER — MAGNESIUM SULFATE 2 GM/50ML IV SOLN
2.0000 g | Freq: Once | INTRAVENOUS | Status: AC
  Administered 2019-09-20: 2 g via INTRAVENOUS
  Filled 2019-09-20: qty 50

## 2019-09-20 MED ORDER — SODIUM CHLORIDE 0.9 % IV SOLN
500.0000 mg | INTRAVENOUS | Status: DC
  Administered 2019-09-20 – 2019-09-21 (×2): 500 mg via INTRAVENOUS
  Filled 2019-09-20 (×3): qty 500

## 2019-09-20 NOTE — Evaluation (Signed)
Physical Therapy Evaluation Patient Details Name: Janet Reeves MRN: [id number] DOB: [birth year] Today's Date: 09/20/2019   History of Present Illness  85 y.o. female with a history of hypertension, hypothyroidism, breast cancer, dementia. Patient presented secondary to shortness of breath Admitted 10/01/2019 with  concern for pneumonia vs heart failure.  Clinical Impression  PTA pt resident of [facility], where she ambulated with RW and supervision, and able to dress herself however requires assist with bathing and facility provides meals. Pt is currently limited in safe mobility by increased O2 demand, and dizziness in standing in presence of decreased strength and endurance. Pt is supervision to come to the EoB, maxAx2 in attempt to stand where she became dizzy and requires total assist to return to bed. PT is recommending SNF level rehab at discharge. PT will continue to follow acutely.    Follow Up Recommendations SNF    Equipment Recommendations  None recommended by PT       Precautions / Restrictions Precautions Precautions: Fall Restrictions Weight Bearing Restrictions: No      Mobility  Bed Mobility Overal bed mobility: Needs Assistance Bed Mobility: Supine to Sit;Sit to Supine     Supine to sit: Min guard Sit to supine: Total assist   General bed mobility comments: min guard for safety, pt requires increased time and effort and use of the bed rail, increased difficulty with scooting to EoB. total A to return to bed due to pt dizziness  Transfers Overall transfer level: Needs assistance Equipment used: Rolling walker (2 wheeled) Transfers: Sit to/from Stand Sit to Stand: Max assist;+2 physical assistance         General transfer comment: maxAx2 for power up, unable to achieve fully upright due to increased dizziness with positional change,         Balance Overall balance assessment: Needs assistance Sitting-balance support: Feet supported;No upper  extremity supported Sitting balance-Leahy Scale: Fair     Standing balance support: Bilateral upper extremity supported Standing balance-Leahy Scale: Zero                               Pertinent Vitals/Pain Pain Assessment: Faces Faces Pain Scale: Hurts a little bit Pain Location: generalized with movement Pain Descriptors / Indicators: Grimacing;Moaning Pain Intervention(s): Limited activity within patient's tolerance;Monitored during session;Repositioned    Home Living Family/patient expects to be discharged to:: Assisted living               Home Equipment: Latina Craver - 2 wheels;Shower seat;Grab bars - tub/shower;Grab bars - toilet      Prior Function Level of Independence: Needs assistance   Gait / Transfers Assistance Needed: ambulates with RW and at least supervision  ADL's / Homemaking Assistance Needed: facility provides meals and pt requires assist for bathing, able to dress herself           Extremity/Trunk Assessment   Upper Extremity Assessment Upper Extremity Assessment: Generalized weakness    Lower Extremity Assessment Lower Extremity Assessment: Generalized weakness       Communication   Communication: HOH  Cognition Arousal/Alertness: Awake/alert Behavior During Therapy: WFL for tasks assessed/performed Overall Cognitive Status: Impaired/Different from baseline Area of Impairment: Problem solving                             Problem Solving: Requires verbal cues;Requires tactile cues;Slow processing General Comments: processing a little slow  however answers questions appropriately      General Comments General comments (skin integrity, edema, etc.): at rest HR 83 bpm SaO2 on 2L O2 via [place] 96%O2, Pt unable to achieve fully upright due to dizziness, with retunr to sitting pt slouched over on side of bed not answering questions, returned to supine and pt able to answer questions BPT 136/83, SaO2 90%O2, HR 96  bpm        Assessment/Plan    PT Assessment Patient needs continued PT services  PT Problem List Decreased strength;Decreased activity tolerance;Decreased mobility;Cardiopulmonary status limiting activity       PT Treatment Interventions DME instruction;Gait training;Functional mobility training;Therapeutic activities;Therapeutic exercise;Balance training;Cognitive remediation;Patient/family education    PT Goals (Current goals can be found in the Care Plan section)  Acute Rehab PT Goals Patient Stated Goal: go walking outside PT Goal Formulation: With patient Time For Goal Achievement: 10/04/19 Potential to Achieve Goals: Good    Frequency Min 2X/week    AM-PAC PT "6 Clicks" Mobility  Outcome Measure Help needed turning from your back to your side while in a flat bed without using bedrails?: None Help needed moving from lying on your back to sitting on the side of a flat bed without using bedrails?: None Help needed moving to and from a bed to a chair (including a wheelchair)?: Total Help needed standing up from a chair using your arms (e.g., wheelchair or bedside chair)?: Total Help needed to walk in hospital room?: Total Help needed climbing 3-5 steps with a railing? : Total 6 Click Score: 12    End of Session Equipment Utilized During Treatment: Gait belt;Oxygen Activity Tolerance: Treatment limited secondary to medical complications (Comment)(dizziness in standing) Patient left: in bed;with call bell/phone within reach;with bed alarm set;Other (comment)(ECHO in room) Nurse Communication: Mobility status PT Visit Diagnosis: Unsteadiness on feet (R26.81);Other abnormalities of gait and mobility (R26.89);Muscle weakness (generalized) (M62.81);Difficulty in walking, not elsewhere classified (R26.2)    Time: [id number] PT Time Calculation (min) (ACUTE ONLY): 22 min   Charges:   PT Evaluation $PT Eval Moderate Complexity: 1 Mod          Hailei Besser B. Migdalia Dk PT,  DPT Acute Rehabilitation Services Pager [phone] Office [phone]   [place] 09/20/2019, 5:10 PM

## 2019-09-20 NOTE — Progress Notes (Signed)
  Echocardiogram 2D Echocardiogram has been performed.  Janet Reeves 09/20/2019, 5:26 PM

## 2019-09-20 NOTE — Progress Notes (Signed)
PROGRESS NOTE    Janet Reeves  [id number] DOB: 02-Aug-1929 DOA: 09/24/2019 PCP: Jacklyn Shell, FNP   Brief Narrative: Janet Reeves is a 85 y.o. female with a history of hypertension, hypothyroidism, breast cancer, dementia. Patient presented secondary to shortness of breath with initial concern for pneumonia vs heart failure.   Assessment & Plan:   Active Problems:   Essential hypertension   Hypothyroidism   Breast cancer ([place])   Healthcare-associated pneumonia   Acute CHF (congestive heart failure) (HCC)   Pneumonia   Acute heart failure Unknown type. Improved with lasix. -Continue Lasix -Transthoracic Echocardiogram -PT eval -Strict in/out and daily weights  ?HCAP Possible diagnosis. Diagnosed on admission. Started on empiric Ceftriaxone and azithromycin. Procalcitonin undetectable. -Continue Ceftriaxone and Azithromycin  Essential hypertension Well controlled. Not on medication  Hypothyroidism -Continue synthroid  History of breast cancer Outpatient follow-up with oncology/palliative care  Dementia -Continue Aricept  Positive blood culture 2/4 positive for GPC -Continue Ceftriaxone and await further culture data   DVT prophylaxis: Lovenox Code Status:   Code Status: DNR Family Communication: Daughter on telephone Disposition Plan: Discharge possibly back to ALF vs higher level of care pending continued treatment and PT evaluation   Consultants:   None  Procedures:   None  Antimicrobials:  Ceftriaxone  Azithromycin    Subjective: Feeling much better but not at baseline.  Objective: Vitals:   09/22/2019 2100 09/22/2019 2227 09/12/2019 2231 09/20/19 0812  BP: (!) 130/92  128/89 115/75  Pulse: (!) 117  (!) 106 89  Resp: (!) 25  20   Temp:   97.8 F (36.6 C) 97.7 F (36.5 C)  TempSrc:    Oral  SpO2: 97%  96% 93%  Weight:  57.1 kg    Height:  5' 6.5" (1.689 m)      Intake/Output Summary (Last 24 hours) at 09/20/2019 1417 Last data  filed at 09/20/2019 1000 Gross per 24 hour  Intake 840 ml  Output 300 ml  Net 540 ml   Filed Weights   09/22/2019 2227  Weight: 57.1 kg    Examination:  General exam: Appears calm and comfortable Respiratory system: Diminished. Respiratory effort normal. Cardiovascular system: S1 & S2 heard, RRR. No murmurs, rubs, gallops or clicks. Gastrointestinal system: Abdomen is nondistended, soft and nontender. No organomegaly or masses felt. Normal bowel sounds heard. Central nervous system: Alert and oriented. No focal neurological deficits. Extremities: No significant edema. No calf tenderness. Tenderness over tibia Skin: No cyanosis. No rashes Psychiatry: Judgement and insight appear normal. Mood & affect appropriate.     Data Reviewed: I have personally reviewed following labs and imaging studies  CBC: Recent Labs  Lab 10/08/2019 1113  WBC 11.8*  NEUTROABS 10.2*  HGB 14.4  HCT 43.8  MCV 93.2  PLT PLATELET CLUMPS NOTED ON SMEAR, COUNT APPEARS ADEQUATE   Basic Metabolic Panel: Recent Labs  Lab 09/13/2019 1609  NA 138  K 4.3  CL 103  CO2 18*  GLUCOSE 157*  BUN 16  CREATININE 1.20*  CALCIUM 9.9   GFR: Estimated Creatinine Clearance: 29.2 mL/min (A) (by C-G formula based on SCr of 1.2 mg/dL (H)). Liver Function Tests: Recent Labs  Lab 09/30/2019 1609  AST 16  ALT 11  ALKPHOS 51  BILITOT 0.8  PROT 6.5  ALBUMIN 3.3*   No results for input(s): LIPASE, AMYLASE in the last 168 hours. No results for input(s): AMMONIA in the last 168 hours. Coagulation Profile: No results for input(s): INR, PROTIME in the last  168 hours. Cardiac Enzymes: No results for input(s): CKTOTAL, CKMB, CKMBINDEX, TROPONINI in the last 168 hours. BNP (last 3 results) No results for input(s): PROBNP in the last 8760 hours. HbA1C: No results for input(s): HGBA1C in the last 72 hours. CBG: No results for input(s): GLUCAP in the last 168 hours. Lipid Profile: Recent Labs    09/13/2019 2307  TRIG  160*   Thyroid Function Tests: No results for input(s): TSH, T4TOTAL, FREET4, T3FREE, THYROIDAB in the last 72 hours. Anemia Panel: Recent Labs    09/09/2019 1730  FERRITIN 61   Sepsis Labs: Recent Labs  Lab 09/10/2019 1114 09/11/2019 1609 10/03/2019 2256  PROCALCITON  --   --  <0.10  LATICACIDVEN 2.4* 1.9  --     Recent Results (from the past 240 hour(s))  SARS CORONAVIRUS 2 (TAT 6-24 HRS) Nasopharyngeal Nasopharyngeal Swab     Status: None   Collection Time: 10/06/2019  4:16 PM   Specimen: Nasopharyngeal Swab  Result Value Ref Range Status   SARS Coronavirus 2 NEGATIVE NEGATIVE Final    Comment: (NOTE) SARS-CoV-2 target nucleic acids are NOT DETECTED. The SARS-CoV-2 RNA is generally detectable in upper and lower respiratory specimens during the acute phase of infection. Negative results do not preclude SARS-CoV-2 infection, do not rule out co-infections with other pathogens, and should not be used as the sole basis for treatment or other patient management decisions. Negative results must be combined with clinical observations, patient history, and epidemiological information. The expected result is Negative. Fact Sheet for Patients: SugarRoll.be Fact Sheet for Healthcare Providers: https://www.woods-mathews.com/ This test is not yet approved or cleared by the [place] FDA and  has been authorized for detection and/or diagnosis of SARS-CoV-2 by FDA under an Emergency Use Authorization (EUA). This EUA will remain  in effect (meaning this test can be used) for the duration of the COVID-19 declaration under Section 56 4(b)(1) of the Act, 21 U.S.C. section 360bbb-3(b)(1), unless the authorization is terminated or revoked sooner. Performed at [facility], [place] [address] [place], [place] [address]   Respiratory Panel by PCR     Status: None   Collection Time: 09/25/2019  4:43 PM   Specimen: Nasopharyngeal Swab; Respiratory   Result Value Ref Range Status   Adenovirus NOT DETECTED NOT DETECTED Final   Coronavirus 229E NOT DETECTED NOT DETECTED Final    Comment: (NOTE) The Coronavirus on the Respiratory Panel, DOES NOT test for the novel  Coronavirus (2019 nCoV)    Coronavirus HKU1 NOT DETECTED NOT DETECTED Final   Coronavirus NL63 NOT DETECTED NOT DETECTED Final   Coronavirus OC43 NOT DETECTED NOT DETECTED Final   Metapneumovirus NOT DETECTED NOT DETECTED Final   Rhinovirus / Enterovirus NOT DETECTED NOT DETECTED Final   Influenza A NOT DETECTED NOT DETECTED Final   Influenza B NOT DETECTED NOT DETECTED Final   Parainfluenza Virus 1 NOT DETECTED NOT DETECTED Final   Parainfluenza Virus 2 NOT DETECTED NOT DETECTED Final   Parainfluenza Virus 3 NOT DETECTED NOT DETECTED Final   Parainfluenza Virus 4 NOT DETECTED NOT DETECTED Final   Respiratory Syncytial Virus NOT DETECTED NOT DETECTED Final   Bordetella pertussis NOT DETECTED NOT DETECTED Final   Chlamydophila pneumoniae NOT DETECTED NOT DETECTED Final   Mycoplasma pneumoniae NOT DETECTED NOT DETECTED Final    Comment: Performed at [facility], [place]. [address], [place],  [address]  Blood culture (routine x 2)     Status: None (Preliminary result)   Collection  Time: 09/11/2019  4:52 PM   Specimen: BLOOD LEFT FOREARM  Result Value Ref Range Status   Specimen Description BLOOD LEFT FOREARM  Final   Special Requests   Final    BOTTLES DRAWN AEROBIC AND ANAEROBIC Blood Culture adequate volume   Culture   Final    NO GROWTH < 24 HOURS Performed at [facility], [address] [place], [place] [address]    Report Status PENDING  Incomplete  Blood culture (routine x 2)     Status: None (Preliminary result)   Collection Time: 09/28/2019  4:52 PM   Specimen: BLOOD  Result Value Ref Range Status   Specimen Description BLOOD LEFT ANTECUBITAL  Final   Special Requests   Final    BOTTLES DRAWN AEROBIC AND ANAEROBIC Blood Culture adequate volume    Culture  Setup Time   Final    GRAM POSITIVE COCCI IN BOTH AEROBIC AND ANAEROBIC BOTTLES CRITICAL RESULT CALLED TO, READ BACK BY AND VERIFIED WITH: PHARM J FRENS [id number] AT 847 AM BY CM Performed at [facility], [place] [address] [place], [place] [address]    Culture GRAM POSITIVE COCCI  Final   Report Status PENDING  Incomplete         Radiology Studies: Dg Chest 2 View  Result Date: 09/20/2019 CLINICAL DATA:  Pneumonia.  CHF. EXAM: CHEST - 2 VIEW COMPARISON:  07/20/2019.  02/27/2013. FINDINGS: Stable cardiomegaly. Diffuse multifocal bilateral pulmonary infiltrates again noted. Infiltrates are slightly increased in the upper lobes day's exam small left pleural effusion. Prominence sliding hiatal hernia again noted. Stable lower thoracic vertebral body compression fracture. Surgical clips right axilla. IMPRESSION: 1. Diffuse multifocal bilateral pulmonary infiltrates again noted pulmonary infiltrates have slightly increased in the upper lobes on today's exam. 2. Stable cardiomegaly. 3. Prominent hiatal hernia again noted. Electronically Signed   By: Marcello Moores  Register   On: 09/20/2019 14:08   Dg Chest Portable 1 View  Result Date: 09/22/2019 CLINICAL DATA:  Hypoxia EXAM: PORTABLE CHEST 1 VIEW COMPARISON:  08/20/2014 FINDINGS: The heart size is enlarged. There are scattered bilateral airspace opacities, greatest within the perihilar distribution. There is a left lower lobe airspace opacity with a probable left-sided pleural effusion. There is no pneumothorax. There is no acute osseous abnormality. IMPRESSION: 1. Scattered bilateral airspace opacities, greatest within a perihilar distribution. Differential considerations include pulmonary edema or an atypical infectious process. Follow-up to resolution is recommended as an underlying pulmonary nodule cannot be excluded. 2. More focal consolidation is noted at the left lung base. 3. Probable small left-sided pleural effusion. 4. Cardiomegaly.  Electronically Signed   By: Constance Holster M.D.   On: 09/13/2019 15:15        Scheduled Meds: . aspirin EC  81 mg Oral Daily  . azithromycin  250 mg Oral Q1400  . docusate sodium  100 mg Oral BID  . donepezil  10 mg Oral QHS  . enoxaparin (LOVENOX) injection  30 mg Subcutaneous QHS  . levothyroxine  112 mcg Oral Q0600  . sodium chloride flush  3 mL Intravenous Q12H  . venlafaxine XR  75 mg Oral Q breakfast  . vitamin B-12  1,000 mcg Oral Daily   Continuous Infusions: . sodium chloride    . cefTRIAXone (ROCEPHIN)  IV       LOS: 1 day     Cordelia Poche, MD [facility] 09/20/2019, 2:17 PM  If 7PM-7AM, please contact night-coverage [email or web address]

## 2019-09-20 NOTE — Progress Notes (Signed)
PHARMACY - PHYSICIAN COMMUNICATION CRITICAL VALUE ALERT - BLOOD CULTURE IDENTIFICATION (BCID)  Addaline PINKY GULLAGE is an 85 y.o. female who presented to [facility] on 10/08/2019 with a chief complaint of shortness of breath  Assessment:  Admitted to the hospital for pneumonia treatment.  1 blood culture positive for GPC, possible contamination Name of physician (or Provider) Contacted: Dr. Ewing Schlein  Current antibiotics: Ceftriaxone + azithromycin  Changes to prescribed antibiotics recommended: none at this time   No results found for this or any previous visit.  Candie Mile 09/20/2019  8:53 AM

## 2019-09-21 ENCOUNTER — Encounter (HOSPITAL_COMMUNITY): Payer: Self-pay

## 2019-09-21 ENCOUNTER — Inpatient Hospital Stay (HOSPITAL_COMMUNITY): Payer: Medicare Other

## 2019-09-21 LAB — CBC
HCT: 40.5 % (ref 36.0–46.0)
Hemoglobin: 13.3 g/dL (ref 12.0–15.0)
MCH: 30.6 pg (ref 26.0–34.0)
MCHC: 32.8 g/dL (ref 30.0–36.0)
MCV: 93.1 fL (ref 80.0–100.0)
Platelets: 321 10*3/uL (ref 150–400)
RBC: 4.35 MIL/uL (ref 3.87–5.11)
RDW: 13.8 % (ref 11.5–15.5)
WBC: 13 10*3/uL — ABNORMAL HIGH (ref 4.0–10.5)
nRBC: 0 % (ref 0.0–0.2)

## 2019-09-21 LAB — BASIC METABOLIC PANEL
Anion gap: 14 (ref 5–15)
BUN: 13 mg/dL (ref 8–23)
CO2: 23 mmol/L (ref 22–32)
Calcium: 9.3 mg/dL (ref 8.9–10.3)
Chloride: 106 mmol/L (ref 98–111)
Creatinine, Ser: 0.98 mg/dL (ref 0.44–1.00)
GFR calc Af Amer: 60 mL/min — ABNORMAL LOW (ref >60)
GFR calc non Af Amer: 52 mL/min — ABNORMAL LOW (ref >60)
Glucose, Bld: 101 mg/dL — ABNORMAL HIGH (ref 70–99)
Potassium: 3.9 mmol/L (ref 3.5–5.1)
Sodium: 143 mmol/L (ref 135–145)

## 2019-09-21 LAB — MAGNESIUM: Magnesium: 1.6 mg/dL — ABNORMAL LOW (ref 1.7–2.4)

## 2019-09-21 LAB — TSH: TSH: 0.223 u[IU]/mL — ABNORMAL LOW (ref 0.350–4.500)

## 2019-09-21 MED ORDER — MAGNESIUM SULFATE 2 GM/50ML IV SOLN
2.0000 g | Freq: Once | INTRAVENOUS | Status: AC
  Administered 2019-09-21: 2 g via INTRAVENOUS
  Filled 2019-09-21: qty 50

## 2019-09-21 MED ORDER — LEVOTHYROXINE SODIUM 100 MCG PO TABS
100.0000 ug | ORAL_TABLET | Freq: Every day | ORAL | Status: DC
  Administered 2019-09-22 – 2019-09-25 (×4): 100 ug via ORAL
  Filled 2019-09-21 (×4): qty 1

## 2019-09-21 NOTE — Progress Notes (Signed)
PROGRESS NOTE    Janet Reeves  [id number] DOB: [birth year] DOA: 09/18/2019 PCP: Jacklyn Shell, FNP   Brief Narrative: Janet Reeves is a 85 y.o. female with a history of hypertension, hypothyroidism, breast cancer, dementia. Patient presented secondary to shortness of breath with initial concern for pneumonia vs heart failure.   Assessment & Plan:   Active Problems:   Essential hypertension   Hypothyroidism   Breast cancer (HCC)   Healthcare-associated pneumonia   Acute CHF (congestive heart failure) (HCC)   Pneumonia   Acute heart failure Transthoracic Echocardiogram not suggestive of systolic heart failure. No mention of diastolic function. With elevated BNP and chest x-ray significant for infiltrates and cardiomegaly, will assume she has some diastolic dysfunction. -Continue Lasix -PT recommendations: SNF -Strict in/out and daily weights  ?HCAP Possible diagnosis. Diagnosed on admission. Started on empiric Ceftriaxone and azithromycin. Procalcitonin undetectable. Wheezing on exam. Leukocytosis increased slightly. -Continue Ceftriaxone and Azithromycin  Atrial fibrillation Transient. TSH obtained today and was low. Confirmed Synthroid dose of 112 mcg from ALF. Magnesium low -Continue telemetry -Adjust Synthroid -Replete magnesium  Essential hypertension Well controlled. Not on medication  Hypothyroidism TSH low. Patient receiving Synthroid 112 mcg at ALF. Per review, dose increased from 100 mcg to 112 mcg on 05/19/2019. Most recent TSH from ALF was 7.51 on 07/05/2019. Numbers are slightly difficult to interpret, but since she has a low TSH at present with tachycardic episodes, will decrease Synthroid dose -Decrease to Synthroid 100 mcg daily  History of breast cancer Outpatient follow-up with oncology/palliative care  Dementia -Continue Aricept  Abdominal pain Patient reports generalized abdominal pain that improved with bowel movement. -Abdominal x-ray   Positive blood culture 2/4 positive for GPC -Continue Ceftriaxone and await further culture data   DVT prophylaxis: Lovenox Code Status:   Code Status: DNR Family Communication: None Disposition Plan: Discharge to SNF when medically stable for discharge (pending culture data, improvement of symptoms, wean to room air)   Consultants:   None  Procedures:   11/12: Transthoracic Echocardiogram IMPRESSIONS    1. There is moderate to severe mitral regurgitation present (likely severe). The mitral valve annulus and PMVL are heavily calcified consistent with degenerative mitral valve disease. The regurgitation is poorly interrogated on this study. The  regurgitant jet is posteriorly directed, reaching the pulmonary veins. Would consider a TEE for better quantification.  2. A mild to moderate degree of aortic stenosis is present. The Vmax is 2.1 m/s, and mean gradient 10.3 mmHG. AVA by continuity is 1.20 cm2. The DI is 0.47. The peak velocity and gradients are underestimated due to low SV (SV=42 cc; SVi=25 cc/m2). I  suspect at least moderate AS is present.  3. Left ventricular ejection fraction, by visual estimation, is 60 to 65%. The left ventricle has normal function. There is mildly increased left ventricular hypertrophy.  4. Left ventricular diastolic function could not be evaluated.  5. Global right ventricle has normal systolic function.The right ventricular size is normal. No increase in right ventricular wall thickness.  6. Left atrial size was severely dilated.  7. Right atrial size was normal.  8. Presence of pericardial fat pad.  9. Trivial pericardial effusion is present. 10. Moderate calcification of the mitral valve leaflet(s). 11. Moderate thickening of the mitral valve leaflet(s). 12. Moderate to severe mitral annular calcification. 13. The mitral valve is degenerative. Moderate to severe mitral valve regurgitation. 14. The tricuspid valve is grossly normal. Tricuspid  valve regurgitation is trivial. 15. The aortic valve is tricuspid.  Aortic valve regurgitation is not visualized. Mild to moderate aortic valve stenosis. 16. There is Severe calcifcation of the aortic valve. 17. There is Severely thickening of the aortic valve. 18. The pulmonic valve was grossly normal. Pulmonic valve regurgitation is not visualized. 19. Normal pulmonary artery systolic pressure. 20. The tricuspid regurgitant velocity is 2.51 m/s, and with an assumed right atrial pressure of 8 mmHg, the estimated right ventricular systolic pressure is normal at 33.2 mmHg. 21. The inferior vena cava is normal in size with <50% respiratory variability, suggesting right atrial pressure of 8 mmHg. 22. Small left ventricular internal cavity size.  Antimicrobials:  Ceftriaxone  Azithromycin    Subjective: Stomach pain. Last bowel movement this morning which helped symptoms somewhat.  Objective: Vitals:   09/20/19 0812 09/20/19 1700 09/21/19 0029 09/21/19 0724  BP: 115/75 102/67 106/69 111/62  Pulse: 89 84 (!) 105 87  Resp:  (!) 24 16 13   Temp: 97.7 F (36.5 C) 98.7 F (37.1 C) 97.6 F (36.4 C) 97.7 F (36.5 C)  TempSrc: Oral Oral    SpO2: 93% 96% 96% 95%  Weight:      Height:        Intake/Output Summary (Last 24 hours) at 09/21/2019 1128 Last data filed at 09/21/2019 0610 Gross per 24 hour  Intake 923.01 ml  Output 1100 ml  Net -176.99 ml   Filed Weights   09/12/2019 2227  Weight: 57.1 kg    Examination:  General exam: Appears calm and comfortable  Respiratory system: Mild wheezing bilaterally. Respiratory effort normal. Cardiovascular system: S1 & S2 heard, RRR. No murmurs, rubs, gallops or clicks. Gastrointestinal system: Abdomen is nondistended, soft and nontender. Ventral hernia that's non-tender and reduces. Normal bowel sounds heard. Central nervous system: Alert and oriented. No focal neurological deficits. Extremities: No edema. No calf tenderness Skin: No  cyanosis. No rashes Psychiatry: Judgement and insight appear normal. Mood & affect appropriate.     Data Reviewed: I have personally reviewed following labs and imaging studies  CBC: Recent Labs  Lab 09/24/2019 1113 09/21/19 0915  WBC 11.8* 13.0*  NEUTROABS 10.2*  --   HGB 14.4 13.3  HCT 43.8 40.5  MCV 93.2 93.1  PLT PLATELET CLUMPS NOTED ON SMEAR, COUNT APPEARS ADEQUATE [id number]   Basic Metabolic Panel: Recent Labs  Lab 10/01/2019 1609 09/20/19 1845 09/21/19 0347  NA 138  --  143  K 4.3  --  3.9  CL 103  --  106  CO2 18*  --  23  GLUCOSE 157*  --  101*  BUN 16  --  13  CREATININE 1.20*  --  0.98  CALCIUM 9.9  --  9.3  MG  --  1.2* 1.6*   GFR: Estimated Creatinine Clearance: 35.8 mL/min (by C-G formula based on SCr of 0.98 mg/dL). Liver Function Tests: Recent Labs  Lab 09/17/2019 1609  AST 16  ALT 11  ALKPHOS 51  BILITOT 0.8  PROT 6.5  ALBUMIN 3.3*   No results for input(s): LIPASE, AMYLASE in the last 168 hours. No results for input(s): AMMONIA in the last 168 hours. Coagulation Profile: No results for input(s): INR, PROTIME in the last 168 hours. Cardiac Enzymes: No results for input(s): CKTOTAL, CKMB, CKMBINDEX, TROPONINI in the last 168 hours. BNP (last 3 results) No results for input(s): PROBNP in the last 8760 hours. HbA1C: No results for input(s): HGBA1C in the last 72 hours. CBG: No results for input(s): GLUCAP in the last 168 hours. Lipid Profile: Recent Labs  09/23/2019 2307  TRIG 160*   Thyroid Function Tests: Recent Labs    09/21/19 0915  TSH 0.223*   Anemia Panel: Recent Labs    09/20/2019 1730  FERRITIN 61   Sepsis Labs: Recent Labs  Lab 09/25/2019 1114 09/22/2019 1609 09/25/2019 2256  PROCALCITON  --   --  <0.10  LATICACIDVEN 2.4* 1.9  --     Recent Results (from the past 240 hour(s))  SARS CORONAVIRUS 2 (TAT 6-24 HRS) Nasopharyngeal Nasopharyngeal Swab     Status: None   Collection Time: 10/06/2019  4:16 PM   Specimen:  Nasopharyngeal Swab  Result Value Ref Range Status   SARS Coronavirus 2 NEGATIVE NEGATIVE Final    Comment: (NOTE) SARS-CoV-2 target nucleic acids are NOT DETECTED. The SARS-CoV-2 RNA is generally detectable in upper and lower respiratory specimens during the acute phase of infection. Negative results do not preclude SARS-CoV-2 infection, do not rule out co-infections with other pathogens, and should not be used as the sole basis for treatment or other patient management decisions. Negative results must be combined with clinical observations, patient history, and epidemiological information. The expected result is Negative. Fact Sheet for Patients: SugarRoll.be Fact Sheet for Healthcare Providers: https://www.woods-mathews.com/ This test is not yet approved or cleared by the [place] FDA and  has been authorized for detection and/or diagnosis of SARS-CoV-2 by FDA under an Emergency Use Authorization (EUA). This EUA will remain  in effect (meaning this test can be used) for the duration of the COVID-19 declaration under Section 56 4(b)(1) of the Act, 21 U.S.C. section 360bbb-3(b)(1), unless the authorization is terminated or revoked sooner. Performed at [facility], [place] [address], [place], [place] [address]   Respiratory Panel by PCR     Status: None   Collection Time: 09/22/2019  4:43 PM   Specimen: Nasopharyngeal Swab; Respiratory  Result Value Ref Range Status   Adenovirus NOT DETECTED NOT DETECTED Final   Coronavirus 229E NOT DETECTED NOT DETECTED Final    Comment: (NOTE) The Coronavirus on the Respiratory Panel, DOES NOT test for the novel  Coronavirus (2019 nCoV)    Coronavirus HKU1 NOT DETECTED NOT DETECTED Final   Coronavirus NL63 NOT DETECTED NOT DETECTED Final   Coronavirus OC43 NOT DETECTED NOT DETECTED Final   Metapneumovirus NOT DETECTED NOT DETECTED Final   Rhinovirus / Enterovirus NOT DETECTED NOT DETECTED  Final   Influenza A NOT DETECTED NOT DETECTED Final   Influenza B NOT DETECTED NOT DETECTED Final   Parainfluenza Virus 1 NOT DETECTED NOT DETECTED Final   Parainfluenza Virus 2 NOT DETECTED NOT DETECTED Final   Parainfluenza Virus 3 NOT DETECTED NOT DETECTED Final   Parainfluenza Virus 4 NOT DETECTED NOT DETECTED Final   Respiratory Syncytial Virus NOT DETECTED NOT DETECTED Final   Bordetella pertussis NOT DETECTED NOT DETECTED Final   Chlamydophila pneumoniae NOT DETECTED NOT DETECTED Final   Mycoplasma pneumoniae NOT DETECTED NOT DETECTED Final    Comment: Performed at [facility], [place]. [address] [place], [place] [address]  Blood culture (routine x 2)     Status: None (Preliminary result)   Collection Time: 09/18/2019  4:52 PM   Specimen: BLOOD LEFT FOREARM  Result Value Ref Range Status   Specimen Description BLOOD LEFT FOREARM  Final   Special Requests   Final    BOTTLES DRAWN AEROBIC AND ANAEROBIC Blood Culture adequate volume   Culture   Final    NO GROWTH < 24 HOURS Performed at [facility], [place] [address], [place], [place] [address]    Report Status PENDING  Incomplete  Blood culture (routine x 2)     Status: None (Preliminary result)   Collection Time: 09/17/2019  4:52 PM   Specimen: BLOOD  Result Value Ref Range Status   Specimen Description BLOOD LEFT ANTECUBITAL  Final   Special Requests   Final    BOTTLES DRAWN AEROBIC AND ANAEROBIC Blood Culture adequate volume   Culture  Setup Time   Final    GRAM POSITIVE COCCI IN BOTH AEROBIC AND ANAEROBIC BOTTLES CRITICAL RESULT CALLED TO, READ BACK BY AND VERIFIED WITH: PHARM J FRENS [id number] AT 847 AM BY CM    Culture   Final    GRAM POSITIVE COCCI IDENTIFICATION TO FOLLOW CULTURE REINCUBATED FOR BETTER GROWTH Performed at [facility], [place] [address] [place], [place] [address]    Report Status PENDING  Incomplete         Radiology Studies: Dg Chest 2 View  Result Date: 09/20/2019 CLINICAL  DATA:  Pneumonia.  CHF. EXAM: CHEST - 2 VIEW COMPARISON:  07/20/2019.  02/27/2013. FINDINGS: Stable cardiomegaly. Diffuse multifocal bilateral pulmonary infiltrates again noted. Infiltrates are slightly increased in the upper lobes day's exam small left pleural effusion. Prominence sliding hiatal hernia again noted. Stable lower thoracic vertebral body compression fracture. Surgical clips right axilla. IMPRESSION: 1. Diffuse multifocal bilateral pulmonary infiltrates again noted pulmonary infiltrates have slightly increased in the upper lobes on today's exam. 2. Stable cardiomegaly. 3. Prominent hiatal hernia again noted. Electronically Signed   By: Marcello Moores  Register   On: 09/20/2019 14:08   Dg Chest Portable 1 View  Result Date: 10/08/2019 CLINICAL DATA:  Hypoxia EXAM: PORTABLE CHEST 1 VIEW COMPARISON:  08/20/2014 FINDINGS: The heart size is enlarged. There are scattered bilateral airspace opacities, greatest within the perihilar distribution. There is a left lower lobe airspace opacity with a probable left-sided pleural effusion. There is no pneumothorax. There is no acute osseous abnormality. IMPRESSION: 1. Scattered bilateral airspace opacities, greatest within a perihilar distribution. Differential considerations include pulmonary edema or an atypical infectious process. Follow-up to resolution is recommended as an underlying pulmonary nodule cannot be excluded. 2. More focal consolidation is noted at the left lung base. 3. Probable small left-sided pleural effusion. 4. Cardiomegaly. Electronically Signed   By: Constance Holster M.D.   On: 09/25/2019 15:15        Scheduled Meds: . aspirin EC  81 mg Oral Daily  . docusate sodium  100 mg Oral BID  . donepezil  10 mg Oral QHS  . enoxaparin (LOVENOX) injection  30 mg Subcutaneous QHS  . levothyroxine  112 mcg Oral Q0600  . sodium chloride flush  3 mL Intravenous Q12H  . venlafaxine XR  75 mg Oral Q breakfast  . vitamin B-12  1,000 mcg Oral Daily    Continuous Infusions: . sodium chloride    . azithromycin (ZITHROMAX) 500 MG IVPB (Vial-Mate [employer]) Stopped (09/21/19 0245)  . cefTRIAXone (ROCEPHIN)  IV Stopped (09/21/19 0158)     LOS: 2 days     Cordelia Poche, MD [facility] 09/21/2019, 11:28 AM  If 7PM-7AM, please contact night-coverage [email or web address]

## 2019-09-21 NOTE — Care Management Important Message (Signed)
Important Message  Patient Details  Name: Janet Reeves MRN: [id number] Date of Birth: 1929-01-20   Medicare Important Message Given:  Yes     Mykah Shin 09/21/2019, 2:27 PM

## 2019-09-22 ENCOUNTER — Inpatient Hospital Stay (HOSPITAL_COMMUNITY): Payer: Medicare Other

## 2019-09-22 LAB — BASIC METABOLIC PANEL
Anion gap: 16 — ABNORMAL HIGH (ref 5–15)
BUN: 13 mg/dL (ref 8–23)
CO2: 24 mmol/L (ref 22–32)
Calcium: 9.6 mg/dL (ref 8.9–10.3)
Chloride: 99 mmol/L (ref 98–111)
Creatinine, Ser: 1.17 mg/dL — ABNORMAL HIGH (ref 0.44–1.00)
GFR calc Af Amer: 48 mL/min — ABNORMAL LOW (ref >60)
GFR calc non Af Amer: 42 mL/min — ABNORMAL LOW (ref >60)
Glucose, Bld: 132 mg/dL — ABNORMAL HIGH (ref 70–99)
Potassium: 4.3 mmol/L (ref 3.5–5.1)
Sodium: 139 mmol/L (ref 135–145)

## 2019-09-22 LAB — CBC
HCT: 42.6 % (ref 36.0–46.0)
Hemoglobin: 13.4 g/dL (ref 12.0–15.0)
MCH: 30 pg (ref 26.0–34.0)
MCHC: 31.5 g/dL (ref 30.0–36.0)
MCV: 95.5 fL (ref 80.0–100.0)
Platelets: 388 10*3/uL (ref 150–400)
RBC: 4.46 MIL/uL (ref 3.87–5.11)
RDW: 13.9 % (ref 11.5–15.5)
WBC: 18.4 10*3/uL — ABNORMAL HIGH (ref 4.0–10.5)
nRBC: 0 % (ref 0.0–0.2)

## 2019-09-22 LAB — MAGNESIUM: Magnesium: 1.8 mg/dL (ref 1.7–2.4)

## 2019-09-22 LAB — CULTURE, BLOOD (ROUTINE X 2): Special Requests: ADEQUATE

## 2019-09-22 LAB — GLUCOSE, CAPILLARY: Glucose-Capillary: 188 mg/dL — ABNORMAL HIGH (ref 70–99)

## 2019-09-22 MED ORDER — METOPROLOL TARTRATE 25 MG PO TABS
25.0000 mg | ORAL_TABLET | Freq: Two times a day (BID) | ORAL | Status: DC
  Administered 2019-09-22 – 2019-09-23 (×2): 25 mg via ORAL
  Filled 2019-09-22 (×3): qty 1

## 2019-09-22 MED ORDER — IPRATROPIUM-ALBUTEROL 0.5-2.5 (3) MG/3ML IN SOLN: 3.0000 mL | RESPIRATORY_TRACT | Status: DC | PRN

## 2019-09-22 MED ORDER — FUROSEMIDE 10 MG/ML IJ SOLN
40.0000 mg | Freq: Once | INTRAMUSCULAR | Status: AC
  Administered 2019-09-22: 40 mg via INTRAVENOUS
  Filled 2019-09-22: qty 4

## 2019-09-22 MED ORDER — LEVALBUTEROL HCL 0.63 MG/3ML IN NEBU
INHALATION_SOLUTION | RESPIRATORY_TRACT | Status: AC
  Administered 2019-09-22: 0.63 mg
  Filled 2019-09-22: qty 3

## 2019-09-22 MED ORDER — METOPROLOL TARTRATE 25 MG PO TABS: 25.0000 mg | ORAL_TABLET | Freq: Two times a day (BID) | ORAL | Status: DC

## 2019-09-22 MED ORDER — METHYLPREDNISOLONE SODIUM SUCC 40 MG IJ SOLR
40.0000 mg | Freq: Every day | INTRAMUSCULAR | Status: DC
  Administered 2019-09-22 – 2019-09-24 (×3): 40 mg via INTRAVENOUS
  Filled 2019-09-22 (×3): qty 1

## 2019-09-22 MED ORDER — LEVALBUTEROL HCL 0.63 MG/3ML IN NEBU
0.6300 mg | INHALATION_SOLUTION | Freq: Four times a day (QID) | RESPIRATORY_TRACT | Status: DC | PRN
  Administered 2019-09-22: 0.63 mg via RESPIRATORY_TRACT
  Filled 2019-09-22: qty 3

## 2019-09-22 MED ORDER — AZITHROMYCIN 250 MG PO TABS
500.0000 mg | ORAL_TABLET | Freq: Every day | ORAL | Status: AC
  Administered 2019-09-22 – 2019-09-23 (×2): 500 mg via ORAL
  Filled 2019-09-22 (×2): qty 2

## 2019-09-22 MED ORDER — DILTIAZEM HCL 25 MG/5ML IV SOLN
10.0000 mg | Freq: Once | INTRAVENOUS | Status: AC
  Administered 2019-09-22: 10 mg via INTRAVENOUS
  Filled 2019-09-22: qty 5

## 2019-09-22 MED ORDER — METOPROLOL TARTRATE 5 MG/5ML IV SOLN
5.0000 mg | Freq: Once | INTRAVENOUS | Status: AC
  Administered 2019-09-22: 5 mg via INTRAVENOUS
  Filled 2019-09-22: qty 5

## 2019-09-22 MED ORDER — LORAZEPAM 2 MG/ML IJ SOLN
0.5000 mg | Freq: Once | INTRAMUSCULAR | Status: AC
  Administered 2019-09-22: 0.5 mg via INTRAVENOUS
  Filled 2019-09-22: qty 1

## 2019-09-22 NOTE — Progress Notes (Signed)
Notified NP Bodenheimer pt's HR was sustaining in the 140s, pt had audible wheezes, and was complaining of pain. RN obtained pt's vital signs and notified respiratory therapist of pt status. Will continue to monitor and assess pt status. RN will administer IV cardizem as ordered.

## 2019-09-22 NOTE — Progress Notes (Signed)
Pt was found complaining of being unable to breathe, sweating, and complaining of being hot. Pt was wheezing with a heart rate of 140.  Pt states that she had drank some water, this RN thinks possible aspiration. RN sat patient up straight and up 02 to 4L. Charge RN notified and Night MD Bodenheimer paged.

## 2019-09-22 NOTE — TOC Initial Note (Signed)
Transition of Care [facility]) - Initial/Assessment Note    Patient Details  Name: Janet Reeves MRN: [id number] Date of Birth: 11-Oct-1929  [facility] CM/SW Contact:    Janet Reeves, Janet Reeves Phone Number: 09/22/2019, 6:56 PM  Clinical Narrative:                  CSW called and spoke with the patient's daughter, Janet Reeves. CSW introduced herself and explained her role. CSW shared therapy recommendation. Janet Reeves was doubtful that her mother would progress in therapy. She stated that her mother has severe depression and does not want to get out of bed some days. She stated that she would be fearful that if she moved her mother to an unfamiliar environment that she would decline. She stated that her mother suffers from memory impairment and that they are moving her to another room at [facility]. She stated that the [facility] is also going to ramp up her nursing care. Janet Reeves wants her mother to return to her room at [facility] and receive therapy.   CSW reassured her that she acknowledges her wishes. CSW shared that some ALF's do not accept patient's back if that have severe skilled needs. CSW shared that most of the time they recommend short term rehab and after they are able to return. CSW stated she would have to follow up with [facility] and see what their policy was regarding the patient returning. Janet Reeves thanked the CSW for her input and knowledge. Janet Reeves requested that the MD call her and discuss further planning.   CSW contacted Dr. Lonny Reeves and informed him that the patient's daughter requested him to call her.   CSW will continue to follow and assist with disposition planning.   Expected Discharge Plan: Assisted Living Barriers to Discharge: Continued Medical Work up   Patient Goals and CMS Choice Patient states their goals for this hospitalization and ongoing recovery are:: Pt daughter wants her mother to return to her ALF with home health CMS Medicare.gov  Compare Post Acute Care list provided to:: Patient Represenative (must comment) Choice offered to / list presented to : NA  Expected Discharge Plan and Services Expected Discharge Plan: Assisted Living In-house Referral: Clinical Social Work Discharge Planning Services: NA Post Acute Care Choice: [place] arrangements for the past 2 months: [place]                 DME Arranged: N/A DME Agency: NA       HH Arranged: NA [place] Agency: NA        Prior Living Arrangements/Services Living arrangements for the past 2 months: [place] Lives with:: Facility Resident Patient language and need for interpreter reviewed:: No Do you feel safe going back to the place where you live?: Yes      Need for Family Participation in Patient Care: Yes (Comment) Care giver support system in place?: Yes (comment) Current home services: Home RN Criminal Activity/Legal Involvement Pertinent to Current Situation/Hospitalization: No - Comment as needed  Activities of Daily Living Home Assistive Devices/Equipment: Environmental consultant (specify type), Shower chair with back, Cane (specify quad or straight), Dentures (specify type)(straight cane, front wheel walker) ADL Screening (condition at time of admission) Patient's cognitive ability adequate to safely complete daily activities?: Yes Is the patient deaf or have difficulty hearing?: No Does the patient have difficulty seeing, even when wearing glasses/contacts?: No Does the patient have difficulty concentrating, remembering, or making decisions?: Yes(sometimes) Patient able to  express need for assistance with ADLs?: Yes Does the patient have difficulty dressing or bathing?: No Independently performs ADLs?: No Communication: Independent Dressing (OT): Needs assistance Is this a change from baseline?: Pre-admission baseline Grooming: Needs assistance Is this a change from baseline?: Pre-admission baseline Feeding:  Independent Bathing: Needs assistance Is this a change from baseline?: Pre-admission baseline Toileting: Independent with device (comment) In/Out Bed: Independent with device (comment) Walks in Home: Independent with device (comment) Does the patient have difficulty walking or climbing stairs?: Yes Weakness of Legs: None Weakness of Arms/Hands: None  Permission Sought/Granted Permission sought to share information with : Case Manager Permission granted to share information with : Yes, Verbal Permission Granted     Permission granted to share info w AGENCY: [facility]        Emotional Assessment Appearance:: Appears stated age Attitude/Demeanor/Rapport: Unable to Assess Affect (typically observed): Unable to Assess Orientation: : Oriented to Self, Oriented to Place, Oriented to Situation Alcohol / Substance Use: Not Applicable Psych Involvement: No (comment)  Admission diagnosis:  CHF (congestive heart failure) ([place]) [I50.9] Pneumonia [J18.9] Patient Active Problem List   Diagnosis Date Noted  . Healthcare-associated pneumonia 09/29/2019  . Acute CHF (congestive heart failure) ([place]) 10/08/2019  . Pneumonia 09/18/2019  . Syncope 10/19/2016  . Chronic sciatica of right side 06/03/2015  . Medication management 03/29/2014  . GERD 03/29/2014  . Hyperlipidemia 10/01/2013  . Prediabetes 10/01/2013  . Vitamin D deficiency 10/01/2013  . Hypothyroidism 10/01/2013  . Breast cancer ([place]) 10/01/2013  . Lymphoma ([place]) 10/01/2013  . Essential hypertension 09/30/2013   PCP:  Jacklyn Shell, [place] Pharmacy:  No Pharmacies Listed    Social Determinants of Health (SDOH) Interventions    Readmission Risk Interventions No flowsheet data found.

## 2019-09-22 NOTE — [place] FL2 (Signed)
[place] MEDICAID FL2 LEVEL OF CARE SCREENING TOOL     IDENTIFICATION  Patient Name: Janet Reeves Birthdate: Mar 07, 1929 Sex: female Admission Date (Current Location): 10/08/2019  [facility] and [place] Number:  Herbalist and Address:  [facility], [place] [address], [place], [place] [address]      Provider Number: [id number]  Attending Physician Name and Address:  Mariel Aloe, MD  Relative Name and Phone Number:  Dorris Singh, Daughter, [phone]    Current Level of Care: Hospital Recommended Level of Care: [place] Prior Approval Number:    Date Approved/Denied:   PASRR Number:    Discharge Plan: Domiciliary (Rest home)    Current Diagnoses: Patient Active Problem List   Diagnosis Date Noted  . Healthcare-associated pneumonia 10/07/2019  . Acute CHF (congestive heart failure) ([place]) 10/04/2019  . Pneumonia 09/14/2019  . Syncope 10/19/2016  . Chronic sciatica of right side 06/03/2015  . Medication management 03/29/2014  . GERD 03/29/2014  . Hyperlipidemia 10/01/2013  . Prediabetes 10/01/2013  . Vitamin D deficiency 10/01/2013  . Hypothyroidism 10/01/2013  . Breast cancer ([place]) 10/01/2013  . Lymphoma ([place]) 10/01/2013  . Essential hypertension 09/30/2013    Orientation RESPIRATION BLADDER Height & Weight     Self, Place, Situation  Normal Incontinent, Indwelling catheter Weight: 125 lb 14.1 oz (57.1 kg) Height:  5' 6.5" (168.9 cm)  BEHAVIORAL SYMPTOMS/MOOD NEUROLOGICAL BOWEL NUTRITION STATUS      Incontinent Diet(full liquid diet, thin liquids)  AMBULATORY STATUS COMMUNICATION OF NEEDS Skin   Limited Assist Verbally Other (Comment)(MASD on pernieum, groin, buttocks and non-pressure wound on breast)                       Personal Care Assistance Level of Assistance  Bathing, Feeding, Total care, Dressing Bathing Assistance: Maximum assistance Feeding assistance: Limited assistance Dressing  Assistance: Maximum assistance Total Care Assistance: Maximum assistance   Functional Limitations Info  Sight, Speech, Hearing Sight Info: Adequate Hearing Info: Adequate Speech Info: Adequate    SPECIAL CARE FACTORS FREQUENCY  PT (By licensed PT), OT (By licensed OT)     PT Frequency: 5x/wk OT Frequency: 5x/wk            Contractures Contractures Info: Not present    Additional Factors Info  Code Status, Allergies Code Status Info: DNR Allergies Info: Codeine, Macrobid (Nitrofurantoin Macrocrystal)           Current Medications (09/22/2019):  This is the current hospital active medication list Current Facility-Administered Medications  Medication Dose Route Frequency Provider Last Rate Last Dose  . 0.9 %  sodium chloride infusion  250 mL Intravenous PRN Norins, Heinz Knuckles, MD      . acetaminophen (TYLENOL) tablet 650 mg  650 mg Oral Q6H PRN Neena Rhymes, MD   650 mg at 09/20/19 1115   Or  . acetaminophen (TYLENOL) suppository 650 mg  650 mg Rectal Q6H PRN Norins, Heinz Knuckles, MD      . aspirin EC tablet 81 mg  81 mg Oral Daily Neena Rhymes, MD   81 mg at 09/22/19 0806  . azithromycin (ZITHROMAX) tablet 500 mg  500 mg Oral Daily Steenwyk, Yujing Z, RPH   500 mg at 09/22/19 1407  . cefTRIAXone (ROCEPHIN) 1 g in sodium chloride 0.9 % 100 mL IVPB  1 g Intravenous Q24H Mariel Aloe, MD 200 mL/hr at 09/22/19 1407 1 g at 09/22/19 1407  . docusate  sodium (COLACE) capsule 100 mg  100 mg Oral BID Neena Rhymes, MD   100 mg at 09/22/19 0806  . donepezil (ARICEPT) tablet 10 mg  10 mg Oral QHS Norins, Heinz Knuckles, MD   10 mg at 09/21/19 2105  . enoxaparin (LOVENOX) injection 30 mg  30 mg Subcutaneous QHS Norins, Heinz Knuckles, MD   30 mg at 09/21/19 2105  . ipratropium-albuterol (DUONEB) 0.5-2.5 (3) MG/3ML nebulizer solution 3 mL  3 mL Nebulization Q4H PRN Bodenheimer, Charles A, NP      . levalbuterol (XOPENEX) nebulizer solution 0.63 mg  0.63 mg Nebulization Q6H PRN Mariel Aloe, MD   0.63 mg at 09/22/19 0927  . levothyroxine (SYNTHROID) tablet 100 mcg  100 mcg Oral Q0600 Mariel Aloe, MD   100 mcg at 09/22/19 [id number]  . methylPREDNISolone sodium succinate (SOLU-MEDROL) 40 mg/mL injection 40 mg  40 mg Intravenous Daily Mariel Aloe, MD   40 mg at 09/22/19 1407  . sodium chloride flush (NS) 0.9 % injection 3 mL  3 mL Intravenous Q12H Norins, Heinz Knuckles, MD   3 mL at 09/22/19 0807  . sodium chloride flush (NS) 0.9 % injection 3 mL  3 mL Intravenous PRN Norins, Heinz Knuckles, MD      . traMADol Veatrice Bourbon) tablet 50 mg  50 mg Oral Q6H PRN Neena Rhymes, MD   50 mg at 09/22/19 [id number]  . venlafaxine XR (EFFEXOR-XR) 24 hr capsule 75 mg  75 mg Oral Q breakfast Norins, Heinz Knuckles, MD   75 mg at 09/22/19 0807  . vitamin B-12 (CYANOCOBALAMIN) tablet 1,000 mcg  1,000 mcg Oral Daily Norins, Heinz Knuckles, MD   1,000 mcg at 09/22/19 [id number]     Discharge Medications: Please see discharge summary for a list of discharge medications.  Relevant Imaging Results:  Relevant Lab Results:   Additional Information SSN: [id number]  [place], LCSWA

## 2019-09-22 NOTE — Progress Notes (Signed)
Patient has yellow MEWS score this morning. She is looking pale and diaphoretic, HR has been elevated all night, even after Cardizem. Tried Ativan with no improvement. Patient is wheezing and her breathing is tachypneic and labored. O2 increased to 10L high flow with Sat 88%, which improved to 93%. Notified RT, but unable to give Albuterol. Notified Dr. Georgena Spurling and also asked for Xopenex. Will continue to monitor.   09/22/19 0722  Vitals  Temp 98.2 F (36.8 C)  Temp Source Oral  BP (!) 119/96  MAP (mmHg) 103  BP Location Right Arm  BP Method Automatic  Patient Position (if appropriate) Lying  Pulse Rate (!) 117  Pulse Rate Source Dinamap  Oxygen Therapy  SpO2 95 %  O2 Device Nasal Cannula  O2 Flow Rate (L/min) 6 L/min  MEWS Score  MEWS RR 0  MEWS Pulse 2  MEWS Systolic 0  MEWS LOC 0  MEWS Temp 0  MEWS Score 2  MEWS Score Color Yellow

## 2019-09-22 NOTE — Progress Notes (Signed)
RT called to patient's room due to patient wheezing, RT assessed patient and due to patient's heart rate of 140, the prn treatment was changed to xopenex. Patient was placed on 6L [place] and vitals were stable.

## 2019-09-22 NOTE — Progress Notes (Signed)
Dr. Lonny Prude at bedside to assess patient. Orders carried out - Lasix IV and Metoprolol IV given at 0927 with a Xopenex treatment. Patient is looking better, able to decrease HFNC to 5L with a sat now at 94%. Patient's breathing is less labored, but still tachypneic. Denies any chest pain and surprisingly, denies feeling short of breath. Following commands. Able to tell us her name. Placed on step-down monitor to better monitor progress.   09/22/19 0936  Vitals  BP 107/67  MAP (mmHg) 81  BP Location Right Arm  BP Method Automatic  Patient Position (if appropriate) Lying  Pulse Rate 94  Pulse Rate Source Monitor  ECG Heart Rate 94  Resp (!) 32  Level of Consciousness  Level of Consciousness Alert  Oxygen Therapy  SpO2 97 %  O2 Device HFNC  O2 Flow Rate (L/min) 5 L/min  MEWS Score  MEWS RR 2  MEWS Pulse 0  MEWS Systolic 0  MEWS LOC 0  MEWS Temp 0  MEWS Score 2  MEWS Score Color Yellow  MEWS Assessment  Is this an acute change? Yes  Provider Notification  Provider Name/Title Cordelia Poche  Date Provider Notified 09/22/19  Time Provider Notified 0901  Notification Type Page  Notification Reason Change in status  Response See new orders;Other (Comment) (saw pt at bedside)

## 2019-09-22 NOTE — Progress Notes (Signed)
PROGRESS NOTE    Janet Reeves  [id number] DOB: 1929-04-25 DOA: 09/17/2019 PCP: Jacklyn Shell, FNP   Brief Narrative: Janet Reeves is a 85 y.o. female with a history of hypertension, hypothyroidism, breast cancer, dementia. Patient presented secondary to shortness of breath with initial concern for pneumonia vs heart failure.   Assessment & Plan:   Active Problems:   Essential hypertension   Hypothyroidism   Breast cancer (HCC)   Healthcare-associated pneumonia   Acute CHF (congestive heart failure) (HCC)   Pneumonia   Acute heart failure Transthoracic Echocardiogram not suggestive of systolic heart failure. No mention of diastolic function. With elevated BNP and chest x-ray significant for infiltrates and cardiomegaly, will assume she has some diastolic dysfunction. -Continue Lasix -PT recommendations: SNF -Strict in/out and daily weights  ?HCAP Possible diagnosis. Diagnosed on admission. Started on empiric Ceftriaxone and azithromycin. Procalcitonin undetectable. Wheezing on exam. Leukocytosis increased slightly. Respiratory distress this morning possibly precipitated by atrial fibrillation -Continue Ceftriaxone and Azithromycin -Solu-medrol, Xopenex, Lasix   Atrial fibrillation Transient. TSH obtained today and was low. Confirmed Synthroid dose of 112 mcg from ALF. Magnesium low. -Continue telemetry -Adjusted Synthroid  Essential hypertension Well controlled. Not on medication  Hypothyroidism TSH low. Patient receiving Synthroid 112 mcg at ALF. Per review, dose increased from 100 mcg to 112 mcg on 05/19/2019. Most recent TSH from ALF was 7.51 on 07/05/2019. Numbers are slightly difficult to interpret, but since she has a low TSH at present with tachycardic episodes, will decrease Synthroid dose -Continue decreased dose of Synthroid 100 mcg daily  History of breast cancer Outpatient follow-up with oncology/palliative care  Dementia -Continue Aricept   Abdominal pain Patient reports generalized abdominal pain that improved with bowel movement. -Abdominal x-ray  Positive blood culture 2/4 positive for GPC. Final result is coagulase negative staph. Likely contamination.   DVT prophylaxis: Lovenox Code Status:   Code Status: DNR Family Communication: None Disposition Plan: Discharge to SNF when medically stable for discharge (pending culture data, improvement of symptoms, wean to room air)   Consultants:   None  Procedures:   11/12: Transthoracic Echocardiogram IMPRESSIONS    1. There is moderate to severe mitral regurgitation present (likely severe). The mitral valve annulus and PMVL are heavily calcified consistent with degenerative mitral valve disease. The regurgitation is poorly interrogated on this study. The  regurgitant jet is posteriorly directed, reaching the pulmonary veins. Would consider a TEE for better quantification.  2. A mild to moderate degree of aortic stenosis is present. The Vmax is 2.1 m/s, and mean gradient 10.3 mmHG. AVA by continuity is 1.20 cm2. The DI is 0.47. The peak velocity and gradients are underestimated due to low SV (SV=42 cc; SVi=25 cc/m2). I  suspect at least moderate AS is present.  3. Left ventricular ejection fraction, by visual estimation, is 60 to 65%. The left ventricle has normal function. There is mildly increased left ventricular hypertrophy.  4. Left ventricular diastolic function could not be evaluated.  5. Global right ventricle has normal systolic function.The right ventricular size is normal. No increase in right ventricular wall thickness.  6. Left atrial size was severely dilated.  7. Right atrial size was normal.  8. Presence of pericardial fat pad.  9. Trivial pericardial effusion is present. 10. Moderate calcification of the mitral valve leaflet(s). 11. Moderate thickening of the mitral valve leaflet(s). 12. Moderate to severe mitral annular calcification. 13. The mitral  valve is degenerative. Moderate to severe mitral valve regurgitation. 14. The tricuspid valve  is grossly normal. Tricuspid valve regurgitation is trivial. 15. The aortic valve is tricuspid. Aortic valve regurgitation is not visualized. Mild to moderate aortic valve stenosis. 16. There is Severe calcifcation of the aortic valve. 17. There is Severely thickening of the aortic valve. 18. The pulmonic valve was grossly normal. Pulmonic valve regurgitation is not visualized. 19. Normal pulmonary artery systolic pressure. 20. The tricuspid regurgitant velocity is 2.51 m/s, and with an assumed right atrial pressure of 8 mmHg, the estimated right ventricular systolic pressure is normal at 33.2 mmHg. 21. The inferior vena cava is normal in size with <50% respiratory variability, suggesting right atrial pressure of 8 mmHg. 22. Small left ventricular internal cavity size.  Antimicrobials:  Ceftriaxone  Azithromycin    Subjective: No dyspnea or chest pain per patient  Objective: Vitals:   09/22/19 0649 09/22/19 0705 09/22/19 0722 09/22/19 0936  BP: 131/90 123/86 (!) 119/96 107/67  Pulse: (!) 128 (!) 134 (!) 117 94  Resp: (!) 38 19  (!) 32  Temp:   98.2 F (36.8 C)   TempSrc:   Oral   SpO2: 93% 94% 95% 97%  Weight:      Height:        Intake/Output Summary (Last 24 hours) at 09/22/2019 1051 Last data filed at 09/22/2019 0657 Gross per 24 hour  Intake 243 ml  Output 201 ml  Net 42 ml   Filed Weights   09/13/2019 2227  Weight: 57.1 kg    Examination:  General exam: Appears calm and comfortable Respiratory system: Diminished with wheezing bilaterally. Respiratory effort increased Cardiovascular system: S1 & S2 heard, tachycardia, irregular rhythm. No murmurs, rubs, gallops or clicks. Gastrointestinal system: Abdomen is nondistended, soft and nontender. No organomegaly or masses felt. Normal bowel sounds heard. Central nervous system: Alert and oriented to person. No focal  neurological deficits. Extremities: No calf tenderness Skin: No cyanosis. No rashes Psychiatry: Judgement and insight appear normal. Mood & affect appropriate.     Data Reviewed: I have personally reviewed following labs and imaging studies  CBC: Recent Labs  Lab 10/02/2019 1113 09/21/19 0915  WBC 11.8* 13.0*  NEUTROABS 10.2*  --   HGB 14.4 13.3  HCT 43.8 40.5  MCV 93.2 93.1  PLT PLATELET CLUMPS NOTED ON SMEAR, COUNT APPEARS ADEQUATE [id number]   Basic Metabolic Panel: Recent Labs  Lab 09/25/2019 1609 09/20/19 1845 09/21/19 0347  NA 138  --  143  K 4.3  --  3.9  CL 103  --  106  CO2 18*  --  23  GLUCOSE 157*  --  101*  BUN 16  --  13  CREATININE 1.20*  --  0.98  CALCIUM 9.9  --  9.3  MG  --  1.2* 1.6*   GFR: Estimated Creatinine Clearance: 35.8 mL/min (by C-G formula based on SCr of 0.98 mg/dL). Liver Function Tests: Recent Labs  Lab 09/13/2019 1609  AST 16  ALT 11  ALKPHOS 51  BILITOT 0.8  PROT 6.5  ALBUMIN 3.3*   No results for input(s): LIPASE, AMYLASE in the last 168 hours. No results for input(s): AMMONIA in the last 168 hours. Coagulation Profile: No results for input(s): INR, PROTIME in the last 168 hours. Cardiac Enzymes: No results for input(s): CKTOTAL, CKMB, CKMBINDEX, TROPONINI in the last 168 hours. BNP (last 3 results) No results for input(s): PROBNP in the last 8760 hours. HbA1C: No results for input(s): HGBA1C in the last 72 hours. CBG: Recent Labs  Lab 09/22/19 0721  GLUCAP  188*   Lipid Profile: Recent Labs    09/16/2019 2307  TRIG 160*   Thyroid Function Tests: Recent Labs    09/21/19 0915  TSH 0.223*   Anemia Panel: Recent Labs    09/20/2019 1730  FERRITIN 61   Sepsis Labs: Recent Labs  Lab 09/17/2019 1114 10/03/2019 1609 09/10/2019 2256  PROCALCITON  --   --  <0.10  LATICACIDVEN 2.4* 1.9  --     Recent Results (from the past 240 hour(s))  SARS CORONAVIRUS 2 (TAT 6-24 HRS) Nasopharyngeal Nasopharyngeal Swab     Status: None    Collection Time: 10/03/2019  4:16 PM   Specimen: Nasopharyngeal Swab  Result Value Ref Range Status   SARS Coronavirus 2 NEGATIVE NEGATIVE Final    Comment: (NOTE) SARS-CoV-2 target nucleic acids are NOT DETECTED. The SARS-CoV-2 RNA is generally detectable in upper and lower respiratory specimens during the acute phase of infection. Negative results do not preclude SARS-CoV-2 infection, do not rule out co-infections with other pathogens, and should not be used as the sole basis for treatment or other patient management decisions. Negative results must be combined with clinical observations, patient history, and epidemiological information. The expected result is Negative. Fact Sheet for Patients: SugarRoll.be Fact Sheet for Healthcare Providers: https://www.woods-mathews.com/ This test is not yet approved or cleared by the [place] FDA and  has been authorized for detection and/or diagnosis of SARS-CoV-2 by FDA under an Emergency Use Authorization (EUA). This EUA will remain  in effect (meaning this test can be used) for the duration of the COVID-19 declaration under Section 56 4(b)(1) of the Act, 21 U.S.C. section 360bbb-3(b)(1), unless the authorization is terminated or revoked sooner. Performed at [facility], [place] [address] [place], [place] [address]   Respiratory Panel by PCR     Status: None   Collection Time: 09/09/2019  4:43 PM   Specimen: Nasopharyngeal Swab; Respiratory  Result Value Ref Range Status   Adenovirus NOT DETECTED NOT DETECTED Final   Coronavirus 229E NOT DETECTED NOT DETECTED Final    Comment: (NOTE) The Coronavirus on the Respiratory Panel, DOES NOT test for the novel  Coronavirus (2019 nCoV)    Coronavirus HKU1 NOT DETECTED NOT DETECTED Final   Coronavirus NL63 NOT DETECTED NOT DETECTED Final   Coronavirus OC43 NOT DETECTED NOT DETECTED Final   Metapneumovirus NOT DETECTED NOT DETECTED Final    Rhinovirus / Enterovirus NOT DETECTED NOT DETECTED Final   Influenza A NOT DETECTED NOT DETECTED Final   Influenza B NOT DETECTED NOT DETECTED Final   Parainfluenza Virus 1 NOT DETECTED NOT DETECTED Final   Parainfluenza Virus 2 NOT DETECTED NOT DETECTED Final   Parainfluenza Virus 3 NOT DETECTED NOT DETECTED Final   Parainfluenza Virus 4 NOT DETECTED NOT DETECTED Final   Respiratory Syncytial Virus NOT DETECTED NOT DETECTED Final   Bordetella pertussis NOT DETECTED NOT DETECTED Final   Chlamydophila pneumoniae NOT DETECTED NOT DETECTED Final   Mycoplasma pneumoniae NOT DETECTED NOT DETECTED Final    Comment: Performed at [facility], [place]. [address] [place], [place] [address]  Blood culture (routine x 2)     Status: None (Preliminary result)   Collection Time: 10/07/2019  4:52 PM   Specimen: BLOOD LEFT FOREARM  Result Value Ref Range Status   Specimen Description BLOOD LEFT FOREARM  Final   Special Requests   Final    BOTTLES DRAWN AEROBIC AND ANAEROBIC Blood Culture adequate volume   Culture   Final  NO GROWTH 2 DAYS Performed at [facility], [place] [address], [place], [place] [address]    Report Status PENDING  Incomplete  Blood culture (routine x 2)     Status: Abnormal   Collection Time: 09/18/2019  4:52 PM   Specimen: BLOOD  Result Value Ref Range Status   Specimen Description BLOOD LEFT ANTECUBITAL  Final   Special Requests   Final    BOTTLES DRAWN AEROBIC AND ANAEROBIC Blood Culture adequate volume   Culture  Setup Time   Final    GRAM POSITIVE COCCI IN BOTH AEROBIC AND ANAEROBIC BOTTLES CRITICAL RESULT CALLED TO, READ BACK BY AND VERIFIED WITH: PHARM J FRENS [id number] AT 4 AM BY CM    Culture (A)  Final    STAPHYLOCOCCUS SPECIES (COAGULASE NEGATIVE) THE SIGNIFICANCE OF ISOLATING THIS ORGANISM FROM A SINGLE SET OF BLOOD CULTURES WHEN MULTIPLE SETS ARE DRAWN IS UNCERTAIN. PLEASE NOTIFY THE MICROBIOLOGY DEPARTMENT WITHIN ONE WEEK IF SPECIATION AND SENSITIVITIES  ARE REQUIRED. Performed at [facility], [place] [address] [place], [place] [address]    Report Status 09/22/2019 FINAL  Final         Radiology Studies: Dg Chest 2 View  Result Date: 09/20/2019 CLINICAL DATA:  Pneumonia.  CHF. EXAM: CHEST - 2 VIEW COMPARISON:  07/20/2019.  02/27/2013. FINDINGS: Stable cardiomegaly. Diffuse multifocal bilateral pulmonary infiltrates again noted. Infiltrates are slightly increased in the upper lobes day's exam small left pleural effusion. Prominence sliding hiatal hernia again noted. Stable lower thoracic vertebral body compression fracture. Surgical clips right axilla. IMPRESSION: 1. Diffuse multifocal bilateral pulmonary infiltrates again noted pulmonary infiltrates have slightly increased in the upper lobes on today's exam. 2. Stable cardiomegaly. 3. Prominent hiatal hernia again noted. Electronically Signed   By: Marcello Moores  Register   On: 09/20/2019 14:08   Dg Chest Port 1 View  Result Date: 09/22/2019 CLINICAL DATA:  Respiratory distress.  Aspiration into the airway. EXAM: PORTABLE CHEST 1 VIEW COMPARISON:  09/20/2019 FINDINGS: Stable cardiac enlargement. Bilateral pleural effusions and interstitial edema is unchanged. Multifocal bilateral airspace opacities are again noted particularly in the right upper lobe and left base. These are not significantly changed in the interval. IMPRESSION: 1. No change in bilateral airspace opacities and changes of CHF. Electronically Signed   By: Kerby Moors M.D.   On: 09/22/2019 06:44   Dg Abd Portable 1v  Result Date: 09/21/2019 CLINICAL DATA:  Diarrhea, abdominal pain. EXAM: PORTABLE ABDOMEN - 1 VIEW COMPARISON:  January 24, 2010. FINDINGS: The bowel gas pattern is normal. No radio-opaque calculi or other significant radiographic abnormality are seen. IMPRESSION: No evidence of bowel obstruction or ileus. Electronically Signed   By: Marijo Conception M.D.   On: 09/21/2019 12:31        Scheduled Meds: . aspirin  EC  81 mg Oral Daily  . docusate sodium  100 mg Oral BID  . donepezil  10 mg Oral QHS  . enoxaparin (LOVENOX) injection  30 mg Subcutaneous QHS  . levothyroxine  100 mcg Oral Q0600  . sodium chloride flush  3 mL Intravenous Q12H  . venlafaxine XR  75 mg Oral Q breakfast  . vitamin B-12  1,000 mcg Oral Daily   Continuous Infusions: . sodium chloride    . azithromycin (ZITHROMAX) 500 MG IVPB (Vial-Mate [employer]) Stopped (09/22/19 0124)  . cefTRIAXone (ROCEPHIN)  IV Stopped (09/21/19 2150)     LOS: 3 days     Cordelia Poche, MD [facility] 09/22/2019, 10:51  AM  If 7PM-7AM, please contact night-coverage [email or web address]

## 2019-09-22 NOTE — Progress Notes (Addendum)
This patient is receiving the antibiotic(s) azithromycin 500 mg by the intravenous route. Based on criteria approved by the Pharmacy and Therapeutics Committee, and the Infectious Disease Division, the antibiotic(s) is / are being converted to equivalent oral dose form(s). These criteria include: . Patient being treated for a respiratory tract infection, urinary tract infection, cellulitis, or Clostridium Difficile Associated Diarrhea . The patient is not neutropenic and does not exhibit a GI malabsorption state . The patient is eating (either orally or per tube) and/or has been taking other orally administered medications for at least 24 hours. . The patient is improving clinically (physician assessment and a 24-hour Tmax of ? 100.5? F). If you have questions about this conversion, please contact the pharmacy department. Thank you.  Berenice Bouton, PharmD PGY1 Pharmacy Resident Office phone: [phone] Phone until 3:30 pm: [phone] 09/22/2019 1:18 PM

## 2019-09-23 ENCOUNTER — Inpatient Hospital Stay (HOSPITAL_COMMUNITY): Payer: Medicare Other

## 2019-09-23 DIAGNOSIS — I471 Supraventricular tachycardia: Secondary | ICD-10-CM

## 2019-09-23 DIAGNOSIS — I495 Sick sinus syndrome: Secondary | ICD-10-CM

## 2019-09-23 DIAGNOSIS — I5031 Acute diastolic (congestive) heart failure: Secondary | ICD-10-CM

## 2019-09-23 DIAGNOSIS — I340 Nonrheumatic mitral (valve) insufficiency: Secondary | ICD-10-CM

## 2019-09-23 DIAGNOSIS — I4891 Unspecified atrial fibrillation: Secondary | ICD-10-CM

## 2019-09-23 DIAGNOSIS — I441 Atrioventricular block, second degree: Secondary | ICD-10-CM

## 2019-09-23 LAB — CBC
HCT: 41.2 % (ref 36.0–46.0)
Hemoglobin: 13.2 g/dL (ref 12.0–15.0)
MCH: 30.3 pg (ref 26.0–34.0)
MCHC: 32 g/dL (ref 30.0–36.0)
MCV: 94.5 fL (ref 80.0–100.0)
Platelets: 379 10*3/uL (ref 150–400)
RBC: 4.36 MIL/uL (ref 3.87–5.11)
RDW: 13.6 % (ref 11.5–15.5)
WBC: 15.5 10*3/uL — ABNORMAL HIGH (ref 4.0–10.5)
nRBC: 0 % (ref 0.0–0.2)

## 2019-09-23 LAB — BASIC METABOLIC PANEL
Anion gap: 17 — ABNORMAL HIGH (ref 5–15)
BUN: 23 mg/dL (ref 8–23)
CO2: 22 mmol/L (ref 22–32)
Calcium: 10 mg/dL (ref 8.9–10.3)
Chloride: 102 mmol/L (ref 98–111)
Creatinine, Ser: 0.96 mg/dL (ref 0.44–1.00)
GFR calc Af Amer: >60 mL/min (ref >60)
GFR calc non Af Amer: 53 mL/min — ABNORMAL LOW (ref >60)
Glucose, Bld: 114 mg/dL — ABNORMAL HIGH (ref 70–99)
Potassium: 4.3 mmol/L (ref 3.5–5.1)
Sodium: 141 mmol/L (ref 135–145)

## 2019-09-23 LAB — HEPARIN LEVEL (UNFRACTIONATED): Heparin Unfractionated: 0.18 IU/mL — ABNORMAL LOW (ref 0.30–0.70)

## 2019-09-23 MED ORDER — LEVALBUTEROL HCL 0.63 MG/3ML IN NEBU
0.6300 mg | INHALATION_SOLUTION | Freq: Two times a day (BID) | RESPIRATORY_TRACT | Status: DC
  Administered 2019-09-23 – 2019-09-25 (×4): 0.63 mg via RESPIRATORY_TRACT
  Filled 2019-09-23 (×5): qty 3

## 2019-09-23 MED ORDER — FUROSEMIDE 10 MG/ML IJ SOLN
40.0000 mg | Freq: Two times a day (BID) | INTRAMUSCULAR | Status: DC
  Administered 2019-09-23 (×2): 40 mg via INTRAVENOUS
  Filled 2019-09-23 (×2): qty 4

## 2019-09-23 MED ORDER — HEPARIN (PORCINE) 25000 UT/250ML-% IV SOLN
1000.0000 [IU]/h | INTRAVENOUS | Status: DC
  Administered 2019-09-23: 800 [IU]/h via INTRAVENOUS
  Administered 2019-09-24: 1000 [IU]/h via INTRAVENOUS
  Filled 2019-09-23 (×2): qty 250

## 2019-09-23 MED ORDER — LEVALBUTEROL HCL 0.63 MG/3ML IN NEBU
0.6300 mg | INHALATION_SOLUTION | Freq: Four times a day (QID) | RESPIRATORY_TRACT | Status: DC
  Filled 2019-09-23: qty 3

## 2019-09-23 NOTE — Progress Notes (Signed)
ANTICOAGULATION CONSULT NOTE - [facility] for heparin Indication: atrial fibrillation  Allergies  Allergen Reactions  . Codeine Nausea And Vomiting  . Macrobid [Nitrofurantoin Macrocrystal] Other (See Comments)    Dizziness.    Patient Measurements: Height: 5' 6.5" (168.9 cm) Weight: 125 lb 14.1 oz (57.1 kg) IBW/kg (Calculated) : 60.45 Heparin Dosing Weight: 57.1 kg  Vital Signs: Temp: 98.1 F (36.7 C) (11/15 1932) Temp Source: Oral (11/15 1932) BP: 117/79 (11/15 1932) Pulse Rate: 106 (11/15 1932)  Labs: Recent Labs    09/21/19 0347  09/21/19 0915 09/22/19 1329 09/23/19 0439 09/23/19 2048  HGB  --    < > 13.3 13.4 13.2  --   HCT  --   --  40.5 42.6 41.2  --   PLT  --   --  321 388 379  --   HEPARINUNFRC  --   --   --   --   --  0.18*  CREATININE 0.98  --   --  1.17* 0.96  --    < > = values in this interval not displayed.    Estimated Creatinine Clearance: 36.5 mL/min (by C-G formula based on SCr of 0.96 mg/dL).   Medical History: Past Medical History:  Diagnosis Date  . Breast cancer ([place])    Mastectomy and chemotherapy  . Diabetes mellitus ([place])    Diet controlled  . GERD (gastroesophageal reflux disease)   . Hyperlipidemia   . Hypertension   . Hypothyroid   . Lymphoma ([place])    Chemotherapy    Medications:  Scheduled:  . aspirin EC  81 mg Oral Daily  . docusate sodium  100 mg Oral BID  . donepezil  10 mg Oral QHS  . furosemide  40 mg Intravenous BID  . levalbuterol  0.63 mg Nebulization BID  . levothyroxine  100 mcg Oral Q0600  . methylPREDNISolone (SOLU-MEDROL) injection  40 mg Intravenous Daily  . sodium chloride flush  3 mL Intravenous Q12H  . venlafaxine XR  75 mg Oral Q breakfast  . vitamin B-12  1,000 mcg Oral Daily   Infusions:  . sodium chloride    . cefTRIAXone (ROCEPHIN)  IV 1 g (09/23/19 1344)  . heparin 800 Units/hr (09/23/19 1342)    Assessment: 89yoF admitted for SOB with concerns for acute CHF vs.  pneumonia. Found to have Afib vs. Atach and Atach with block. CHA2DS2VASc of 5. No AC PTA. Pharmacy consulted for heparin dosing.  Initial heparin level subtherapeutic at 0.18.  Goal of Therapy:  Heparin level 0.3-0.7 units/ml Monitor platelets by anticoagulation protocol: Yes   Plan:  -Increase heparin to 1000 units/hr -Recheck heparin level in 8h   Arrie Senate, PharmD, BCPS Clinical Pharmacist [phone] Please check AMION for all [facility] numbers 09/23/2019

## 2019-09-23 NOTE — Consult Note (Addendum)
Cardiology Consultation:   Patient ID: Janet Reeves MRN: [id number]; DOB: 10-Jul-1929  Admit date: 10/02/2019 Date of Consult: 09/23/2019  Primary Care Provider: Jacklyn Reeves, [place] Primary Cardiologist: No primary care provider on file. saw Janet Reeves in 2014 Primary Electrophysiologist:  None    Patient Profile:   Janet Reeves is a 85 y.o. female with a hx of breast cancer, DM-2 diet controlled, HLD, HTN hypothyroid and lymphoma who is being seen today for the evaluation of CHF and atrial fib at the request of Janet Reeves.  History of Present Illness:   Janet Reeves with above hx and remote cardiac eval for cardiomegaly on CXR but normal echo except LVH, now was admitted 10/05/2019 for SOB over last 2 weeks prior to admit and then even more the 3 days prior to admit.  Also a cough that had been dry.  Only chest pain with cough.  No fever.   No vomiting, chronic diarrhea.  EMS found her sp02 to be in the high 80s.    EKG:  The EKG was personally reviewed and demonstrates:  ST at 123 with freq PACs , LAD, incomplete RBBB and non specific T wave abnormalities.  Telemetry:  Telemetry was personally reviewed and demonstrates:  15 beats of NSVT, at 140 and either a flutter vs ST and freq PACs.   Also she has non conducted atrial beats,  At times 3:1 possible atrial tach with block.    troponins 29 & 30 due to SOB  Her BNP was 107  CRP 2.2  Today Na 141, K+ 4.3 Cr 0.96 Mg 1.8 WBC 15.5 with pk of 18.4 ( pt is on steroids), Hgb 13.2 and plts 379  TSH 0.223 DDimer 1.61   2 V CXR with diffuse multifocal bilateral pulmonary infiltrates that were increased form admit.  Stable cardiomegaly and prominent hiatal hernia.    PCXR today with slight improvement in aeration to Rt upper lobe  Persistent bilateral airspace opacities compatible with multifocal pneumonia, Persistent bilateral pleural effusions and pulmonary edema compatible with congestive heart failure.  Pt on lasix 40 mg IV BID and  I&O  is + 408 if accurate no weights.  Echo 09/20/19 with moderate to severe MR, likely severe, The mitral valve annulus and PMVL are heavily calcified consistent with degenerative mitral valve disease. The regurgitation is poorly interrogated on this study. The  regurgitant jet is posteriorly directed, reaching the pulmonary veins. Would consider a TEE for better quantification.  Mild to moderate AS is present The Vmax is 2.1 m/s, and mean gradient 10.3 mmHG. AVA by continuity is 1.20 cm2. The DI is 0.47. The peak velocity and gradients are underestimated due to low SV (SV=42 cc; SVi=25 cc/m2). I suspect at least moderate AS is present.   EF 60-65% mildly increased LVH.  Trivial pericardial effusion.     Currently she does tell me she has lightheadedness at home 2-3 times per week and she believes it is associated with rapid HR.  No syncope but she will need to sit down at times due to lightheadedness.   She is not aware of racing HR here in hospital.   HR 121 to 100, on 02 spo2 is 97%  No chest pain.    Heart Pathway Score:     Past Medical History:  Diagnosis Date  . Breast cancer ([place])    Mastectomy and chemotherapy  . Diabetes mellitus ([place])    Diet controlled  . GERD (gastroesophageal reflux disease)   .  Hyperlipidemia   . Hypertension   . Hypothyroid   . Lymphoma [facility])    Chemotherapy    Past Surgical History:  Procedure Laterality Date  . APPENDECTOMY    . CHOLECYSTECTOMY    . MASTECTOMY    . TONSILLECTOMY       Home Medications:  Prior to Admission medications   Medication Sig Start Date End Date Taking? Authorizing Provider  acetaminophen (TYLENOL) 500 MG tablet Take 1,000 mg by mouth 3 (three) times daily.   Yes [provider]  aspirin 81 MG tablet Take 81 mg by mouth daily.   Yes [provider]  Cholecalciferol (VITAMIN D3) 5000 UNITS CAPS Take 5,000 Units by mouth daily.   Yes [provider]  donepezil (ARICEPT) 10 MG tablet Take 10 mg by  mouth at bedtime.   Yes [provider]  levothyroxine (SYNTHROID) 112 MCG tablet Take 112 mcg by mouth daily. 09/06/19  Yes [provider]  Nutritional Supplements (ENSURE ORIGINAL) LIQD Take 237 mLs by mouth daily. Vanilla flavor at 2pm   Yes [provider]  potassium chloride (MICRO-K) 10 MEQ CR capsule Take 20 mEq by mouth daily.  08/27/19  Yes [provider]  venlafaxine XR (EFFEXOR-XR) 75 MG 24 hr capsule Take 75 mg by mouth daily. 08/27/19  Yes [provider]  vitamin B-12 (CYANOCOBALAMIN) 1000 MCG tablet Take 1,000 mcg by mouth daily.   Yes [provider]  citalopram (CELEXA) 20 MG tablet Take 1 tablet daily for depression Patient not taking: Reported on 10/01/2019 06/30/15 10/19/16  Unk Pinto, MD  levothyroxine (SYNTHROID) 150 MCG tablet Take 1 tablet (150 mcg total) by mouth daily. Patient not taking: Reported on 10/06/2019 01/23/15 10/19/16  Rolene Course, PA-C    Inpatient Medications: Scheduled Meds: . aspirin EC  81 mg Oral Daily  . azithromycin  500 mg Oral Daily  . docusate sodium  100 mg Oral BID  . donepezil  10 mg Oral QHS  . enoxaparin (LOVENOX) injection  30 mg Subcutaneous QHS  . furosemide  40 mg Intravenous BID  . levalbuterol  0.63 mg Nebulization Q6H  . levothyroxine  100 mcg Oral Q0600  . methylPREDNISolone (SOLU-MEDROL) injection  40 mg Intravenous Daily  . sodium chloride flush  3 mL Intravenous Q12H  . venlafaxine XR  75 mg Oral Q breakfast  . vitamin B-12  1,000 mcg Oral Daily   Continuous Infusions: . sodium chloride    . cefTRIAXone (ROCEPHIN)  IV Stopped (09/22/19 2317)   PRN Meds: sodium chloride, acetaminophen **OR** acetaminophen, levalbuterol, sodium chloride flush, traMADol  Allergies:    Allergies  Allergen Reactions  . Codeine Nausea And Vomiting  . Macrobid [Nitrofurantoin Macrocrystal] Other (See Comments)    Dizziness.    Social History:   Social History    Socioeconomic History  . Marital status: Widowed    Spouse name: Not on file  . Number of children: 2  . Years of education: Not on file  . Highest education level: Not on file  Occupational History  . Not on file  Social Needs  . Financial resource strain: Not on file  . Food insecurity    Worry: Not on file    Inability: Not on file  . Transportation needs    Medical: Not on file    Non-medical: Not on file  Tobacco Use  . Smoking status: Never Smoker  . Smokeless tobacco: Never Used  Substance and Sexual Activity  . Alcohol use: No  .  Drug use: No  . Sexual activity: Not on file  Lifestyle  . Physical activity    Days per week: Not on file    Minutes per session: Not on file  . Stress: Not on file  Relationships  . Social Herbalist on phone: Not on file    Gets together: Not on file    Attends religious service: Not on file    Active member of club or organization: Not on file    Attends meetings of clubs or organizations: Not on file    Relationship status: Not on file  . Intimate partner violence    Fear of current or ex partner: Not on file    Emotionally abused: Not on file    Physically abused: Not on file    Forced sexual activity: Not on file  Other Topics Concern  . Not on file  Social History Narrative  . Not on file    Family History:    Family History  Problem Relation Age of Onset  . Heart disease Father        Rhythm disturbance  . Hypertension Father   . Cancer Mother   . Cancer Sister   . Hypertension Daughter   . Hyperlipidemia Daughter   . Hypertension Sister   . Stroke Sister      ROS:  Please see the history of present illness.  General:no colds or fevers, no weight changes Skin:no rashes or ulcers HEENT:no blurred vision, no congestion CV:see HPI PUL:see HPI GI:no diarrhea constipation or melena, no indigestion GU:no hematuria, no dysuria MS:no joint pain, no claudication Neuro:no syncope, + lightheadedness  prior to admit 2-3 times per week.  Endo:+ diabetes diet controlled, + thyroid disease on synthroid  All other ROS reviewed and negative.     Physical Exam/Data:   Vitals:   09/23/19 0001 09/23/19 0230 09/23/19 0409 09/23/19 0736  BP: (!) 118/92  107/81 112/84  Pulse: (!) 112 (!) 110 (!) 112   Resp: (!) 31 (!) 30 20   Temp: 97.8 F (36.6 C)  98.1 F (36.7 C) 98 F (36.7 C)  TempSrc: Oral  Oral Axillary  SpO2: 99%  96% 98%  Weight:      Height:        Intake/Output Summary (Last 24 hours) at 09/23/2019 1007 Last data filed at 09/22/2019 2130 Gross per 24 hour  Intake 3 ml  Output -  Net 3 ml   Last 3 Weights 10/07/2019 10/19/2016 10/19/2016  Weight (lbs) 125 lb 14.1 oz 136 lb 9.6 oz 149 lb  Weight (kg) 57.1 kg 61.961 kg 67.586 kg     Body mass index is 20.01 kg/m.  General:  Well nourished, frail female, in no acute distress HEENT: normal Lymph: no adenopathy Neck: + JVD Endocrine:  No thryomegaly Vascular: No carotid bruits; pedal pulses 1+ bilaterally legs are cold but she tells me she has been in chair and that is why cold, otherwise she is warm  Cardiac:  normal S1, S2; RRR; no murmur gallup rub or click Lungs:  Scattered rales to auscultation bilaterally, no wheezing, rhonchi Abd: soft, nontender, no hepatomegaly  Ext: no edema Musculoskeletal:  No deformities, BUE and BLE strength normal and equal Skin: warm and dry except legs Neuro:  no focal abnormalities noted A&O X 3 MAE follows commands Psych:  Normal affect    Relevant CV Studies: Echo 09/20/19  When compared to 2014 no MS at that time, trivial MR present.  And trivial AR no stenosis 2020 IMPRESSIONS    1. There is moderate to severe mitral regurgitation present (likely severe). The mitral valve annulus and PMVL are heavily calcified consistent with degenerative mitral valve disease. The regurgitation is poorly interrogated on this study. The  regurgitant jet is posteriorly directed, reaching  the pulmonary veins. Would consider a TEE for better quantification.  2. A mild to moderate degree of aortic stenosis is present. The Vmax is 2.1 m/s, and mean gradient 10.3 mmHG. AVA by continuity is 1.20 cm2. The DI is 0.47. The peak velocity and gradients are underestimated due to low SV (SV=42 cc; SVi=25 cc/m2). I  suspect at least moderate AS is present.  3. Left ventricular ejection fraction, by visual estimation, is 60 to 65%. The left ventricle has normal function. There is mildly increased left ventricular hypertrophy.  4. Left ventricular diastolic function could not be evaluated.  5. Global right ventricle has normal systolic function.The right ventricular size is normal. No increase in right ventricular wall thickness.  6. Left atrial size was severely dilated.  7. Right atrial size was normal.  8. Presence of pericardial fat pad.  9. Trivial pericardial effusion is present. 10. Moderate calcification of the mitral valve leaflet(s). 11. Moderate thickening of the mitral valve leaflet(s). 12. Moderate to severe mitral annular calcification. 13. The mitral valve is degenerative. Moderate to severe mitral valve regurgitation. 14. The tricuspid valve is grossly normal. Tricuspid valve regurgitation is trivial. 15. The aortic valve is tricuspid. Aortic valve regurgitation is not visualized. Mild to moderate aortic valve stenosis. 16. There is Severe calcifcation of the aortic valve. 17. There is Severely thickening of the aortic valve. 18. The pulmonic valve was grossly normal. Pulmonic valve regurgitation is not visualized. 19. Normal pulmonary artery systolic pressure. 20. The tricuspid regurgitant velocity is 2.51 m/s, and with an assumed right atrial pressure of 8 mmHg, the estimated right ventricular systolic pressure is normal at 33.2 mmHg. 21. The inferior vena cava is normal in size with <50% respiratory variability, suggesting right atrial pressure of 8 mmHg. 22. Small left  ventricular internal cavity size.  FINDINGS  Left Ventricle: Left ventricular ejection fraction, by visual estimation, is 60 to 65%. The left ventricle has normal function. The left ventricular internal cavity size was the LV cavity size is small. There is mildly increased left ventricular  hypertrophy. Concentric left ventricular hypertrophy. The left ventricular diastology could not be evaluated due to atrial fibrillation. Left ventricular diastolic function could not be evaluated.  Right Ventricle: The right ventricular size is normal. No increase in right ventricular wall thickness. Global RV systolic function is has normal systolic function. The tricuspid regurgitant velocity is 2.51 m/s, and with an assumed right atrial pressure  of 8 mmHg, the estimated right ventricular systolic pressure is normal at 33.2 mmHg.  Left Atrium: Left atrial size was severely dilated.  Right Atrium: Right atrial size was normal in size  Pericardium: Trivial pericardial effusion is present. Presence of pericardial fat pad.  Mitral Valve: The mitral valve is degenerative in appearance. There is moderate thickening of the mitral valve leaflet(s). There is moderate calcification of the mitral valve leaflet(s). Moderate to severe mitral annular calcification. Moderate to severe  mitral valve regurgitation. There is moderate to severe mitral regurgitation present (likely severe). The mitral valve annulus and PMVL are heavily calcified consistent with degenerative mitral valve disease. The regurgitation is poorly interrogated on  this study. The regurgitant jet is posteriorly directed, reaching the pulmonary  veins. Would consider a TEE for better quantification.  Tricuspid Valve: The tricuspid valve is grossly normal. Tricuspid valve regurgitation is trivial.  Aortic Valve: The aortic valve is tricuspid. . There is Severely thickening and Severe calcifcation of the aortic valve. Aortic valve regurgitation is  not visualized. Mild to moderate aortic stenosis is present. There is Severely thickening of the aortic  valve. Severe calcifcation. Aortic valve mean gradient measures 10.3 mmHg. Aortic valve peak gradient measures 17.0 mmHg. Aortic valve area, by VTI measures 1.20 cm. A mild to moderate degree of aortic stenosis is present. The Vmax is 2.1 m/s, and mean  gradient 10.3 mmHG. AVA by continuity is 1.20 cm2. The DI is 0.47. The peak velocity and gradients are underestimated due to low SV (SV=42 cc; SVi=25 cc/m2). I suspect at least moderate AS is present.  Pulmonic Valve: The pulmonic valve was grossly normal. Pulmonic valve regurgitation is not visualized.  Aorta: The aortic root is normal in size and structure. The ascending aorta was not well visualized.  Venous: The inferior vena cava is normal in size with less than 50% respiratory variability, suggesting right atrial pressure of 8 mmHg.  IAS/Shunts: No atrial level shunt detected by color flow Doppler.       Laboratory Data:  High Sensitivity Troponin:   Recent Labs  Lab 10/03/2019 1113 09/12/2019 1609  TROPONINIHS 29* 30*     Chemistry Recent Labs  Lab 09/21/19 0347 09/22/19 1329 09/23/19 0439  NA 143 139 141  K 3.9 4.3 4.3  CL 106 99 102  CO2 23 24 22   GLUCOSE 101* 132* 114*  BUN 13 13 23   CREATININE 0.98 1.17* 0.96  CALCIUM 9.3 9.6 10.0  GFRNONAA 52* 42* 53*  GFRAA 60* 48* >60  ANIONGAP 14 16* 17*    Recent Labs  Lab 09/24/2019 1609  PROT 6.5  ALBUMIN 3.3*  AST 16  ALT 11  ALKPHOS 51  BILITOT 0.8   Hematology Recent Labs  Lab 09/21/19 0915 09/22/19 1329 09/23/19 0439  WBC 13.0* 18.4* 15.5*  RBC 4.35 4.46 4.36  HGB 13.3 13.4 13.2  HCT 40.5 42.6 41.2  MCV 93.1 95.5 94.5  MCH 30.6 30.0 30.3  MCHC 32.8 31.5 32.0  RDW 13.8 13.9 13.6  PLT 321 388 379   BNP Recent Labs  Lab 09/25/2019 1609  BNP 107.2*    DDimer  Recent Labs  Lab 09/28/2019 2256  DDIMER 1.61*     Radiology/Studies:  Dg  Chest 2 View  Result Date: 09/20/2019 CLINICAL DATA:  Pneumonia.  CHF. EXAM: CHEST - 2 VIEW COMPARISON:  07/20/2019.  02/27/2013. FINDINGS: Stable cardiomegaly. Diffuse multifocal bilateral pulmonary infiltrates again noted. Infiltrates are slightly increased in the upper lobes day's exam small left pleural effusion. Prominence sliding hiatal hernia again noted. Stable lower thoracic vertebral body compression fracture. Surgical clips right axilla. IMPRESSION: 1. Diffuse multifocal bilateral pulmonary infiltrates again noted pulmonary infiltrates have slightly increased in the upper lobes on today's exam. 2. Stable cardiomegaly. 3. Prominent hiatal hernia again noted. Electronically Signed   By: Marcello Moores  Register   On: 09/20/2019 14:08   Dg Chest Port 1 View  Result Date: 09/23/2019 CLINICAL DATA:  Respiratory failure. Diabetes and hypertension. EXAM: PORTABLE CHEST 1 VIEW COMPARISON:  09/22/2019 FINDINGS: Decreased lung volumes. Bilateral pleural effusion and pulmonary edema is noted. Multifocal bilateral airspace opacities throughout both lungs again noted. When compared with the previous exam there is been slight interval improvement in aeration to the right upper lobe. IMPRESSION:  1. Slight improvement in aeration to the right upper lobe. 2. Persistent bilateral airspace opacities compatible with multifocal pneumonia. 3. Persistent bilateral pleural effusions and pulmonary edema compatible with congestive heart failure. Electronically Signed   By: Kerby Moors M.D.   On: 09/23/2019 08:45   Dg Chest Port 1 View  Result Date: 09/22/2019 CLINICAL DATA:  Respiratory distress.  Aspiration into the airway. EXAM: PORTABLE CHEST 1 VIEW COMPARISON:  09/20/2019 FINDINGS: Stable cardiac enlargement. Bilateral pleural effusions and interstitial edema is unchanged. Multifocal bilateral airspace opacities are again noted particularly in the right upper lobe and left base. These are not significantly changed in  the interval. IMPRESSION: 1. No change in bilateral airspace opacities and changes of CHF. Electronically Signed   By: Kerby Moors M.D.   On: 09/22/2019 06:44   Dg Chest Portable 1 View  Result Date: 09/18/2019 CLINICAL DATA:  Hypoxia EXAM: PORTABLE CHEST 1 VIEW COMPARISON:  08/20/2014 FINDINGS: The heart size is enlarged. There are scattered bilateral airspace opacities, greatest within the perihilar distribution. There is a left lower lobe airspace opacity with a probable left-sided pleural effusion. There is no pneumothorax. There is no acute osseous abnormality. IMPRESSION: 1. Scattered bilateral airspace opacities, greatest within a perihilar distribution. Differential considerations include pulmonary edema or an atypical infectious process. Follow-up to resolution is recommended as an underlying pulmonary nodule cannot be excluded. 2. More focal consolidation is noted at the left lung base. 3. Probable small left-sided pleural effusion. 4. Cardiomegaly. Electronically Signed   By: Constance Holster M.D.   On: 10/05/2019 15:15   Dg Abd Portable 1v  Result Date: 09/21/2019 CLINICAL DATA:  Diarrhea, abdominal pain. EXAM: PORTABLE ABDOMEN - 1 VIEW COMPARISON:  January 24, 2010. FINDINGS: The bowel gas pattern is normal. No radio-opaque calculi or other significant radiographic abnormality are seen. IMPRESSION: No evidence of bowel obstruction or ileus. Electronically Signed   By: Marijo Conception M.D.   On: 09/21/2019 12:31    Assessment and Plan:   1. Atrial fib vs atrial tach and atrial tach with block. if a fib CHA2DS2VAsc of 5  She was on lopressor 25 mg BID but stopped due to the slow HR and one dose of dilt 10 mg bolus. From her story she may be having these episodes at home and cause lightheadedness and causing her to have to sit.  no angina.  Will add IV heparin and ask EP to see in AM  2. Acute diastolic HF with normal EF and unsure how much her MR is playing a role,  She is + 408 if I&O  is accurate and no weights.  She is now on lasix 40 mg IV BID now and has rec'd total of 80 mg since admit.   3. Possible HCAP on ABX per IM along with steroids 4. Concern for Mobitz II but may be atrial tach with block 5. HTN controlled.  6. Possible severe MR per MD      For questions or updates, please contact [place] Please consult [email or web address] for contact info under     Signed, Cecilie Kicks, NP  09/23/2019 10:07 AM   The patient was seen and examined, and I agree with the history, physical exam, assessment and plan as documented above, with modifications as noted below. I have also personally reviewed all relevant documentation, old records, labs, and both radiographic and cardiovascular studies. I have also independently interpreted old and new ECG's.  Briefly, this is an 85 year old woman with a history  of hypertension and dementia who has been hospitalized with acute diastolic heart failure.  Echocardiogram also demonstrated severe mitral vegetation.  She developed tachycardic episodes with evidence for rapid atrial fibrillation.  She was given IV diltiazem metoprolol but then developed some heart block.  There is evidence for atrial tachycardia as well.  She describes episodes of lightheadedness and dizziness at home but denies syncope.  She associates this with rapid heart rates.  She currently denies chest pain and denies shortness of breath for me.  She has been started on IV Lasix 40 mg twice daily by internal medicine.  She is also being treated for possible community-acquired pneumonia.  I spoke with EP who suggests she may need pacing given tachycardia-bradycardia syndrome.  While she is elderly with some mild dementia, she may be a candidate for MitraClip given severe mitral vegetation which is also likely contributing to decompensated heart failure.  We will start IV heparin.  Most recent ECG demonstrates sinus rhythm.  If she becomes tachycardic again I  would recommend IV amiodarone for heart rate control.  We will ask EP to formally see tomorrow.  Kate Sable, MD, [facility]  09/23/2019 12:18 PM

## 2019-09-23 NOTE — Progress Notes (Signed)
ANTICOAGULATION CONSULT NOTE - Initial Consult  Pharmacy Consult for heparin Indication: atrial fibrillation  Allergies  Allergen Reactions  . Codeine Nausea And Vomiting  . Macrobid [Nitrofurantoin Macrocrystal] Other (See Comments)    Dizziness.    Patient Measurements: Height: 5' 6.5" (168.9 cm) Weight: 125 lb 14.1 oz (57.1 kg) IBW/kg (Calculated) : 60.45 Heparin Dosing Weight: 57.1 kg  Vital Signs: Temp: 98 F (36.7 C) (11/15 0736) Temp Source: Axillary (11/15 0736) BP: 95/62 (11/15 1100) Pulse Rate: 112 (11/15 0409)  Labs: Recent Labs    09/21/19 0347  09/21/19 0915 09/22/19 1329 09/23/19 0439  HGB  --    < > 13.3 13.4 13.2  HCT  --   --  40.5 42.6 41.2  PLT  --   --  [phone]  CREATININE 0.98  --   --  1.17* 0.96   < > = values in this interval not displayed.    Estimated Creatinine Clearance: 36.5 mL/min (by C-G formula based on SCr of 0.96 mg/dL).   Medical History: Past Medical History:  Diagnosis Date  . Breast cancer ([place])    Mastectomy and chemotherapy  . Diabetes mellitus ([place])    Diet controlled  . GERD (gastroesophageal reflux disease)   . Hyperlipidemia   . Hypertension   . Hypothyroid   . Lymphoma ([place])    Chemotherapy    Medications:  Scheduled:  . aspirin EC  81 mg Oral Daily  . azithromycin  500 mg Oral Daily  . docusate sodium  100 mg Oral BID  . donepezil  10 mg Oral QHS  . furosemide  40 mg Intravenous BID  . levalbuterol  0.63 mg Nebulization Q6H  . levothyroxine  100 mcg Oral Q0600  . methylPREDNISolone (SOLU-MEDROL) injection  40 mg Intravenous Daily  . sodium chloride flush  3 mL Intravenous Q12H  . venlafaxine XR  75 mg Oral Q breakfast  . vitamin B-12  1,000 mcg Oral Daily   Infusions:  . sodium chloride    . cefTRIAXone (ROCEPHIN)  IV Stopped (09/22/19 2317)    Assessment: 89yoF admitted for SOB with concerns for acute CHF vs. pneumonia. Found to have Afib vs. Atach and Atach with block. CHA2DS2VASc of 5. No  AC PTA. Pharmacy consulted for heparin dosing.  Pt was receiving enoxaparin 30 mg daily for VTE prophylaxis, last dose on 11/14 at 21:25. CBC wnl, Hgb 13.2, Plt 379. SCr stable at 0.96.  Goal of Therapy:  Heparin level 0.3-0.7 units/ml Monitor platelets by anticoagulation protocol: Yes   Plan:  No heparin bolus due to recent Lovenox dose Heparin infusion at 800 units/hr (~14 units/kg/hr) Heparin level in 8 hours Monitor daily heparin level, CBC, and s/sx of bleeding  Berenice Bouton, PharmD PGY1 Pharmacy Resident Office phone: [phone] Phone until 3:30 pm: [phone] 09/23/2019,11:48 AM

## 2019-09-23 NOTE — Evaluation (Signed)
Clinical/Bedside Swallow Evaluation Patient Details  Name: Janet Reeves MRN: [id number] Date of Birth: 1929-03-24  Today's Date: 09/23/2019 Time: SLP Start Time (ACUTE ONLY): 1406 SLP Stop Time (ACUTE ONLY): 1422 SLP Time Calculation (min) (ACUTE ONLY): 16 min  Past Medical History:  Past Medical History:  Diagnosis Date  . Breast cancer ([place])    Mastectomy and chemotherapy  . Diabetes mellitus ([place])    Diet controlled  . GERD (gastroesophageal reflux disease)   . Hyperlipidemia   . Hypertension   . Hypothyroid   . Lymphoma [facility])    Chemotherapy   Past Surgical History:  Past Surgical History:  Procedure Laterality Date  . APPENDECTOMY    . CHOLECYSTECTOMY    . MASTECTOMY    . TONSILLECTOMY     HPI:  Janet Reeves is a 85 y.o. female with a hx of breast cancer, DM-2 diet controlled, HLD, HTN hypothyroid and lymphoma admitted with SOB x 2 weeks. Dx with acute diastolic heart failure and possible HCAP.    Assessment / Plan / Recommendation Clinical Impression  Patient presents with a functional appearing oropharyngeal swallow with largely intact airway protection. Delayed throat clear noted with thin liquid x 1 at the end of exam but otherwise, patient without overt s/s of aspiration. Daughter did report however occassional coughing episodes and general worsening of condition since admission. In addition, patient with dx of dementia and with increased WOB particularly during activity, both increasing aspiration risk. Recommend continuation of diet with general safe swallowing precautions, and MBS 11/16 to ensure least restrictive diet with lowest aspiration risk is being recomnmended. Daughter in agreement.  SLP Visit Diagnosis: Dysphagia, unspecified (R13.10)    Aspiration Risk  Mild aspiration risk    Diet Recommendation Thin liquid(full liquids per MD order)   Liquid Administration via: Straw;Cup Medication Administration: Whole meds with liquid Supervision: Patient able  to self feed;Full supervision/cueing for compensatory strategies Compensations: Slow rate;Small sips/bites Postural Changes: Seated upright at 90 degrees    Other  Recommendations Oral Care Recommendations: Oral care BID   Follow up Recommendations (TBD)        Swallow Study   General HPI: [name] is a 85 y.o. female with a hx of breast cancer, DM-2 diet controlled, HLD, HTN hypothyroid and lymphoma admitted with SOB x 2 weeks. Dx with acute diastolic heart failure and possible HCAP.  Type of Study: Bedside Swallow Evaluation Previous Swallow Assessment: none Diet Prior to this Study: Thin liquids(full liquid) Temperature Spikes Noted: No Respiratory Status: Nasal cannula History of Recent Intubation: No Behavior/Cognition: Alert;Cooperative Oral Cavity Assessment: Within Functional Limits Oral Care Completed by SLP: Recent completion by staff Oral Cavity - Dentition: Dentures, top(missing bottom dentures) Vision: Functional for self-feeding Self-Feeding Abilities: Able to feed self Baseline Vocal Quality: Hoarse Volitional Cough: Strong Volitional Swallow: Able to elicit    Oral/Motor/Sensory Function Overall Oral Motor/Sensory Function: Generalized oral weakness   Ice Chips Ice chips: Not tested   Thin Liquid Thin Liquid: Impaired Presentation: Cup;Straw Pharyngeal  Phase Impairments: Cough - Delayed    Nectar Thick Nectar Thick Liquid: Not tested   Honey Thick Honey Thick Liquid: Not tested   Puree Puree: Within functional limits Presentation: Spoon   Solid     Solid: Within functional limits     Janet Gardenhire MA, CCC-SLP   Janet Reeves Janet Reeves 09/23/2019,2:32 PM

## 2019-09-23 NOTE — Progress Notes (Addendum)
PROGRESS NOTE    Janet Reeves  [id number] DOB: 12-11-1928 DOA: 09/25/2019 PCP: Jacklyn Shell, FNP   Brief Narrative: Janet Reeves is a 85 y.o. female with a history of hypertension, hypothyroidism, breast cancer, dementia. Patient presented secondary to shortness of breath with initial concern for pneumonia vs heart failure.   Assessment & Plan:   Active Problems:   Essential hypertension   Hypothyroidism   Breast cancer (HCC)   Healthcare-associated pneumonia   Acute CHF (congestive heart failure) (HCC)   Pneumonia   Acute diastolic heart failure Transthoracic Echocardiogram not suggestive of systolic heart failure. No mention of diastolic function. With elevated BNP and chest x-ray significant for infiltrates and cardiomegaly, will assume she has some diastolic dysfunction. Most recent chest x-ray more suggestive for evidence of heart failure. -Increase to 40 mg IV BID -PT recommendations: SNF -Strict in/out and daily weights  ?HCAP Possible diagnosis. Diagnosed on admission. Started on empiric Ceftriaxone and azithromycin. Procalcitonin undetectable. Wheezing on exam. Leukocytosis increased slightly initially after steroids, now back down. Respiratory distress this morning possibly precipitated by atrial fibrillation -Continue Ceftriaxone and Azithromycin -Solu-medrol, Xopenex scheduled, Lasix  Paroxysmal Atrial fibrillation Sinus tachycardia today. TSH obtained today and was low. Confirmed Synthroid dose of 112 mcg from ALF. Magnesium low. -Continue telemetry -Adjusted Synthroid  Second degree heart block, mobitz II -Will consult cardiology -Discontinue metoprolol  Essential hypertension Well controlled. Not on medication  Hypothyroidism TSH low. Patient receiving Synthroid 112 mcg at ALF. Per review, dose increased from 100 mcg to 112 mcg on 05/19/2019. Most recent TSH from ALF was 7.51 on 07/05/2019. Numbers are slightly difficult to interpret, but since she  has a low TSH at present with tachycardic episodes, will decrease Synthroid dose  -Continue decreased dose of Synthroid 100 mcg daily  Moderate aortic stenosis New diagnosis. Cardiology consulted for above  Moderate/severe mitral valve regurgitation New diagnosis seen on Transthoracic Echocardiogram. In setting of degenerative heart valve. Cardiology consulted for above.  History of breast cancer Outpatient follow-up with oncology/palliative care  Dementia -Continue Aricept  Abdominal pain Patient reports generalized abdominal pain that improved with bowel movement. -Abdominal x-ray  Positive blood culture 2/4 positive for GPC. Final result is coagulase negative staph. Likely contamination.   DVT prophylaxis: Lovenox Code Status:   Code Status: DNR Family Communication: Called daughter, no answer. Disposition Plan: Discharge to SNF/ALF when medically stable for discharge (pending culture data, improvement of symptoms, wean to room air in addition to management of acute heart failure/arrhythmia)   Consultants:   Cardiology  Procedures:   11/12: Transthoracic Echocardiogram IMPRESSIONS    1. There is moderate to severe mitral regurgitation present (likely severe). The mitral valve annulus and PMVL are heavily calcified consistent with degenerative mitral valve disease. The regurgitation is poorly interrogated on this study. The  regurgitant jet is posteriorly directed, reaching the pulmonary veins. Would consider a TEE for better quantification.  2. A mild to moderate degree of aortic stenosis is present. The Vmax is 2.1 m/s, and mean gradient 10.3 mmHG. AVA by continuity is 1.20 cm2. The DI is 0.47. The peak velocity and gradients are underestimated due to low SV (SV=42 cc; SVi=25 cc/m2). I  suspect at least moderate AS is present.  3. Left ventricular ejection fraction, by visual estimation, is 60 to 65%. The left ventricle has normal function. There is mildly increased  left ventricular hypertrophy.  4. Left ventricular diastolic function could not be evaluated.  5. Global right ventricle has normal systolic function.The  right ventricular size is normal. No increase in right ventricular wall thickness.  6. Left atrial size was severely dilated.  7. Right atrial size was normal.  8. Presence of pericardial fat pad.  9. Trivial pericardial effusion is present. 10. Moderate calcification of the mitral valve leaflet(s). 11. Moderate thickening of the mitral valve leaflet(s). 12. Moderate to severe mitral annular calcification. 13. The mitral valve is degenerative. Moderate to severe mitral valve regurgitation. 14. The tricuspid valve is grossly normal. Tricuspid valve regurgitation is trivial. 15. The aortic valve is tricuspid. Aortic valve regurgitation is not visualized. Mild to moderate aortic valve stenosis. 16. There is Severe calcifcation of the aortic valve. 17. There is Severely thickening of the aortic valve. 18. The pulmonic valve was grossly normal. Pulmonic valve regurgitation is not visualized. 19. Normal pulmonary artery systolic pressure. 20. The tricuspid regurgitant velocity is 2.51 m/s, and with an assumed right atrial pressure of 8 mmHg, the estimated right ventricular systolic pressure is normal at 33.2 mmHg. 21. The inferior vena cava is normal in size with <50% respiratory variability, suggesting right atrial pressure of 8 mmHg. 22. Small left ventricular internal cavity size.  Antimicrobials:  Ceftriaxone  Azithromycin    Subjective: Patient reports no chest pain or dyspnea.  Objective: Vitals:   09/23/19 0001 09/23/19 0230 09/23/19 0409 09/23/19 0736  BP: (!) 118/92  107/81 112/84  Pulse: (!) 112 (!) 110 (!) 112   Resp: (!) 31 (!) 30 20   Temp: 97.8 F (36.6 C)  98.1 F (36.7 C) 98 F (36.7 C)  TempSrc: Oral  Oral Axillary  SpO2: 99%  96% 98%  Weight:      Height:        Intake/Output Summary (Last 24 hours) at  09/23/2019 [id number] Last data filed at 09/22/2019 2130 Gross per 24 hour  Intake 3 ml  Output --  Net 3 ml   Filed Weights   09/29/2019 2227  Weight: 57.1 kg    Examination:  General exam: Appears calm and comfortable  Respiratory system: Diminished with mild wheezing but improved from yesterday. Respiratory effort increased but improved slightly Cardiovascular system: S1 & S2 heard, RRR. No murmurs, rubs, gallops or clicks. Gastrointestinal system: Abdomen is nondistended, soft and nontender. No organomegaly or masses felt. Normal bowel sounds heard. Central nervous system: Alert and oriented. No focal neurological deficits. Extremities: No edema. No calf tenderness Skin: No cyanosis. No rashes Psychiatry: Judgement and insight appear normal. Mood & affect appropriate.     Data Reviewed: I have personally reviewed following labs and imaging studies  CBC: Recent Labs  Lab 10/06/2019 1113 09/21/19 0915 09/22/19 1329 09/23/19 0439  WBC 11.8* 13.0* 18.4* 15.5*  NEUTROABS 10.2*  --   --   --   HGB 14.4 13.3 13.4 13.2  HCT 43.8 40.5 42.6 41.2  MCV 93.2 93.1 95.5 94.5  PLT PLATELET CLUMPS NOTED ON SMEAR, COUNT APPEARS ADEQUATE [phone] [id number]   Basic Metabolic Panel: Recent Labs  Lab 09/11/2019 1609 09/20/19 1845 09/21/19 0347 09/22/19 1329 09/23/19 0439  NA 138  --  143 139 141  K 4.3  --  3.9 4.3 4.3  CL 103  --  106 99 102  CO2 18*  --  23 24 22   GLUCOSE 157*  --  101* 132* 114*  BUN 16  --  13 13 23   CREATININE 1.20*  --  0.98 1.17* 0.96  CALCIUM 9.9  --  9.3 9.6 10.0  MG  --  1.2* 1.6* 1.8  --    GFR: Estimated Creatinine Clearance: 36.5 mL/min (by C-G formula based on SCr of 0.96 mg/dL). Liver Function Tests: Recent Labs  Lab 10/02/2019 1609  AST 16  ALT 11  ALKPHOS 51  BILITOT 0.8  PROT 6.5  ALBUMIN 3.3*   No results for input(s): LIPASE, AMYLASE in the last 168 hours. No results for input(s): AMMONIA in the last 168 hours. Coagulation Profile: No results  for input(s): INR, PROTIME in the last 168 hours. Cardiac Enzymes: No results for input(s): CKTOTAL, CKMB, CKMBINDEX, TROPONINI in the last 168 hours. BNP (last 3 results) No results for input(s): PROBNP in the last 8760 hours. HbA1C: No results for input(s): HGBA1C in the last 72 hours. CBG: Recent Labs  Lab 09/22/19 0721  GLUCAP 188*   Lipid Profile: No results for input(s): CHOL, HDL, LDLCALC, TRIG, CHOLHDL, LDLDIRECT in the last 72 hours. Thyroid Function Tests: Recent Labs    09/21/19 0915  TSH 0.223*   Anemia Panel: No results for input(s): VITAMINB12, FOLATE, FERRITIN, TIBC, IRON, RETICCTPCT in the last 72 hours. Sepsis Labs: Recent Labs  Lab 10/08/2019 1114 10/06/2019 1609 09/29/2019 2256  PROCALCITON  --   --  <0.10  LATICACIDVEN 2.4* 1.9  --     Recent Results (from the past 240 hour(s))  SARS CORONAVIRUS 2 (TAT 6-24 HRS) Nasopharyngeal Nasopharyngeal Swab     Status: None   Collection Time: 09/18/2019  4:16 PM   Specimen: Nasopharyngeal Swab  Result Value Ref Range Status   SARS Coronavirus 2 NEGATIVE NEGATIVE Final    Comment: (NOTE) SARS-CoV-2 target nucleic acids are NOT DETECTED. The SARS-CoV-2 RNA is generally detectable in upper and lower respiratory specimens during the acute phase of infection. Negative results do not preclude SARS-CoV-2 infection, do not rule out co-infections with other pathogens, and should not be used as the sole basis for treatment or other patient management decisions. Negative results must be combined with clinical observations, patient history, and epidemiological information. The expected result is Negative. Fact Sheet for Patients: SugarRoll.be Fact Sheet for Healthcare Providers: https://www.woods-mathews.com/ This test is not yet approved or cleared by the [place] FDA and  has been authorized for detection and/or diagnosis of SARS-CoV-2 by FDA under an Emergency Use  Authorization (EUA). This EUA will remain  in effect (meaning this test can be used) for the duration of the COVID-19 declaration under Section 56 4(b)(1) of the Act, 21 U.S.C. section 360bbb-3(b)(1), unless the authorization is terminated or revoked sooner. Performed at [facility], [place] [address] [place], [place] [address]   Respiratory Panel by PCR     Status: None   Collection Time: 10/04/2019  4:43 PM   Specimen: Nasopharyngeal Swab; Respiratory  Result Value Ref Range Status   Adenovirus NOT DETECTED NOT DETECTED Final   Coronavirus 229E NOT DETECTED NOT DETECTED Final    Comment: (NOTE) The Coronavirus on the Respiratory Panel, DOES NOT test for the novel  Coronavirus (2019 nCoV)    Coronavirus HKU1 NOT DETECTED NOT DETECTED Final   Coronavirus NL63 NOT DETECTED NOT DETECTED Final   Coronavirus OC43 NOT DETECTED NOT DETECTED Final   Metapneumovirus NOT DETECTED NOT DETECTED Final   Rhinovirus / Enterovirus NOT DETECTED NOT DETECTED Final   Influenza A NOT DETECTED NOT DETECTED Final   Influenza B NOT DETECTED NOT DETECTED Final   Parainfluenza Virus 1 NOT DETECTED NOT DETECTED Final   Parainfluenza Virus 2 NOT DETECTED NOT DETECTED Final  Parainfluenza Virus 3 NOT DETECTED NOT DETECTED Final   Parainfluenza Virus 4 NOT DETECTED NOT DETECTED Final   Respiratory Syncytial Virus NOT DETECTED NOT DETECTED Final   Bordetella pertussis NOT DETECTED NOT DETECTED Final   Chlamydophila pneumoniae NOT DETECTED NOT DETECTED Final   Mycoplasma pneumoniae NOT DETECTED NOT DETECTED Final    Comment: Performed at [facility], [place] [address] [place], [place] [address]  Blood culture (routine x 2)     Status: None (Preliminary result)   Collection Time: 09/22/2019  4:52 PM   Specimen: BLOOD LEFT FOREARM  Result Value Ref Range Status   Specimen Description BLOOD LEFT FOREARM  Final   Special Requests   Final    BOTTLES DRAWN AEROBIC AND ANAEROBIC Blood Culture adequate  volume   Culture   Final    NO GROWTH 3 DAYS Performed at [facility], [place] [address], [place], [place] [address]    Report Status PENDING  Incomplete  Blood culture (routine x 2)     Status: Abnormal   Collection Time: 09/29/2019  4:52 PM   Specimen: BLOOD  Result Value Ref Range Status   Specimen Description BLOOD LEFT ANTECUBITAL  Final   Special Requests   Final    BOTTLES DRAWN AEROBIC AND ANAEROBIC Blood Culture adequate volume   Culture  Setup Time   Final    GRAM POSITIVE COCCI IN BOTH AEROBIC AND ANAEROBIC BOTTLES CRITICAL RESULT CALLED TO, READ BACK BY AND VERIFIED WITH: PHARM J FRENS [id number] AT 67 AM BY CM    Culture (A)  Final    STAPHYLOCOCCUS SPECIES (COAGULASE NEGATIVE) THE SIGNIFICANCE OF ISOLATING THIS ORGANISM FROM A SINGLE SET OF BLOOD CULTURES WHEN MULTIPLE SETS ARE DRAWN IS UNCERTAIN. PLEASE NOTIFY THE MICROBIOLOGY DEPARTMENT WITHIN ONE WEEK IF SPECIATION AND SENSITIVITIES ARE REQUIRED. Performed at [facility], [place] [address] [place], [place] [address]    Report Status 09/22/2019 FINAL  Final         Radiology Studies: Dg Chest Port 1 View  Result Date: 09/23/2019 CLINICAL DATA:  Respiratory failure. Diabetes and hypertension. EXAM: PORTABLE CHEST 1 VIEW COMPARISON:  09/22/2019 FINDINGS: Decreased lung volumes. Bilateral pleural effusion and pulmonary edema is noted. Multifocal bilateral airspace opacities throughout both lungs again noted. When compared with the previous exam there is been slight interval improvement in aeration to the right upper lobe. IMPRESSION: 1. Slight improvement in aeration to the right upper lobe. 2. Persistent bilateral airspace opacities compatible with multifocal pneumonia. 3. Persistent bilateral pleural effusions and pulmonary edema compatible with congestive heart failure. Electronically Signed   By: Kerby Moors M.D.   On: 09/23/2019 08:45   Dg Chest Port 1 View  Result Date: 09/22/2019 CLINICAL DATA:   Respiratory distress.  Aspiration into the airway. EXAM: PORTABLE CHEST 1 VIEW COMPARISON:  09/20/2019 FINDINGS: Stable cardiac enlargement. Bilateral pleural effusions and interstitial edema is unchanged. Multifocal bilateral airspace opacities are again noted particularly in the right upper lobe and left base. These are not significantly changed in the interval. IMPRESSION: 1. No change in bilateral airspace opacities and changes of CHF. Electronically Signed   By: Kerby Moors M.D.   On: 09/22/2019 06:44   Dg Abd Portable 1v  Result Date: 09/21/2019 CLINICAL DATA:  Diarrhea, abdominal pain. EXAM: PORTABLE ABDOMEN - 1 VIEW COMPARISON:  January 24, 2010. FINDINGS: The bowel gas pattern is normal. No radio-opaque calculi or other significant radiographic abnormality are seen. IMPRESSION: No evidence of bowel  obstruction or ileus. Electronically Signed   By: Marijo Conception M.D.   On: 09/21/2019 12:31        Scheduled Meds:  aspirin EC  81 mg Oral Daily   azithromycin  500 mg Oral Daily   docusate sodium  100 mg Oral BID   donepezil  10 mg Oral QHS   enoxaparin (LOVENOX) injection  30 mg Subcutaneous QHS   furosemide  40 mg Intravenous BID   levalbuterol  0.63 mg Nebulization Q6H   levothyroxine  100 mcg Oral Q0600   methylPREDNISolone (SOLU-MEDROL) injection  40 mg Intravenous Daily   metoprolol tartrate  25 mg Oral BID   sodium chloride flush  3 mL Intravenous Q12H   venlafaxine XR  75 mg Oral Q breakfast   vitamin B-12  1,000 mcg Oral Daily   Continuous Infusions:  sodium chloride     cefTRIAXone (ROCEPHIN)  IV Stopped (09/22/19 2317)     LOS: 4 days     Cordelia Poche, MD [facility] 09/23/2019, 9:53 AM  If 7PM-7AM, please contact night-coverage [email or web address]

## 2019-09-23 NOTE — Progress Notes (Addendum)
Pharmacy on phone: pt herapin lvl low, they will put order in to increase

## 2019-09-23 NOTE — Progress Notes (Signed)
   Vital Signs MEWS/VS Documentation      09/22/2019 2024 09/22/2019 2130 09/23/2019 0001 09/23/2019 0230   MEWS Score:  1  1  4  3    MEWS Score Color:  Green  Green  Red  Yellow   Resp:  18  -  (!) 31  (!) 30   Pulse:  (!) 103  -  (!) 112  (!) 110   BP:  105/77  -  (!) 118/92  -   Temp:  97.8 F (36.6 C)  -  97.8 F (36.6 C)  -   O2 Device:  HFNC  -  HFNC  HFNC   O2 Flow Rate (L/min):  -  -  -  5 L/min   Level of Consciousness:  -  Alert  -  -       No acute change, respirations and heart rate have been tachy during day and night shift. Pt is currently sleeping. Will continue to monitor.     Carri Spillers J Tamalyn Wadsworth 09/23/2019,2:49 AM

## 2019-09-24 ENCOUNTER — Inpatient Hospital Stay (HOSPITAL_COMMUNITY): Payer: Medicare Other

## 2019-09-24 LAB — CBC
HCT: 37.5 % (ref 36.0–46.0)
Hemoglobin: 12.4 g/dL (ref 12.0–15.0)
MCH: 30.3 pg (ref 26.0–34.0)
MCHC: 33.1 g/dL (ref 30.0–36.0)
MCV: 91.7 fL (ref 80.0–100.0)
Platelets: 399 10*3/uL (ref 150–400)
RBC: 4.09 MIL/uL (ref 3.87–5.11)
RDW: 13.4 % (ref 11.5–15.5)
WBC: 17.8 10*3/uL — ABNORMAL HIGH (ref 4.0–10.5)
nRBC: 0 % (ref 0.0–0.2)

## 2019-09-24 LAB — BASIC METABOLIC PANEL
Anion gap: 15 (ref 5–15)
BUN: 36 mg/dL — ABNORMAL HIGH (ref 8–23)
CO2: 24 mmol/L (ref 22–32)
Calcium: 10 mg/dL (ref 8.9–10.3)
Chloride: 102 mmol/L (ref 98–111)
Creatinine, Ser: 1.19 mg/dL — ABNORMAL HIGH (ref 0.44–1.00)
GFR calc Af Amer: 47 mL/min — ABNORMAL LOW (ref >60)
GFR calc non Af Amer: 41 mL/min — ABNORMAL LOW (ref >60)
Glucose, Bld: 107 mg/dL — ABNORMAL HIGH (ref 70–99)
Potassium: 4 mmol/L (ref 3.5–5.1)
Sodium: 141 mmol/L (ref 135–145)

## 2019-09-24 LAB — HEPARIN LEVEL (UNFRACTIONATED)
Heparin Unfractionated: 0.46 IU/mL (ref 0.30–0.70)
Heparin Unfractionated: 0.47 IU/mL (ref 0.30–0.70)

## 2019-09-24 LAB — CULTURE, BLOOD (ROUTINE X 2)
Culture: NO GROWTH
Special Requests: ADEQUATE

## 2019-09-24 MED ORDER — HALOPERIDOL LACTATE 5 MG/ML IJ SOLN
1.0000 mg | Freq: Once | INTRAMUSCULAR | Status: AC
  Administered 2019-09-24: 1 mg via INTRAVENOUS
  Filled 2019-09-24: qty 1

## 2019-09-24 MED ORDER — LORAZEPAM 0.5 MG PO TABS
0.5000 mg | ORAL_TABLET | Freq: Once | ORAL | Status: AC
  Administered 2019-09-24: 0.5 mg via ORAL
  Filled 2019-09-24: qty 1

## 2019-09-24 MED ORDER — HALOPERIDOL LACTATE 5 MG/ML IJ SOLN
1.5000 mg | Freq: Once | INTRAMUSCULAR | Status: AC
  Administered 2019-09-24: 1.5 mg via INTRAVENOUS
  Filled 2019-09-24: qty 1

## 2019-09-24 MED ORDER — FUROSEMIDE 10 MG/ML IJ SOLN
40.0000 mg | Freq: Two times a day (BID) | INTRAMUSCULAR | Status: DC
  Administered 2019-09-24: 40 mg via INTRAVENOUS
  Filled 2019-09-24 (×2): qty 4

## 2019-09-24 NOTE — Consult Note (Addendum)
ELECTROPHYSIOLOGY CONSULT NOTE    Patient ID: Janet Reeves MRN: [id number], DOB/AGE: Feb 27, 1929 85 y.o.  Admit date: 09/10/2019 Date of Consult: 09/24/2019  Primary Physician: Jacklyn Shell, [place] Primary Cardiologist: Stanford Breed Electrophysiologist: Lovena Le  Patient Profile: Janet Reeves is a 85 y.o. female with a history of breast cancer, diabetes, hypertension, hyperlipidemia who is being seen today for the evaluation of tachybrady at the request of Dr Bronson Ing.  HPI:  Janet Reeves is a 85 y.o. female with the above past medical history.  She was admitted with progressive shortness of breath on exertion for 2 weeks prior to admission.  She has been found to have atrial fibrillation with RVR as well as pauses of up to 3 seconds.   Echo this admission demonstrated severe MR, mild to moderate AS, EF 60-65%.  She is confused this morning with baseline dementia.  She tells me that she has not had dizziness, syncope or falls. She does have heart racing at home that lasts seconds. She says "please don't ask me any more questions".  I attempted to call her daughter today but she did not answer cell phone.   Telemetry in last 24 hours has shown rate controlled AF with nocturnal pauses.    Past Medical History:  Diagnosis Date   Breast cancer ([place])    Mastectomy and chemotherapy   Diabetes mellitus ([place])    Diet controlled   GERD (gastroesophageal reflux disease)    Hyperlipidemia    Hypertension    Hypothyroid    Lymphoma ([place])    Chemotherapy     Surgical History:  Past Surgical History:  Procedure Laterality Date   APPENDECTOMY     CHOLECYSTECTOMY     MASTECTOMY     TONSILLECTOMY       Medications Prior to Admission  Medication Sig Dispense Refill Last Dose   acetaminophen (TYLENOL) 500 MG tablet Take 1,000 mg by mouth 3 (three) times daily.   09/29/2019 at 0600   aspirin 81 MG tablet Take 81 mg by mouth daily.   09/17/2019 at 0600   Cholecalciferol  (VITAMIN D3) 5000 UNITS CAPS Take 5,000 Units by mouth daily.   09/09/2019 at 0600   donepezil (ARICEPT) 10 MG tablet Take 10 mg by mouth at bedtime.   09/18/2019 at 2000   levothyroxine (SYNTHROID) 112 MCG tablet Take 112 mcg by mouth daily.   09/11/2019 at 0600   Nutritional Supplements (ENSURE ORIGINAL) LIQD Take 237 mLs by mouth daily. Vanilla flavor at 2pm   09/18/2019 at 1400   potassium chloride (MICRO-K) 10 MEQ CR capsule Take 20 mEq by mouth daily.    09/24/2019 at 0800   venlafaxine XR (EFFEXOR-XR) 75 MG 24 hr capsule Take 75 mg by mouth daily.   09/27/2019 at 0600   vitamin B-12 (CYANOCOBALAMIN) 1000 MCG tablet Take 1,000 mcg by mouth daily.   09/23/2019 at 0600   citalopram (CELEXA) 20 MG tablet Take 1 tablet daily for depression (Patient not taking: Reported on 09/24/2019) 90 tablet 1 Not Taking at Unknown time   levothyroxine (SYNTHROID) 150 MCG tablet Take 1 tablet (150 mcg total) by mouth daily. (Patient not taking: Reported on 09/25/2019) 30 tablet 11 Not Taking at Unknown time    Inpatient Medications:   aspirin EC  81 mg Oral Daily   docusate sodium  100 mg Oral BID   donepezil  10 mg Oral QHS   furosemide  40 mg Intravenous BID   levalbuterol  0.63 mg Nebulization BID  levothyroxine  100 mcg Oral Q0600   methylPREDNISolone (SOLU-MEDROL) injection  40 mg Intravenous Daily   sodium chloride flush  3 mL Intravenous Q12H   venlafaxine XR  75 mg Oral Q breakfast   vitamin B-12  1,000 mcg Oral Daily    Allergies:  Allergies  Allergen Reactions   Codeine Nausea And Vomiting   Macrobid [Nitrofurantoin Macrocrystal] Other (See Comments)    Dizziness.    Social History   Socioeconomic History   Marital status: Widowed    Spouse name: Not on file   Number of children: 2   Years of education: Not on file   Highest education level: Not on file  Occupational History   Not on file  Social Needs   Financial resource strain: Not on file    Food insecurity    Worry: Not on file    Inability: Not on file   Transportation needs    Medical: Not on file    Non-medical: Not on file  Tobacco Use   Smoking status: Never Smoker   Smokeless tobacco: Never Used  Substance and Sexual Activity   Alcohol use: No   Drug use: No   Sexual activity: Not on file  Lifestyle   Physical activity    Days per week: Not on file    Minutes per session: Not on file   Stress: Not on file  Relationships   Social connections    Talks on phone: Not on file    Gets together: Not on file    Attends religious service: Not on file    Active member of club or organization: Not on file    Attends meetings of clubs or organizations: Not on file    Relationship status: Not on file   Intimate partner violence    Fear of current or ex partner: Not on file    Emotionally abused: Not on file    Physically abused: Not on file    Forced sexual activity: Not on file  Other Topics Concern   Not on file  Social History Narrative   Not on file     Family History  Problem Relation Age of Onset   Heart disease Father        Rhythm disturbance   Hypertension Father    Cancer Mother    Cancer Sister    Hypertension Daughter    Hyperlipidemia Daughter    Hypertension Sister    Stroke Sister      Review of Systems: All other systems reviewed and are otherwise negative except as noted above.  Physical Exam: Vitals:   09/24/19 0400 09/24/19 0500 09/24/19 0726 09/24/19 0752  BP: 103/74  110/78   Pulse:      Resp: 20  20   Temp:   (!) 97.3 F (36.3 C)   TempSrc:   Axillary   SpO2: 99%  95% 98%  Weight:  57.3 kg    Height:        GEN- The patient is elderly and frail appearing, pleasantly confused    HEENT: normocephalic, atraumatic; sclera clear, conjunctiva pink; hearing intact; oropharynx clear; neck supple Lungs- Clear to ausculation bilaterally, normal work of breathing.  No wheezes, rales, rhonchi Heart- Irregular  rate and rhythm  GI- soft, non-tender, non-distended, bowel sounds present Extremities- no clubbing, cyanosis, or edema  MS- no significant deformity or atrophy Skin- warm and dry, no rash or lesion Psych- euthymic mood, full affect Neuro- strength and sensation are intact  Labs:   Lab Results  Component Value Date   WBC 17.8 (H) 09/24/2019   HGB 12.4 09/24/2019   HCT 37.5 09/24/2019   MCV 91.7 09/24/2019   PLT 399 09/24/2019    Recent Labs  Lab 10/05/2019 1609  09/24/19 0712  NA 138   < > 141  K 4.3   < > 4.0  CL 103   < > 102  CO2 18*   < > 24  BUN 16   < > 36*  CREATININE 1.20*   < > 1.19*  CALCIUM 9.9   < > 10.0  PROT 6.5  --   --   BILITOT 0.8  --   --   ALKPHOS 51  --   --   ALT 11  --   --   AST 16  --   --   GLUCOSE 157*   < > 107*   < > = values in this interval not displayed.      Radiology/Studies: Dg Chest 2 View  Result Date: 09/20/2019 CLINICAL DATA:  Pneumonia.  CHF. EXAM: CHEST - 2 VIEW COMPARISON:  07/20/2019.  02/27/2013. FINDINGS: Stable cardiomegaly. Diffuse multifocal bilateral pulmonary infiltrates again noted. Infiltrates are slightly increased in the upper lobes day's exam small left pleural effusion. Prominence sliding hiatal hernia again noted. Stable lower thoracic vertebral body compression fracture. Surgical clips right axilla. IMPRESSION: 1. Diffuse multifocal bilateral pulmonary infiltrates again noted pulmonary infiltrates have slightly increased in the upper lobes on today's exam. 2. Stable cardiomegaly. 3. Prominent hiatal hernia again noted. Electronically Signed   By: Marcello Moores  Register   On: 09/20/2019 14:08   Dg Chest Port 1 View  Result Date: 09/23/2019 CLINICAL DATA:  Respiratory failure. Diabetes and hypertension. EXAM: PORTABLE CHEST 1 VIEW COMPARISON:  09/22/2019 FINDINGS: Decreased lung volumes. Bilateral pleural effusion and pulmonary edema is noted. Multifocal bilateral airspace opacities throughout both lungs again noted. When  compared with the previous exam there is been slight interval improvement in aeration to the right upper lobe. IMPRESSION: 1. Slight improvement in aeration to the right upper lobe. 2. Persistent bilateral airspace opacities compatible with multifocal pneumonia. 3. Persistent bilateral pleural effusions and pulmonary edema compatible with congestive heart failure. Electronically Signed   By: Kerby Moors M.D.   On: 09/23/2019 08:45   Dg Chest Port 1 View  Result Date: 09/22/2019 CLINICAL DATA:  Respiratory distress.  Aspiration into the airway. EXAM: PORTABLE CHEST 1 VIEW COMPARISON:  09/20/2019 FINDINGS: Stable cardiac enlargement. Bilateral pleural effusions and interstitial edema is unchanged. Multifocal bilateral airspace opacities are again noted particularly in the right upper lobe and left base. These are not significantly changed in the interval. IMPRESSION: 1. No change in bilateral airspace opacities and changes of CHF. Electronically Signed   By: Kerby Moors M.D.   On: 09/22/2019 06:44   Dg Chest Portable 1 View  Result Date: 09/15/2019 CLINICAL DATA:  Hypoxia EXAM: PORTABLE CHEST 1 VIEW COMPARISON:  08/20/2014 FINDINGS: The heart size is enlarged. There are scattered bilateral airspace opacities, greatest within the perihilar distribution. There is a left lower lobe airspace opacity with a probable left-sided pleural effusion. There is no pneumothorax. There is no acute osseous abnormality. IMPRESSION: 1. Scattered bilateral airspace opacities, greatest within a perihilar distribution. Differential considerations include pulmonary edema or an atypical infectious process. Follow-up to resolution is recommended as an underlying pulmonary nodule cannot be excluded. 2. More focal consolidation is noted at the left lung base. 3. Probable small left-sided  pleural effusion. 4. Cardiomegaly. Electronically Signed   By: Constance Holster M.D.   On: 09/12/2019 15:15   Dg Abd Portable 1v  Result  Date: 09/21/2019 CLINICAL DATA:  Diarrhea, abdominal pain. EXAM: PORTABLE ABDOMEN - 1 VIEW COMPARISON:  January 24, 2010. FINDINGS: The bowel gas pattern is normal. No radio-opaque calculi or other significant radiographic abnormality are seen. IMPRESSION: No evidence of bowel obstruction or ileus. Electronically Signed   By: Marijo Conception M.D.   On: 09/21/2019 12:31    EKG:SR, rate 95 (personally reviewed)  TELEMETRY: SR, ST, intermittent AF (personally reviewed)   Assessment/Plan: 1.  Tachy brady syndrome The patient reports intermittent palpitations but no dizziness, syncope, or pre-syncope. With baseline dementia and co-morbidities including severe MR and moderate AS, would like to avoid procedures if possible.  CHADS2VASC is at least 5.  She has severe LA enlargement.  If she is not too high risk, would consider long term anticoagulation  2.  Severe MR Will ask general cardiology to see again tomorrow to help guide decisions regarding TEE/further evaluation.   3.  Acute diastolic heart failure Improved   Dr Lovena Le to see later today. I attempted to call her daughter but could not reach her by phone.      For questions or updates, please contact [place] Please consult [email or web address] for contact info under Cardiology/STEMI.  Signed, Chanetta Marshall, NP 09/24/2019 9:20 AM  EP Attending  Patient seen and examined. Agree with above. The patient severe MR and AS and diastolic heart failure in the setting of dementia which is fairly advanced. She has had atrial fib with a RVR and pauses of up to 3 seconds. She fortunately is minimally if at all symptomatic and does not want a PPM. She is sedentary. I would recommend watchful waiting for now.  Mikle Bosworth.D.

## 2019-09-24 NOTE — Progress Notes (Addendum)
Modified Barium Swallow Progress Note  Patient Details  Name: Janet Reeves MRN: [id number] Date of Birth: 03-Jul-1929  Today's Date: 09/24/2019  Modified Barium Swallow completed.  Full report located under Chart Review in the Imaging Section.  Brief recommendations include the following:  Clinical Impression   Pt presents with minimal oropharyngeal dysphagia with no laryngeal penetration or aspiration observed on today's examination.  Pt was observed with trials of thin liquid, puree, and soft solids.  Oral phase was remarkable for significantly prolonged mastication of soft solids and prolonged AP transport of puree trial.  Additionally, pt presented with decreased lingual control resulting in premature spillage of thin liquid to the pharynx and reduced lingual strength resulting in trace-mild oral residue.  Pharyngeal phase was remarkable for reduced BOT retraction and reduced hyolaryngeal excursion, resulting in trace vallecular residue.  Recommend diet upgrade to Dysphagia 2 (finely chopped) solids and continuation of thin liquid with the following precautions: 1) Small bites/sips 2) Slow rate of intake 3) Sit upright 90 degrees.  Pt would benefit from full supervision during meals to assist with self feeding and to cue for compensatory strategies.      Swallow Evaluation Recommendations       SLP Diet Recommendations: Dysphagia 2 (Fine chop) solids;Thin liquid   Liquid Administration via: Cup;Straw   Medication Administration: Whole meds with liquid or whole with puree (as pt prefers)    Supervision: Staff to assist with self feeding;Full supervision/cueing for compensatory strategies   Compensations: Slow rate;Small sips/bites   Postural Changes: Seated upright at 90 degrees   Oral Care Recommendations: Oral care BID       Colin Mulders M.S., CCC-SLP Acute Rehabilitation Services Office: [phone]  [name] [name] 09/24/2019,10:14 AM

## 2019-09-24 NOTE — Progress Notes (Signed)
ANTICOAGULATION CONSULT NOTE - [place] for heparin Indication: atrial fibrillation  Allergies  Allergen Reactions  . Codeine Nausea And Vomiting  . Macrobid [Nitrofurantoin Macrocrystal] Other (See Comments)    Dizziness.    Patient Measurements: Height: 5' 6.5" (168.9 cm) Weight: 126 lb 5.2 oz (57.3 kg) IBW/kg (Calculated) : 60.45 Heparin Dosing Weight: 57.1 kg  Vital Signs: Temp: 98.1 F (36.7 C) (11/16 1121) Temp Source: Oral (11/16 1121) BP: 129/88 (11/16 1121) Pulse Rate: 113 (11/16 1121)  Labs: Recent Labs    09/22/19 1329 09/23/19 0439 09/23/19 2048 09/24/19 0712 09/24/19 1521  HGB 13.4 13.2  --  12.4  --   HCT 42.6 41.2  --  37.5  --   PLT 388 379  --  399  --   HEPARINUNFRC  --   --  0.18* 0.46 0.47  CREATININE 1.17* 0.96  --  1.19*  --     Estimated Creatinine Clearance: 29.6 mL/min (A) (by C-G formula based on SCr of 1.19 mg/dL (H)).   Medical History: Past Medical History:  Diagnosis Date  . Breast cancer ([place])    Mastectomy and chemotherapy  . Diabetes mellitus ([place])    Diet controlled  . GERD (gastroesophageal reflux disease)   . Hyperlipidemia   . Hypertension   . Hypothyroid   . Lymphoma [facility])    Chemotherapy    Assessment: 85 yr old female admitted for SOB with concerns for acute CHF vs. pneumonia. Found to have Afib vs. Atach and Atach with block. CHA2DS2VASc of 5. No anticoagulation PTA. Pharmacy consulted for heparin dosing.  Confirmatory heparin level remains therapeutic at 0.47 units/ml on heparin 1000 units/hr. H/H, platelets WNL. Per RN, pt has a small amount of bleeding around IV site, but this is stable.  Goal of Therapy:  Heparin level 0.3-0.7 units/ml Monitor platelets by anticoagulation protocol: Yes   Plan:  Continue heparin infusion at 1000 units/hr Monitor daily heparin level and CBC while on heparin therapy Monitor for signs/symptoms of bleeding  Gillermina Hu, PharmD, BCPS,  [facility] Clinical Pharmacist 09/24/2019

## 2019-09-24 NOTE — Progress Notes (Signed)
1200 Pt is restless and pulling at IV. Reports pain everywhere. Tramadol given at 1229 for pain. Pt also consoled and given back rub to relax at 1242. Pt has fallen asleep.   Pt again at 1330 wakes up yelling and anxious. MD Lonny Prude, R contacted about patient at 1345. Received a new orders for oral Ativan once. Med given at 1440.  28 Pt is now asleep with daughter present in the room. Patient appears to be relaxed but has some increased RR. MD Nettey, R notified and aware of increased RR. Will continue to monitor and assess patient.   Emotional support provided for patient and daughter.

## 2019-09-24 NOTE — Progress Notes (Signed)
Pt began screaming that she wasn't sure what was wrong, but eventually was able to say that she had pain. Unable to express where it was, but eventually stated that it was everywhere. See eMar.

## 2019-09-24 NOTE — Progress Notes (Signed)
ANTICOAGULATION CONSULT NOTE - [place] for heparin Indication: atrial fibrillation  Allergies  Allergen Reactions  . Codeine Nausea And Vomiting  . Macrobid [Nitrofurantoin Macrocrystal] Other (See Comments)    Dizziness.    Patient Measurements: Height: 5' 6.5" (168.9 cm) Weight: 126 lb 5.2 oz (57.3 kg) IBW/kg (Calculated) : 60.45 Heparin Dosing Weight: 57.1 kg  Vital Signs: Temp: 97.3 F (36.3 C) (11/16 0726) Temp Source: Axillary (11/16 0726) BP: 110/78 (11/16 0726) Pulse Rate: 102 (11/15 2300)  Labs: Recent Labs    09/22/19 1329 09/23/19 0439 09/23/19 2048 09/24/19 0712  HGB 13.4 13.2  --  12.4  HCT 42.6 41.2  --  37.5  PLT 388 379  --  399  HEPARINUNFRC  --   --  0.18* 0.46  CREATININE 1.17* 0.96  --  1.19*    Estimated Creatinine Clearance: 29.6 mL/min (A) (by C-G formula based on SCr of 1.19 mg/dL (H)).   Medical History: Past Medical History:  Diagnosis Date  . Breast cancer ([place])    Mastectomy and chemotherapy  . Diabetes mellitus ([place])    Diet controlled  . GERD (gastroesophageal reflux disease)   . Hyperlipidemia   . Hypertension   . Hypothyroid   . Lymphoma ([place])    Chemotherapy    Medications:  Scheduled:  . aspirin EC  81 mg Oral Daily  . docusate sodium  100 mg Oral BID  . donepezil  10 mg Oral QHS  . furosemide  40 mg Intravenous BID  . levalbuterol  0.63 mg Nebulization BID  . levothyroxine  100 mcg Oral Q0600  . methylPREDNISolone (SOLU-MEDROL) injection  40 mg Intravenous Daily  . sodium chloride flush  3 mL Intravenous Q12H  . venlafaxine XR  75 mg Oral Q breakfast  . vitamin B-12  1,000 mcg Oral Daily   Infusions:  . sodium chloride    . cefTRIAXone (ROCEPHIN)  IV 1 g (09/23/19 1344)  . heparin 1,000 Units/hr (09/23/19 2235)    Assessment: 89yoF admitted for SOB with concerns for acute CHF vs. pneumonia. Found to have Afib vs. Atach and Atach with block. CHA2DS2VASc of 5. No AC PTA. Pharmacy  consulted for heparin dosing.  Heparin level is therapeutic at 0.46 after increase to Heparin IV 1000 units/hr.  CBC is within normal limits.  No bleeding reported.   Goal of Therapy:  Heparin level 0.3-0.7 units/ml Monitor platelets by anticoagulation protocol: Yes   Plan:  Conitnue heparin at 1000 units/hr Recheck heparin level in 8 hours to confirm.  Daily heparin level and CBC while on therapy   Sloan Leiter, PharmD, BCPS, BCCCP Clinical Pharmacist Please refer to [facility] for [facility] Pharmacy numbers 09/24/2019

## 2019-09-24 NOTE — Progress Notes (Signed)
Pt screaming "help me help me help me help me" but cannot verbalize what is wrong "I don't know I don't know I don't know" - pt repositioned to comfort, blankets adjusted, pt content and now resting comfortably.

## 2019-09-24 NOTE — Progress Notes (Addendum)
PROGRESS NOTE    Janet Reeves  [id number] DOB: 08-29-1929 DOA: 09/11/2019 PCP: Jacklyn Shell, FNP   Brief Narrative: Janet Reeves is a 85 y.o. female with a history of hypertension, hypothyroidism, breast cancer, dementia. Patient presented secondary to shortness of breath with initial concern for pneumonia vs heart failure.   Assessment & Plan:   Active Problems:   Essential hypertension   Hypothyroidism   Breast cancer (HCC)   Healthcare-associated pneumonia   Acute CHF (congestive heart failure) (HCC)   Pneumonia   Acute diastolic heart failure Transthoracic Echocardiogram not suggestive of systolic heart failure. No mention of diastolic function. With elevated BNP and chest x-ray significant for infiltrates and cardiomegaly, will assume she has some diastolic dysfunction. Most recent chest x-ray more suggestive for evidence of heart failure.  -PT recommendations: SNF -Strict in/out and daily weights -Hold Lasix this morning secondary to soft BP and BUN and creatinine increase  ?HCAP Possible diagnosis. Diagnosed on admission. Started on empiric Ceftriaxone and azithromycin. Procalcitonin undetectable. Wheezing on exam. Leukocytosis is now likely driven by steroids. Respiratory distress this morning possibly precipitated by atrial fibrillation -Continue Ceftriaxone and Azithromycin -Xopenex scheduled  Acute respiratory failure Secondary to above. -CT chest to help differentiate process  Paroxysmal Atrial fibrillation Tachybrady syndrome TSH obtained today and was low. Confirmed Synthroid dose of 112 mcg from ALF. EP consulted by cardiology -Continue telemetry -Adjusted Synthroid dose -EP recommendations: pending attending -Continue Heparin per cardiology  Essential hypertension Well controlled. Not on medication  Hypothyroidism TSH low. Patient receiving Synthroid 112 mcg at ALF. Per review, dose increased from 100 mcg to 112 mcg on 05/19/2019. Most recent  TSH from ALF was 7.51 on 07/05/2019. Numbers are slightly difficult to interpret, but since she has a low TSH at present with tachycardic episodes, will decrease Synthroid dose  -Continue decreased dose of Synthroid 100 mcg daily  Moderate aortic stenosis New diagnosis. Cardiology consulted for above  Moderate/severe mitral valve regurgitation New diagnosis seen on Transthoracic Echocardiogram. In setting of degenerative heart valve. Cardiology consulted for above.  History of breast cancer Outpatient follow-up with oncology/palliative care  Dementia -Continue Aricept  Abdominal pain Patient reports generalized abdominal pain that improved with bowel movement. -Abdominal x-ray  Positive blood culture 2/4 positive for GPC. Final result is coagulase negative staph. Likely contamination.   DVT prophylaxis: Heparin drip Code Status:   Code Status: DNR Family Communication: Called daughter again today, no answer Disposition Plan: Discharge to SNF/ALF when medically stable for discharge (pending culture data, improvement of symptoms, wean to room air in addition to management of acute heart failure/arrhythmia)   Consultants:   Cardiology  Electrophysiology  Procedures:   11/12: Transthoracic Echocardiogram IMPRESSIONS    1. There is moderate to severe mitral regurgitation present (likely severe). The mitral valve annulus and PMVL are heavily calcified consistent with degenerative mitral valve disease. The regurgitation is poorly interrogated on this study. The  regurgitant jet is posteriorly directed, reaching the pulmonary veins. Would consider a TEE for better quantification.  2. A mild to moderate degree of aortic stenosis is present. The Vmax is 2.1 m/s, and mean gradient 10.3 mmHG. AVA by continuity is 1.20 cm2. The DI is 0.47. The peak velocity and gradients are underestimated due to low SV (SV=42 cc; SVi=25 cc/m2). I  suspect at least moderate AS is present.  3. Left  ventricular ejection fraction, by visual estimation, is 60 to 65%. The left ventricle has normal function. There is mildly increased left ventricular  hypertrophy.  4. Left ventricular diastolic function could not be evaluated.  5. Global right ventricle has normal systolic function.The right ventricular size is normal. No increase in right ventricular wall thickness.  6. Left atrial size was severely dilated.  7. Right atrial size was normal.  8. Presence of pericardial fat pad.  9. Trivial pericardial effusion is present. 10. Moderate calcification of the mitral valve leaflet(s). 11. Moderate thickening of the mitral valve leaflet(s). 12. Moderate to severe mitral annular calcification. 13. The mitral valve is degenerative. Moderate to severe mitral valve regurgitation. 14. The tricuspid valve is grossly normal. Tricuspid valve regurgitation is trivial. 15. The aortic valve is tricuspid. Aortic valve regurgitation is not visualized. Mild to moderate aortic valve stenosis. 16. There is Severe calcifcation of the aortic valve. 17. There is Severely thickening of the aortic valve. 18. The pulmonic valve was grossly normal. Pulmonic valve regurgitation is not visualized. 19. Normal pulmonary artery systolic pressure. 20. The tricuspid regurgitant velocity is 2.51 m/s, and with an assumed right atrial pressure of 8 mmHg, the estimated right ventricular systolic pressure is normal at 33.2 mmHg. 21. The inferior vena cava is normal in size with <50% respiratory variability, suggesting right atrial pressure of 8 mmHg. 22. Small left ventricular internal cavity size.  Antimicrobials:  Ceftriaxone  Azithromycin    Subjective: Patient yelling for help. States she has pain everywhere. States she can't breath. Felt better after I stayed in the room with her and massaged her neck per request. This appears to have calmed her down and alleviate her anxiety. She begged me to stay in the room as she  appeared anxious. Nursing came into the room and adjusted her in her bed which also seemed to calm her.  Objective: Vitals:   09/24/19 0500 09/24/19 0726 09/24/19 0752 09/24/19 1121  BP:  110/78  129/88  Pulse:    (!) 113  Resp:  20  (!) 22  Temp:  (!) 97.3 F (36.3 C)  98.1 F (36.7 C)  TempSrc:  Axillary  Oral  SpO2:  95% 98%   Weight: 57.3 kg     Height:        Intake/Output Summary (Last 24 hours) at 09/24/2019 1137 Last data filed at 09/24/2019 1120 Gross per 24 hour  Intake 464.83 ml  Output 250 ml  Net 214.83 ml   Filed Weights   09/09/2019 2227 09/24/19 0500  Weight: 57.1 kg 57.3 kg    Examination:  General exam: Appears agitated and uncomfortable Respiratory system: Diminished, rales. Respiratory effort normal. Cardiovascular system: S1 & S2 heard, RRR.  Gastrointestinal system: Abdomen is nondistended, soft and nontender. No organomegaly or masses felt. Normal bowel sounds heard. Central nervous system: Alert and oriented. Extremities: No edema. No calf tenderness Skin: No cyanosis. No rashes Psychiatry: anxious    Data Reviewed: I have personally reviewed following labs and imaging studies  CBC: Recent Labs  Lab 09/18/2019 1113 09/21/19 0915 09/22/19 1329 09/23/19 0439 09/24/19 0712  WBC 11.8* 13.0* 18.4* 15.5* 17.8*  NEUTROABS 10.2*  --   --   --   --   HGB 14.4 13.3 13.4 13.2 12.4  HCT 43.8 40.5 42.6 41.2 37.5  MCV 93.2 93.1 95.5 94.5 91.7  PLT PLATELET CLUMPS NOTED ON SMEAR, COUNT APPEARS ADEQUATE 321 388 379 [id number]   Basic Metabolic Panel: Recent Labs  Lab 09/25/2019 1609 09/20/19 1845 09/21/19 0347 09/22/19 1329 09/23/19 0439 09/24/19 0712  NA 138  --  143 139 141 141  K 4.3  --  3.9 4.3 4.3 4.0  CL 103  --  106 99 102 102  CO2 18*  --  23 24 22 24   GLUCOSE 157*  --  101* 132* 114* 107*  BUN 16  --  13 13 23  36*  CREATININE 1.20*  --  0.98 1.17* 0.96 1.19*  CALCIUM 9.9  --  9.3 9.6 10.0 10.0  MG  --  1.2* 1.6* 1.8  --   --     GFR: Estimated Creatinine Clearance: 29.6 mL/min (A) (by C-G formula based on SCr of 1.19 mg/dL (H)). Liver Function Tests: Recent Labs  Lab 10/05/2019 1609  AST 16  ALT 11  ALKPHOS 51  BILITOT 0.8  PROT 6.5  ALBUMIN 3.3*   No results for input(s): LIPASE, AMYLASE in the last 168 hours. No results for input(s): AMMONIA in the last 168 hours. Coagulation Profile: No results for input(s): INR, PROTIME in the last 168 hours. Cardiac Enzymes: No results for input(s): CKTOTAL, CKMB, CKMBINDEX, TROPONINI in the last 168 hours. BNP (last 3 results) No results for input(s): PROBNP in the last 8760 hours. HbA1C: No results for input(s): HGBA1C in the last 72 hours. CBG: Recent Labs  Lab 09/22/19 0721  GLUCAP 188*   Lipid Profile: No results for input(s): CHOL, HDL, LDLCALC, TRIG, CHOLHDL, LDLDIRECT in the last 72 hours. Thyroid Function Tests: No results for input(s): TSH, T4TOTAL, FREET4, T3FREE, THYROIDAB in the last 72 hours. Anemia Panel: No results for input(s): VITAMINB12, FOLATE, FERRITIN, TIBC, IRON, RETICCTPCT in the last 72 hours. Sepsis Labs: Recent Labs  Lab 09/23/2019 1114 09/18/2019 1609 09/24/2019 2256  PROCALCITON  --   --  <0.10  LATICACIDVEN 2.4* 1.9  --     Recent Results (from the past 240 hour(s))  SARS CORONAVIRUS 2 (TAT 6-24 HRS) Nasopharyngeal Nasopharyngeal Swab     Status: None   Collection Time: 09/16/2019  4:16 PM   Specimen: Nasopharyngeal Swab  Result Value Ref Range Status   SARS Coronavirus 2 NEGATIVE NEGATIVE Final    Comment: (NOTE) SARS-CoV-2 target nucleic acids are NOT DETECTED. The SARS-CoV-2 RNA is generally detectable in upper and lower respiratory specimens during the acute phase of infection. Negative results do not preclude SARS-CoV-2 infection, do not rule out co-infections with other pathogens, and should not be used as the sole basis for treatment or other patient management decisions. Negative results must be combined with  clinical observations, patient history, and epidemiological information. The expected result is Negative. Fact Sheet for Patients: SugarRoll.be Fact Sheet for Healthcare Providers: https://www.woods-mathews.com/ This test is not yet approved or cleared by the [place] FDA and  has been authorized for detection and/or diagnosis of SARS-CoV-2 by FDA under an Emergency Use Authorization (EUA). This EUA will remain  in effect (meaning this test can be used) for the duration of the COVID-19 declaration under Section 56 4(b)(1) of the Act, 21 U.S.C. section 360bbb-3(b)(1), unless the authorization is terminated or revoked sooner. Performed at [facility], [place] [address] [place],  [address]   Respiratory Panel by PCR     Status: None   Collection Time: 09/27/2019  4:43 PM   Specimen: Nasopharyngeal Swab; Respiratory  Result Value Ref Range Status   Adenovirus NOT DETECTED NOT DETECTED Final   Coronavirus 229E NOT DETECTED NOT DETECTED Final    Comment: (NOTE) The Coronavirus on the Respiratory Panel, DOES NOT test for the novel  Coronavirus (2019 nCoV)    Coronavirus HKU1  NOT DETECTED NOT DETECTED Final   Coronavirus NL63 NOT DETECTED NOT DETECTED Final   Coronavirus OC43 NOT DETECTED NOT DETECTED Final   Metapneumovirus NOT DETECTED NOT DETECTED Final   Rhinovirus / Enterovirus NOT DETECTED NOT DETECTED Final   Influenza A NOT DETECTED NOT DETECTED Final   Influenza B NOT DETECTED NOT DETECTED Final   Parainfluenza Virus 1 NOT DETECTED NOT DETECTED Final   Parainfluenza Virus 2 NOT DETECTED NOT DETECTED Final   Parainfluenza Virus 3 NOT DETECTED NOT DETECTED Final   Parainfluenza Virus 4 NOT DETECTED NOT DETECTED Final   Respiratory Syncytial Virus NOT DETECTED NOT DETECTED Final   Bordetella pertussis NOT DETECTED NOT DETECTED Final   Chlamydophila pneumoniae NOT DETECTED NOT DETECTED Final   Mycoplasma pneumoniae NOT  DETECTED NOT DETECTED Final    Comment: Performed at [facility], [place] [address] [place], [place] [address]  Blood culture (routine x 2)     Status: None   Collection Time: 09/23/2019  4:52 PM   Specimen: BLOOD LEFT FOREARM  Result Value Ref Range Status   Specimen Description BLOOD LEFT FOREARM  Final   Special Requests   Final    BOTTLES DRAWN AEROBIC AND ANAEROBIC Blood Culture adequate volume   Culture   Final    NO GROWTH 5 DAYS Performed at [facility], [place] [address], [place], [place] [address]    Report Status 09/24/2019 FINAL  Final  Blood culture (routine x 2)     Status: Abnormal   Collection Time: 09/24/2019  4:52 PM   Specimen: BLOOD  Result Value Ref Range Status   Specimen Description BLOOD LEFT ANTECUBITAL  Final   Special Requests   Final    BOTTLES DRAWN AEROBIC AND ANAEROBIC Blood Culture adequate volume   Culture  Setup Time   Final    GRAM POSITIVE COCCI IN BOTH AEROBIC AND ANAEROBIC BOTTLES CRITICAL RESULT CALLED TO, READ BACK BY AND VERIFIED WITH: PHARM J FRENS [id number] AT 14 AM BY CM    Culture (A)  Final    STAPHYLOCOCCUS SPECIES (COAGULASE NEGATIVE) THE SIGNIFICANCE OF ISOLATING THIS ORGANISM FROM A SINGLE SET OF BLOOD CULTURES WHEN MULTIPLE SETS ARE DRAWN IS UNCERTAIN. PLEASE NOTIFY THE MICROBIOLOGY DEPARTMENT WITHIN ONE WEEK IF SPECIATION AND SENSITIVITIES ARE REQUIRED. Performed at [facility], [place] [address], [place], [place] [address]    Report Status 09/22/2019 FINAL  Final         Radiology Studies: Dg Chest 1 View  Result Date: 09/24/2019 CLINICAL DATA:  Shortness of breath. EXAM: CHEST  1 VIEW COMPARISON:  09/23/2019 FINDINGS: Contrast noted in the esophagus and also in a large hiatal hernia from a recent barium swallow. The cardiac silhouette, mediastinal and hilar contours are stable. There is tortuosity and calcification of the thoracic aorta. Persistent bilateral patchy nodular airspace disease and small bilateral  pleural effusions. IMPRESSION: \Persistent patchy bilateral nodular airspace process and bilateral pleural effusions. Findings likely due to infection. Could not exclude underlying metastatic disease. Electronically Signed   By: Marijo Sanes M.D.   On: 09/24/2019 09:47   Dg Chest Port 1 View  Result Date: 09/23/2019 CLINICAL DATA:  Respiratory failure. Diabetes and hypertension. EXAM: PORTABLE CHEST 1 VIEW COMPARISON:  09/22/2019 FINDINGS: Decreased lung volumes. Bilateral pleural effusion and pulmonary edema is noted. Multifocal bilateral airspace opacities throughout both lungs again noted. When compared with the previous exam there is been slight interval improvement in aeration to the right upper lobe.  IMPRESSION: 1. Slight improvement in aeration to the right upper lobe. 2. Persistent bilateral airspace opacities compatible with multifocal pneumonia. 3. Persistent bilateral pleural effusions and pulmonary edema compatible with congestive heart failure. Electronically Signed   By: Kerby Moors M.D.   On: 09/23/2019 08:45   Dg Swallowing Func-speech Pathology  Result Date: 09/24/2019 Objective Swallowing Evaluation: Type of Study: MBS-Modified Barium Swallow Study  Patient Details Name: AMAJAH DANDURAND MRN: [id number] Date of Birth: 1929-02-27 Today's Date: 09/24/2019 Time: SLP Start Time (ACUTE ONLY): 0903 -SLP Stop Time (ACUTE ONLY): 0920 SLP Time Calculation (min) (ACUTE ONLY): 17 min Past Medical History: Past Medical History: Diagnosis Date  Breast cancer ([place])   Mastectomy and chemotherapy  Diabetes mellitus ([place])   Diet controlled  GERD (gastroesophageal reflux disease)   Hyperlipidemia   Hypertension   Hypothyroid   Lymphoma ([place])   Chemotherapy Past Surgical History: Past Surgical History: Procedure Laterality Date  APPENDECTOMY    CHOLECYSTECTOMY    MASTECTOMY    TONSILLECTOMY   HPI: Janet Reeves is a 85 y.o. female with a hx of breast cancer, DM-2 diet controlled, HLD, HTN  hypothyroid and lymphoma admitted with SOB x 2 weeks. Dx with acute diastolic heart failure and possible HCAP.  Subjective: Pt was pleasant and cooperative. Assessment / Plan / Recommendation CHL IP CLINICAL IMPRESSIONS 09/24/2019 Clinical Impression Pt presents with minimal oropharyngeal dysphagia with no laryngeal penetration or aspiration observed on today's examination.  Pt was observed with trials of thin liquid, puree, and soft solids.  Oral phase was remarkable for significantly prolonged mastication of soft solids and prolonged AP transport of puree trials.  Additionally, pt presented with decreased lingual control resulting in premature spillage of thin liquid to the pharynx and reduced lingual strength resulting in trace-mild oral residue.  Pharyngeal phase was remarkable for reduced BOT retraction and reduced hyolaryngeal excursion, resulting in trace vallecular residue.  Recommend diet upgrade to Dysphagia 2 (finely chopped) solids and continuation of thin liquid with the following precautions: 1) Small bites/sips 2) Slow rate of intake 3) Sit upright 90 degrees.  Pt would benefit from full supervision during meals to assist with self feeding and to cue for compensatory strategies. SLP Visit Diagnosis Dysphagia, oropharyngeal phase (R13.12) Attention and concentration deficit following -- Frontal lobe and executive function deficit following -- Impact on safety and function Mild aspiration risk   CHL IP TREATMENT RECOMMENDATION 09/24/2019 Treatment Recommendations Therapy as outlined in treatment plan below   Prognosis 09/24/2019 Prognosis for Safe Diet Advancement Fair Barriers to Reach Goals Cognitive deficits Barriers/Prognosis Comment -- CHL IP DIET RECOMMENDATION 09/24/2019 SLP Diet Recommendations Dysphagia 2 (Fine chop) solids;Thin liquid Liquid Administration via Cup;Straw Medication Administration Whole meds with liquid Compensations Slow rate;Small sips/bites Postural Changes Seated upright at  90 degrees   CHL IP OTHER RECOMMENDATIONS 09/24/2019 Recommended Consults -- Oral Care Recommendations Oral care BID Other Recommendations --   CHL IP FOLLOW UP RECOMMENDATIONS 09/24/2019 Follow up Recommendations 24 hour supervision/assistance   CHL IP FREQUENCY AND DURATION 09/24/2019 Speech Therapy Frequency (ACUTE ONLY) min 1 x/week Treatment Duration 2 weeks      CHL IP ORAL PHASE 09/24/2019 Oral Phase Impaired Oral - Pudding Teaspoon -- Oral - Pudding Cup -- Oral - Honey Teaspoon -- Oral - Honey Cup -- Oral - Nectar Teaspoon -- Oral - Nectar Cup -- Oral - Nectar Straw -- Oral - Thin Teaspoon WFL Oral - Thin Cup WFL Oral - Thin Straw Premature spillage;Lingual/palatal residue Oral - Puree Weak  lingual manipulation;Delayed oral transit Oral - Mech Soft Decreased bolus cohesion;Delayed oral transit;Reduced posterior propulsion;Weak lingual manipulation;Impaired mastication Oral - Regular -- Oral - Multi-Consistency -- Oral - Pill -- Oral Phase - Comment --  CHL IP PHARYNGEAL PHASE 09/24/2019 Pharyngeal Phase Impaired Pharyngeal- Pudding Teaspoon -- Pharyngeal -- Pharyngeal- Pudding Cup -- Pharyngeal -- Pharyngeal- Honey Teaspoon -- Pharyngeal -- Pharyngeal- Honey Cup -- Pharyngeal -- Pharyngeal- Nectar Teaspoon -- Pharyngeal -- Pharyngeal- Nectar Cup -- Pharyngeal -- Pharyngeal- Nectar Straw -- Pharyngeal -- Pharyngeal- Thin Teaspoon Reduced tongue base retraction;Reduced anterior laryngeal mobility;Pharyngeal residue - valleculae Pharyngeal Material does not enter airway Pharyngeal- Thin Cup Delayed swallow initiation-vallecula;Reduced anterior laryngeal mobility;Reduced tongue base retraction;Pharyngeal residue - valleculae Pharyngeal Material does not enter airway Pharyngeal- Thin Straw Delayed swallow initiation-pyriform sinuses;Reduced anterior laryngeal mobility;Reduced tongue base retraction Pharyngeal Material does not enter airway Pharyngeal- Puree Reduced anterior laryngeal mobility;Reduced tongue base  retraction;Pharyngeal residue - valleculae Pharyngeal Material does not enter airway Pharyngeal- Mechanical Soft Delayed swallow initiation-vallecula;Reduced anterior laryngeal mobility;Reduced tongue base retraction;Pharyngeal residue - valleculae Pharyngeal Material does not enter airway Pharyngeal- Regular -- Pharyngeal -- Pharyngeal- Multi-consistency -- Pharyngeal -- Pharyngeal- Pill -- Pharyngeal -- Pharyngeal Comment --  CHL IP CERVICAL ESOPHAGEAL PHASE 09/24/2019 Cervical Esophageal Phase WFL Pudding Teaspoon -- Pudding Cup -- Honey Teaspoon -- Honey Cup -- Nectar Teaspoon -- Nectar Cup -- Nectar Straw -- Thin Teaspoon -- Thin Cup -- Thin Straw -- Puree -- Mechanical Soft -- Regular -- Multi-consistency -- Pill -- Cervical Esophageal Comment -- Colin Mulders., M.S., CCC-SLP Acute Rehabilitation Services Office: [phone] [name] [facility] 09/24/2019, 10:36 AM                   Scheduled Meds:  aspirin EC  81 mg Oral Daily   docusate sodium  100 mg Oral BID   donepezil  10 mg Oral QHS   levalbuterol  0.63 mg Nebulization BID   levothyroxine  100 mcg Oral Q0600   sodium chloride flush  3 mL Intravenous Q12H   venlafaxine XR  75 mg Oral Q breakfast   vitamin B-12  1,000 mcg Oral Daily   Continuous Infusions:  sodium chloride     cefTRIAXone (ROCEPHIN)  IV 1 g (09/23/19 1344)   heparin 1,000 Units/hr (09/23/19 2235)     LOS: 5 days     Cordelia Poche, MD [facility] 09/24/2019, 11:37 AM  If 7PM-7AM, please contact night-coverage [email or web address]

## 2019-09-25 ENCOUNTER — Inpatient Hospital Stay (HOSPITAL_COMMUNITY): Payer: Medicare Other

## 2019-09-25 DIAGNOSIS — I4821 Permanent atrial fibrillation: Secondary | ICD-10-CM

## 2019-09-25 LAB — CBC WITH DIFFERENTIAL/PLATELET
Abs Immature Granulocytes: 0.13 10*3/uL — ABNORMAL HIGH (ref 0.00–0.07)
Basophils Absolute: 0 10*3/uL (ref 0.0–0.1)
Basophils Relative: 0 %
Eosinophils Absolute: 0 10*3/uL (ref 0.0–0.5)
Eosinophils Relative: 0 %
HCT: 43.6 % (ref 36.0–46.0)
Hemoglobin: 13.9 g/dL (ref 12.0–15.0)
Immature Granulocytes: 1 %
Lymphocytes Relative: 3 %
Lymphs Abs: 0.7 10*3/uL (ref 0.7–4.0)
MCH: 30.5 pg (ref 26.0–34.0)
MCHC: 31.9 g/dL (ref 30.0–36.0)
MCV: 95.8 fL (ref 80.0–100.0)
Monocytes Absolute: 1.9 10*3/uL — ABNORMAL HIGH (ref 0.1–1.0)
Monocytes Relative: 8 %
Neutro Abs: 20.5 10*3/uL — ABNORMAL HIGH (ref 1.7–7.7)
Neutrophils Relative %: 88 %
Platelets: 426 10*3/uL — ABNORMAL HIGH (ref 150–400)
RBC: 4.55 MIL/uL (ref 3.87–5.11)
RDW: 13.5 % (ref 11.5–15.5)
WBC: 23.2 10*3/uL — ABNORMAL HIGH (ref 4.0–10.5)
nRBC: 0 % (ref 0.0–0.2)

## 2019-09-25 LAB — BASIC METABOLIC PANEL
Anion gap: 20 — ABNORMAL HIGH (ref 5–15)
BUN: 40 mg/dL — ABNORMAL HIGH (ref 8–23)
CO2: 25 mmol/L (ref 22–32)
Calcium: 10.6 mg/dL — ABNORMAL HIGH (ref 8.9–10.3)
Chloride: 99 mmol/L (ref 98–111)
Creatinine, Ser: 1.16 mg/dL — ABNORMAL HIGH (ref 0.44–1.00)
GFR calc Af Amer: 49 mL/min — ABNORMAL LOW (ref >60)
GFR calc non Af Amer: 42 mL/min — ABNORMAL LOW (ref >60)
Glucose, Bld: 113 mg/dL — ABNORMAL HIGH (ref 70–99)
Potassium: 4 mmol/L (ref 3.5–5.1)
Sodium: 144 mmol/L (ref 135–145)

## 2019-09-25 LAB — CBC
HCT: 42.2 % (ref 36.0–46.0)
Hemoglobin: 13.5 g/dL (ref 12.0–15.0)
MCH: 30.4 pg (ref 26.0–34.0)
MCHC: 32 g/dL (ref 30.0–36.0)
MCV: 95 fL (ref 80.0–100.0)
Platelets: 387 10*3/uL (ref 150–400)
RBC: 4.44 MIL/uL (ref 3.87–5.11)
RDW: 13.6 % (ref 11.5–15.5)
WBC: 26 10*3/uL — ABNORMAL HIGH (ref 4.0–10.5)
nRBC: 0.1 % (ref 0.0–0.2)

## 2019-09-25 LAB — HEPARIN LEVEL (UNFRACTIONATED): Heparin Unfractionated: 0.57 IU/mL (ref 0.30–0.70)

## 2019-09-25 MED ORDER — MORPHINE SULFATE (PF) 2 MG/ML IV SOLN
1.0000 mg | INTRAVENOUS | Status: DC | PRN
  Administered 2019-09-25 (×5): 1 mg via INTRAVENOUS
  Filled 2019-09-25 (×5): qty 1

## 2019-09-25 MED ORDER — MORPHINE 100MG IN NS 100ML (1MG/ML) PREMIX INFUSION
1.0000 mg/h | INTRAVENOUS | Status: DC
  Administered 2019-09-25: 1 mg/h via INTRAVENOUS
  Filled 2019-09-25: qty 100

## 2019-09-25 MED ORDER — ONDANSETRON HCL 4 MG/2ML IJ SOLN: 4.0000 mg | Freq: Four times a day (QID) | INTRAMUSCULAR | Status: DC | PRN

## 2019-09-25 MED ORDER — LORAZEPAM 2 MG/ML IJ SOLN
1.0000 mg | INTRAMUSCULAR | Status: DC | PRN
  Administered 2019-09-25 (×2): 1 mg via INTRAVENOUS
  Filled 2019-09-25 (×2): qty 1

## 2019-09-25 MED ORDER — ONDANSETRON 4 MG PO TBDP: 4.0000 mg | ORAL_TABLET | Freq: Four times a day (QID) | ORAL | Status: DC | PRN

## 2019-09-25 MED ORDER — LORAZEPAM 1 MG PO TABS: 1.0000 mg | ORAL_TABLET | ORAL | Status: DC | PRN

## 2019-09-25 MED ORDER — LORAZEPAM 2 MG/ML PO CONC: 1.0000 mg | ORAL | Status: DC | PRN

## 2019-09-25 NOTE — TOC Progression Note (Signed)
Transition of Care [facility]) - Progression Note    Patient Details  Name: Janet Reeves MRN: [id number] Date of Birth: [birth year]  Transition of Care [facility]) CM/SW [place], [place] Phone Number: 09/25/2019, 2:10 PM  Clinical Narrative:   CSW alerted by RN that plan has now changed to comfort care, family at bedside. CSW to follow for any disposition needs; none mentioned at this time.     Expected Discharge Plan: Assisted Living Barriers to Discharge: Continued Medical Work up  Expected Discharge Plan and Services Expected Discharge Plan: Assisted Living In-house Referral: Clinical Social Work Discharge Planning Services: NA Post Acute Care Choice: Home Health Living arrangements for the past 2 months: [place]                 DME Arranged: N/A DME Agency: NA       HH Arranged: NA HH Agency: NA         Social Determinants of Health (SDOH) Interventions    Readmission Risk Interventions No flowsheet data found.

## 2019-09-25 NOTE — Progress Notes (Signed)
PROGRESS NOTE    Janet Reeves  [id number] DOB: 31-Jul-1929 DOA: 09/24/2019 PCP: Jacklyn Shell, FNP   Brief Narrative: Janet Reeves is a 85 y.o. female with a history of hypertension, hypothyroidism, breast cancer, dementia. Patient presented secondary to shortness of breath with initial concern for pneumonia vs heart failure.   Assessment & Plan:   Active Problems:   Essential hypertension   Hypothyroidism   Breast cancer (HCC)   Healthcare-associated pneumonia   Acute CHF (congestive heart failure) (HCC)   Pneumonia   Acute diastolic heart failure Transthoracic Echocardiogram not suggestive of systolic heart failure. No mention of diastolic function. With elevated BNP and chest x-ray significant for infiltrates and cardiomegaly, will assume she has some diastolic dysfunction. Most recent chest x-ray more suggestive for evidence of heart failure.  -PT recommendations: SNF -Strict in/out and daily weights -Repeat BMP this afternoon   ?HCAP Possible diagnosis. Diagnosed on admission. Started on empiric Ceftriaxone and azithromycin. Procalcitonin undetectable. Wheezing on exam. Leukocytosis initially likely driven by steroids, now with significant increase. CT chest not suggestive of pneumonia but rather "innumerable" pulmonary nodules and masses concerning for metastatic cancer. -Continue Ceftriaxone and Azithromycin -Xopenex scheduled  Acute respiratory failure Secondary to above. Chest CT suggests metastatic cancer and is significant for bilateral pleural effusions -management of effusions as mentioned below  Pleural effusions Unsure if related to heart failure vs metastatic etiology. Either, they do not appear to be responding to Lasix therapy. -Will need to discuss with daughter whether or not we should proceed with thoracentesis. Patient does not appear significantly bothered and may be more appropriate for hospice care at this time -Palliative care  consult  Paroxysmal Atrial fibrillation Tachybrady syndrome TSH obtained today and was low. Confirmed Synthroid dose of 112 mcg from ALF. EP consulted by cardiology. Still with episodes of significant tachycardia -Continue telemetry -Adjusted Synthroid dose -EP recommendations: watchful waiting at this time -Continue Heparin per cardiology  Essential hypertension Well controlled. Not on medication  Hypothyroidism TSH low. Patient receiving Synthroid 112 mcg at ALF. Per review, dose increased from 100 mcg to 112 mcg on 05/19/2019. Most recent TSH from ALF was 7.51 on 07/05/2019. Numbers are slightly difficult to interpret, but since she has a low TSH at present with tachycardic episodes, will decrease Synthroid dose  -Continue decreased dose of Synthroid 100 mcg daily  Moderate aortic stenosis New diagnosis. Cardiology consulted for above  Moderate/severe mitral valve regurgitation New diagnosis seen on Transthoracic Echocardiogram. In setting of degenerative heart valve. Cardiology consulted for above.  History of breast cancer Outpatient follow-up with oncology/palliative care  Dementia -Continue Aricept  Abdominal pain Patient reports generalized abdominal pain that improved with bowel movement. Abdominal x-ray unremarkable.  Positive blood culture 2/4 positive for GPC. Final result is coagulase negative staph. Likely contamination.   DVT prophylaxis: Heparin drip Code Status:   Code Status: DNR Family Communication: Called daughter again today, no answer (third day in a row) Disposition Plan: Discharge to SNF/ALF when medically stable for discharge (pending culture data, improvement of symptoms, wean to room air in addition to management of acute heart failure/arrhythmia) vs pending [place]   Consultants:   Cardiology  Electrophysiology  Palliative care medicine  Procedures:   11/12: Transthoracic Echocardiogram IMPRESSIONS    1. There is moderate to severe  mitral regurgitation present (likely severe). The mitral valve annulus and PMVL are heavily calcified consistent with degenerative mitral valve disease. The regurgitation is poorly interrogated on this study. The  regurgitant jet is  posteriorly directed, reaching the pulmonary veins. Would consider a TEE for better quantification.  2. A mild to moderate degree of aortic stenosis is present. The Vmax is 2.1 m/s, and mean gradient 10.3 mmHG. AVA by continuity is 1.20 cm2. The DI is 0.47. The peak velocity and gradients are underestimated due to low SV (SV=42 cc; SVi=25 cc/m2). I  suspect at least moderate AS is present.  3. Left ventricular ejection fraction, by visual estimation, is 60 to 65%. The left ventricle has normal function. There is mildly increased left ventricular hypertrophy.  4. Left ventricular diastolic function could not be evaluated.  5. Global right ventricle has normal systolic function.The right ventricular size is normal. No increase in right ventricular wall thickness.  6. Left atrial size was severely dilated.  7. Right atrial size was normal.  8. Presence of pericardial fat pad.  9. Trivial pericardial effusion is present. 10. Moderate calcification of the mitral valve leaflet(s). 11. Moderate thickening of the mitral valve leaflet(s). 12. Moderate to severe mitral annular calcification. 13. The mitral valve is degenerative. Moderate to severe mitral valve regurgitation. 14. The tricuspid valve is grossly normal. Tricuspid valve regurgitation is trivial. 15. The aortic valve is tricuspid. Aortic valve regurgitation is not visualized. Mild to moderate aortic valve stenosis. 16. There is Severe calcifcation of the aortic valve. 17. There is Severely thickening of the aortic valve. 18. The pulmonic valve was grossly normal. Pulmonic valve regurgitation is not visualized. 19. Normal pulmonary artery systolic pressure. 20. The tricuspid regurgitant velocity is 2.51 m/s, and  with an assumed right atrial pressure of 8 mmHg, the estimated right ventricular systolic pressure is normal at 33.2 mmHg. 21. The inferior vena cava is normal in size with <50% respiratory variability, suggesting right atrial pressure of 8 mmHg. 22. Small left ventricular internal cavity size.  Antimicrobials:  Ceftriaxone  Azithromycin    Subjective: Patient states she has no concerns today. No dyspnea or chest pain. Wants to move back to bed from her chair  Objective: Vitals:   09/25/19 0056 09/25/19 0303 09/25/19 0721 09/25/19 0824  BP:   129/76   Pulse:      Resp: 19  (!) 23   Temp:  98.3 F (36.8 C) 97.9 F (36.6 C)   TempSrc:  Oral Oral   SpO2:   94% 100%  Weight:  55.6 kg    Height:        Intake/Output Summary (Last 24 hours) at 09/25/2019 1000 Last data filed at 09/24/2019 1600 Gross per 24 hour  Intake 50 ml  Output 50 ml  Net 0 ml   Filed Weights   10/04/2019 2227 09/24/19 0500 09/25/19 0303  Weight: 57.1 kg 57.3 kg 55.6 kg    Examination:  General exam: Appears agitated and uncomfortable Respiratory system: Diminished, rales. Respiratory effort normal. Cardiovascular system: S1 & S2 heard, RRR.  Gastrointestinal system: Abdomen is nondistended, soft and nontender. No organomegaly or masses felt. Normal bowel sounds heard. Central nervous system: Alert and oriented. Extremities: No edema. No calf tenderness Skin: No cyanosis. No rashes Psychiatry: anxious    Data Reviewed: I have personally reviewed following labs and imaging studies  CBC: Recent Labs  Lab 09/30/2019 1113 09/21/19 0915 09/22/19 1329 09/23/19 0439 09/24/19 0712 09/25/19 0409  WBC 11.8* 13.0* 18.4* 15.5* 17.8* 26.0*  NEUTROABS 10.2*  --   --   --   --   --   HGB 14.4 13.3 13.4 13.2 12.4 13.5  HCT 43.8 40.5 42.6  41.2 37.5 42.2  MCV 93.2 93.1 95.5 94.5 91.7 95.0  PLT PLATELET CLUMPS NOTED ON SMEAR, COUNT APPEARS ADEQUATE 321 388 379 399 [id number]   Basic Metabolic Panel: Recent  Labs  Lab 09/20/19 1845 09/21/19 0347 09/22/19 1329 09/23/19 0439 09/24/19 0712 09/25/19 0409  NA  --  143 139 141 141 144  K  --  3.9 4.3 4.3 4.0 4.0  CL  --  106 99 102 102 99  CO2  --  23 24 22 24 25   GLUCOSE  --  101* 132* 114* 107* 113*  BUN  --  13 13 23  36* 40*  CREATININE  --  0.98 1.17* 0.96 1.19* 1.16*  CALCIUM  --  9.3 9.6 10.0 10.0 10.6*  MG 1.2* 1.6* 1.8  --   --   --    GFR: Estimated Creatinine Clearance: 29.4 mL/min (A) (by C-G formula based on SCr of 1.16 mg/dL (H)). Liver Function Tests: Recent Labs  Lab 09/29/2019 1609  AST 16  ALT 11  ALKPHOS 51  BILITOT 0.8  PROT 6.5  ALBUMIN 3.3*   No results for input(s): LIPASE, AMYLASE in the last 168 hours. No results for input(s): AMMONIA in the last 168 hours. Coagulation Profile: No results for input(s): INR, PROTIME in the last 168 hours. Cardiac Enzymes: No results for input(s): CKTOTAL, CKMB, CKMBINDEX, TROPONINI in the last 168 hours. BNP (last 3 results) No results for input(s): PROBNP in the last 8760 hours. HbA1C: No results for input(s): HGBA1C in the last 72 hours. CBG: Recent Labs  Lab 09/22/19 0721  GLUCAP 188*   Lipid Profile: No results for input(s): CHOL, HDL, LDLCALC, TRIG, CHOLHDL, LDLDIRECT in the last 72 hours. Thyroid Function Tests: No results for input(s): TSH, T4TOTAL, FREET4, T3FREE, THYROIDAB in the last 72 hours. Anemia Panel: No results for input(s): VITAMINB12, FOLATE, FERRITIN, TIBC, IRON, RETICCTPCT in the last 72 hours. Sepsis Labs: Recent Labs  Lab 09/27/2019 1114 09/15/2019 1609 10/04/2019 2256  PROCALCITON  --   --  <0.10  LATICACIDVEN 2.4* 1.9  --     Recent Results (from the past 240 hour(s))  SARS CORONAVIRUS 2 (TAT 6-24 HRS) Nasopharyngeal Nasopharyngeal Swab     Status: None   Collection Time: 09/23/2019  4:16 PM   Specimen: Nasopharyngeal Swab  Result Value Ref Range Status   SARS Coronavirus 2 NEGATIVE NEGATIVE Final    Comment: (NOTE) SARS-CoV-2 target  nucleic acids are NOT DETECTED. The SARS-CoV-2 RNA is generally detectable in upper and lower respiratory specimens during the acute phase of infection. Negative results do not preclude SARS-CoV-2 infection, do not rule out co-infections with other pathogens, and should not be used as the sole basis for treatment or other patient management decisions. Negative results must be combined with clinical observations, patient history, and epidemiological information. The expected result is Negative. Fact Sheet for Patients: SugarRoll.be Fact Sheet for Healthcare Providers: https://www.woods-mathews.com/ This test is not yet approved or cleared by the [place] FDA and  has been authorized for detection and/or diagnosis of SARS-CoV-2 by FDA under an Emergency Use Authorization (EUA). This EUA will remain  in effect (meaning this test can be used) for the duration of the COVID-19 declaration under Section 56 4(b)(1) of the Act, 21 U.S.C. section 360bbb-3(b)(1), unless the authorization is terminated or revoked sooner. Performed at [facility], [place] [address], [place], [place] [address]   Respiratory Panel by PCR     Status: None   Collection Time: 09/25/2019  4:43 PM   Specimen: Nasopharyngeal Swab; Respiratory  Result Value Ref Range Status   Adenovirus NOT DETECTED NOT DETECTED Final   Coronavirus 229E NOT DETECTED NOT DETECTED Final    Comment: (NOTE) The Coronavirus on the Respiratory Panel, DOES NOT test for the novel  Coronavirus (2019 nCoV)    Coronavirus HKU1 NOT DETECTED NOT DETECTED Final   Coronavirus NL63 NOT DETECTED NOT DETECTED Final   Coronavirus OC43 NOT DETECTED NOT DETECTED Final   Metapneumovirus NOT DETECTED NOT DETECTED Final   Rhinovirus / Enterovirus NOT DETECTED NOT DETECTED Final   Influenza A NOT DETECTED NOT DETECTED Final   Influenza B NOT DETECTED NOT DETECTED Final   Parainfluenza Virus 1 NOT DETECTED NOT  DETECTED Final   Parainfluenza Virus 2 NOT DETECTED NOT DETECTED Final   Parainfluenza Virus 3 NOT DETECTED NOT DETECTED Final   Parainfluenza Virus 4 NOT DETECTED NOT DETECTED Final   Respiratory Syncytial Virus NOT DETECTED NOT DETECTED Final   Bordetella pertussis NOT DETECTED NOT DETECTED Final   Chlamydophila pneumoniae NOT DETECTED NOT DETECTED Final   Mycoplasma pneumoniae NOT DETECTED NOT DETECTED Final    Comment: Performed at [facility], [place]. [address], [place], [place] [address]  Blood culture (routine x 2)     Status: None   Collection Time: 09/11/2019  4:52 PM   Specimen: BLOOD LEFT FOREARM  Result Value Ref Range Status   Specimen Description BLOOD LEFT FOREARM  Final   Special Requests   Final    BOTTLES DRAWN AEROBIC AND ANAEROBIC Blood Culture adequate volume   Culture   Final    NO GROWTH 5 DAYS Performed at [facility], [place] [address] [place], [place] [address]    Report Status 09/24/2019 FINAL  Final  Blood culture (routine x 2)     Status: Abnormal   Collection Time: 09/24/2019  4:52 PM   Specimen: BLOOD  Result Value Ref Range Status   Specimen Description BLOOD LEFT ANTECUBITAL  Final   Special Requests   Final    BOTTLES DRAWN AEROBIC AND ANAEROBIC Blood Culture adequate volume   Culture  Setup Time   Final    GRAM POSITIVE COCCI IN BOTH AEROBIC AND ANAEROBIC BOTTLES CRITICAL RESULT CALLED TO, READ BACK BY AND VERIFIED WITH: PHARM J FRENS [id number] AT 34 AM BY CM    Culture (A)  Final    STAPHYLOCOCCUS SPECIES (COAGULASE NEGATIVE) THE SIGNIFICANCE OF ISOLATING THIS ORGANISM FROM A SINGLE SET OF BLOOD CULTURES WHEN MULTIPLE SETS ARE DRAWN IS UNCERTAIN. PLEASE NOTIFY THE MICROBIOLOGY DEPARTMENT WITHIN ONE WEEK IF SPECIATION AND SENSITIVITIES ARE REQUIRED. Performed at [facility], [place] [address] [place],  [address]    Report Status 09/22/2019 FINAL  Final         Radiology Studies: Dg Chest 1 View  Result Date:  09/24/2019 CLINICAL DATA:  Shortness of breath. EXAM: CHEST  1 VIEW COMPARISON:  09/23/2019 FINDINGS: Contrast noted in the esophagus and also in a large hiatal hernia from a recent barium swallow. The cardiac silhouette, mediastinal and hilar contours are stable. There is tortuosity and calcification of the thoracic aorta. Persistent bilateral patchy nodular airspace disease and small bilateral pleural effusions. IMPRESSION: \Persistent patchy bilateral nodular airspace process and bilateral pleural effusions. Findings likely due to infection. Could not exclude underlying metastatic disease. Electronically Signed   By: Marijo Sanes M.D.   On: 09/24/2019 09:47   Ct Chest Wo Contrast  Result Date: 09/24/2019  CLINICAL DATA:  Shortness of breath, pleural effusion EXAM: CT CHEST WITHOUT CONTRAST TECHNIQUE: Multidetector CT imaging of the chest was performed following the standard protocol without IV contrast. COMPARISON:  07/19/2008 FINDINGS: Cardiovascular: Heart is mildly enlarged. Densely calcified aortic valve and mitral valve. Aorta is tortuous, nonaneurysmal. Moderate aortic calcifications. Coronary artery calcifications. Mediastinum/Nodes: Large hiatal hernia. Small scattered mediastinal lymph nodes. No axillary adenopathy. Cannot assess the hila without IV contrast. Lungs/Pleura: Innumerable bilateral pulmonary nodules and masses throughout the lungs. Index right upper lobe mass measures up to 3.4 cm. Index right middle lobe mass measures up to 2.5 cm. Index left lower lobe lesion measures 2.5 cm. Associated moderate pleural effusions. Upper Abdomen: Imaging into the upper abdomen shows no acute findings. Musculoskeletal: Chest wall soft tissues are unremarkable. Moderate to severe compression fractures noted in the mid thoracic spine, lower thoracic spine and upper lumbar spine. These are age indeterminate. IMPRESSION: Innumerable bilateral pulmonary nodules and masses. Findings highly suspicious or  metastases. Moderate bilateral pleural effusions. Cardiomegaly, coronary artery disease. Large hiatal hernia. Multiple compression fractures in the mid and lower thoracic spine and upper lumbar spine. Aortic Atherosclerosis (ICD10-I70.0). Electronically Signed   By: Rolm Baptise M.D.   On: 09/24/2019 21:56   Dg Chest Port 1 View  Result Date: 09/24/2019 CLINICAL DATA:  Acute respiratory failure EXAM: PORTABLE CHEST 1 VIEW COMPARISON:  Same-day radiograph, CT 07/19/2008 FINDINGS: Redemonstrated multifocal interstitial and airspace opacities throughout the lungs. Septal and fissural thickening and cephalized vascularity with bilateral effusions suggests superimposed edema. Air-filled hiatal hernia is again noted. No residual contrast contained within the hernia. Mild cardiomegaly is similar to prior. The aorta is calcified and tortuous. Postsurgical changes are again noted in the right axilla. No acute osseous or soft tissue abnormality. High-riding appearance of the right humeral head may reflect some rotator cuff insufficiency. Degenerative changes are present in the imaged spine and shoulders. IMPRESSION: 1. Persistent bilateral airspace disease compatible with multifocal pneumonia. Similar to comparison accounting for differences in technique and lung volume. 2. Additional features of superimposed edema with bilateral effusions. 3. Large hiatal hernia. 4.  Aortic Atherosclerosis (ICD10-I70.0). Electronically Signed   By: Lovena Le M.D.   On: 09/24/2019 19:51   Dg Swallowing Func-speech Pathology  Result Date: 09/24/2019 Objective Swallowing Evaluation: Type of Study: MBS-Modified Barium Swallow Study  Patient Details Name: Janet Reeves MRN: [id number] Date of Birth: 1929-11-01 Today's Date: 09/24/2019 Time: SLP Start Time (ACUTE ONLY): 0903 -SLP Stop Time (ACUTE ONLY): 0920 SLP Time Calculation (min) (ACUTE ONLY): 17 min Past Medical History: Past Medical History: Diagnosis Date  Breast cancer ([place])    Mastectomy and chemotherapy  Diabetes mellitus ([place])   Diet controlled  GERD (gastroesophageal reflux disease)   Hyperlipidemia   Hypertension   Hypothyroid   Lymphoma ([place])   Chemotherapy Past Surgical History: Past Surgical History: Procedure Laterality Date  APPENDECTOMY    CHOLECYSTECTOMY    MASTECTOMY    TONSILLECTOMY   HPI: KHLOEI PALELLA is a 85 y.o. female with a hx of breast cancer, DM-2 diet controlled, HLD, HTN hypothyroid and lymphoma admitted with SOB x 2 weeks. Dx with acute diastolic heart failure and possible HCAP.  Subjective: Pt was pleasant and cooperative. Assessment / Plan / Recommendation CHL IP CLINICAL IMPRESSIONS 09/24/2019 Clinical Impression Pt presents with minimal oropharyngeal dysphagia with no laryngeal penetration or aspiration observed on today's examination.  Pt was observed with trials of thin liquid, puree, and soft solids.  Oral phase was remarkable for  significantly prolonged mastication of soft solids and prolonged AP transport of puree trials.  Additionally, pt presented with decreased lingual control resulting in premature spillage of thin liquid to the pharynx and reduced lingual strength resulting in trace-mild oral residue.  Pharyngeal phase was remarkable for reduced BOT retraction and reduced hyolaryngeal excursion, resulting in trace vallecular residue.  Recommend diet upgrade to Dysphagia 2 (finely chopped) solids and continuation of thin liquid with the following precautions: 1) Small bites/sips 2) Slow rate of intake 3) Sit upright 90 degrees.  Pt would benefit from full supervision during meals to assist with self feeding and to cue for compensatory strategies. SLP Visit Diagnosis Dysphagia, oropharyngeal phase (R13.12) Attention and concentration deficit following -- Frontal lobe and executive function deficit following -- Impact on safety and function Mild aspiration risk   CHL IP TREATMENT RECOMMENDATION 09/24/2019 Treatment Recommendations Therapy as  outlined in treatment plan below   Prognosis 09/24/2019 Prognosis for Safe Diet Advancement Fair Barriers to Reach Goals Cognitive deficits Barriers/Prognosis Comment -- CHL IP DIET RECOMMENDATION 09/24/2019 SLP Diet Recommendations Dysphagia 2 (Fine chop) solids;Thin liquid Liquid Administration via Cup;Straw Medication Administration Whole meds with liquid Compensations Slow rate;Small sips/bites Postural Changes Seated upright at 90 degrees   CHL IP OTHER RECOMMENDATIONS 09/24/2019 Recommended Consults -- Oral Care Recommendations Oral care BID Other Recommendations --   CHL IP FOLLOW UP RECOMMENDATIONS 09/24/2019 Follow up Recommendations 24 hour supervision/assistance   CHL IP FREQUENCY AND DURATION 09/24/2019 Speech Therapy Frequency (ACUTE ONLY) min 1 x/week Treatment Duration 2 weeks      CHL IP ORAL PHASE 09/24/2019 Oral Phase Impaired Oral - Pudding Teaspoon -- Oral - Pudding Cup -- Oral - Honey Teaspoon -- Oral - Honey Cup -- Oral - Nectar Teaspoon -- Oral - Nectar Cup -- Oral - Nectar Straw -- Oral - Thin Teaspoon WFL Oral - Thin Cup WFL Oral - Thin Straw Premature spillage;Lingual/palatal residue Oral - Puree Weak lingual manipulation;Delayed oral transit Oral - Mech Soft Decreased bolus cohesion;Delayed oral transit;Reduced posterior propulsion;Weak lingual manipulation;Impaired mastication Oral - Regular -- Oral - Multi-Consistency -- Oral - Pill -- Oral Phase - Comment --  CHL IP PHARYNGEAL PHASE 09/24/2019 Pharyngeal Phase Impaired Pharyngeal- Pudding Teaspoon -- Pharyngeal -- Pharyngeal- Pudding Cup -- Pharyngeal -- Pharyngeal- Honey Teaspoon -- Pharyngeal -- Pharyngeal- Honey Cup -- Pharyngeal -- Pharyngeal- Nectar Teaspoon -- Pharyngeal -- Pharyngeal- Nectar Cup -- Pharyngeal -- Pharyngeal- Nectar Straw -- Pharyngeal -- Pharyngeal- Thin Teaspoon Reduced tongue base retraction;Reduced anterior laryngeal mobility;Pharyngeal residue - valleculae Pharyngeal Material does not enter airway Pharyngeal-  Thin Cup Delayed swallow initiation-vallecula;Reduced anterior laryngeal mobility;Reduced tongue base retraction;Pharyngeal residue - valleculae Pharyngeal Material does not enter airway Pharyngeal- Thin Straw Delayed swallow initiation-pyriform sinuses;Reduced anterior laryngeal mobility;Reduced tongue base retraction Pharyngeal Material does not enter airway Pharyngeal- Puree Reduced anterior laryngeal mobility;Reduced tongue base retraction;Pharyngeal residue - valleculae Pharyngeal Material does not enter airway Pharyngeal- Mechanical Soft Delayed swallow initiation-vallecula;Reduced anterior laryngeal mobility;Reduced tongue base retraction;Pharyngeal residue - valleculae Pharyngeal Material does not enter airway Pharyngeal- Regular -- Pharyngeal -- Pharyngeal- Multi-consistency -- Pharyngeal -- Pharyngeal- Pill -- Pharyngeal -- Pharyngeal Comment --  CHL IP CERVICAL ESOPHAGEAL PHASE 09/24/2019 Cervical Esophageal Phase WFL Pudding Teaspoon -- Pudding Cup -- Honey Teaspoon -- Honey Cup -- Nectar Teaspoon -- Nectar Cup -- Nectar Straw -- Thin Teaspoon -- Thin Cup -- Thin Straw -- Puree -- Mechanical Soft -- Regular -- Multi-consistency -- Pill -- Cervical Esophageal Comment -- Colin Mulders M.S., CCC-SLP Acute Rehabilitation Services Office: [phone])  [place] 09/24/2019, 10:36 AM                   Scheduled Meds:  aspirin EC  81 mg Oral Daily   docusate sodium  100 mg Oral BID   donepezil  10 mg Oral QHS   levalbuterol  0.63 mg Nebulization BID   levothyroxine  100 mcg Oral Q0600   sodium chloride flush  3 mL Intravenous Q12H   venlafaxine XR  75 mg Oral Q breakfast   vitamin B-12  1,000 mcg Oral Daily   Continuous Infusions:  sodium chloride     cefTRIAXone (ROCEPHIN)  IV Stopped (09/24/19 1900)   heparin 1,000 Units/hr (09/24/19 1739)     LOS: 6 days     Cordelia Poche, MD [facility] 09/25/2019, 10:00 AM  If 7PM-7AM, please contact  night-coverage [email or web address]

## 2019-09-25 NOTE — Progress Notes (Signed)
   09/25/19 1436  Clinical Encounter Type  Visited With Patient and family together  Visit Type Initial  Referral From Nurse  Consult/Referral To Chaplain  Spiritual Encounters  Spiritual Needs Prayer;Grief support  This chaplain responded to Unit Sec-Shatina's request for spiritual care for Pt. and family after the Pt. transitioned to comfort care.  The Pt. two children were present in the Pt.room during the chaplain's visit.  Through story telling the chaplain listened to the family describe the Pt.'s personality and connection to her community, family, and faith.  The chaplain understands the Pt. is connected to a [employer] in [place].  The Pt. daughter will contact the church.  The chaplain prayed with the Pt. and family before leaving.  F/U spiritual care was offered as needed.

## 2019-09-25 NOTE — Progress Notes (Signed)
PT Cancellation Note  Patient Details Name: Janet Reeves MRN: [id number] DOB: 1929-01-20   Cancelled Treatment:    Reason Eval/Treat Not Completed: (P) Other (comment) Pt has been placed on comfort care. PT signing off  Benjamine Mola B. Migdalia Dk PT, DPT Acute Rehabilitation Services Pager [phone] Office [phone]  [place] 09/25/2019, 1:24 PM

## 2019-09-25 NOTE — Progress Notes (Signed)
Daily Progress Note Patient restless in bed. C/o pain all over. Director of resident care at ALF confirmed palliative service.  Has decompensated and placed on comfort measures. Was given morphine and ativan to enhance comfort. Family is at bedside. Chaplain consulted. Patient is using accesory muscles to breath, is now on a morphine drip with normal saline.

## 2019-09-25 NOTE — Progress Notes (Signed)
  Speech Language Pathology Treatment: Dysphagia  Patient Details Name: Janet Reeves MRN: [id number] DOB: October 05, 1929 Today's Date: 09/25/2019 Time: (P) 1000-(P) 1025 SLP Time Calculation (min) (ACUTE ONLY): (P) 25 min  Assessment / Plan / Recommendation Clinical Impression  Pt seen with am meal quite late in the morning. RN reports she has been very reluctant to accept anything more than a sip or two of ensure. SLP was able to give pt 3 bites of eggs and sausage, two bite of fruit and three sips of ensure prior to adamant refusal. She did need a liquid wash after each bite, which she requested, but otherwise managed the textures well. Current diet is appropriate but intake remains very poor with evidence of FTT. Pt reports no appetite, no enjoyment of PO. She is moaning but cannot verbalize specific need and denies pain. No further SLP intervention is warranted as oral intake is not secondary to dysphagia but a more general functional decline. Will sign off.   HPI HPI: Janet Reeves is a 85 y.o. female with a hx of breast cancer, DM-2 diet controlled, HLD, HTN hypothyroid and lymphoma admitted with SOB x 2 weeks. Dx with acute diastolic heart failure and possible HCAP.       SLP Plan  (P) All goals met       Recommendations  Diet recommendations: (P) Dysphagia 2 (fine chop);Thin liquid Liquids provided via: (P) Cup;Straw Medication Administration: (P) Whole meds with liquid Supervision: (P) Trained caregiver to feed patient Compensations: (P) Slow rate;Small sips/bites;Follow solids with liquid                Oral Care Recommendations: (P) Oral care BID Follow up Recommendations: (P) 24 hour supervision/assistance SLP Visit Diagnosis: (P) Dysphagia, oropharyngeal phase (R13.12) Plan: (P) All goals met       GO               Herbie [place], MA CCC-SLP  Acute Rehabilitation Services Pager [phone] Office [phone]  Lynann Beaver 09/25/2019, 11:41  AM

## 2019-09-25 NOTE — Progress Notes (Signed)
Progress Note  Patient Name: Janet Reeves Date of Encounter: 09/25/2019  Primary Cardiologist: No primary care provider on file.   Subjective   No CP or SHOB.  Inpatient Medications    Scheduled Meds:  aspirin EC  81 mg Oral Daily   docusate sodium  100 mg Oral BID   donepezil  10 mg Oral QHS   levalbuterol  0.63 mg Nebulization BID   levothyroxine  100 mcg Oral Q0600   sodium chloride flush  3 mL Intravenous Q12H   venlafaxine XR  75 mg Oral Q breakfast   vitamin B-12  1,000 mcg Oral Daily   Continuous Infusions:  sodium chloride     cefTRIAXone (ROCEPHIN)  IV Stopped (09/24/19 1900)   heparin 1,000 Units/hr (09/24/19 1739)   PRN Meds: sodium chloride, acetaminophen **OR** acetaminophen, levalbuterol, sodium chloride flush, traMADol   Vital Signs    Vitals:   09/25/19 0056 09/25/19 0303 09/25/19 0721 09/25/19 0824  BP:   129/76   Pulse:      Resp: 19  (!) 23   Temp:  98.3 F (36.8 C) 97.9 F (36.6 C)   TempSrc:  Oral Oral   SpO2:   94% 100%  Weight:  55.6 kg    Height:        Intake/Output Summary (Last 24 hours) at 09/25/2019 1035 Last data filed at 09/24/2019 1600 Gross per 24 hour  Intake 50 ml  Output 50 ml  Net 0 ml   Last 3 Weights 09/25/2019 09/24/2019 09/25/2019  Weight (lbs) 122 lb 9.2 oz 126 lb 5.2 oz 125 lb 14.1 oz  Weight (kg) 55.6 kg 57.3 kg 57.1 kg      Telemetry    AFib, V rates in the 110-120s - Personally Reviewed  ECG      Physical Exam   GEN: No acute distress.  frail Neck: No JVD Cardiac: RRR, 2/6 systolic murmur, no rubs, or gallops.  Respiratory: Clear to auscultation bilaterally. GI: Soft, nontender, non-distended  MS: No edema; No deformity. Neuro:  Nonfocal  Psych: Normal affect   Labs    High Sensitivity Troponin:   Recent Labs  Lab 09/12/2019 1113 10/05/2019 1609  TROPONINIHS 29* 30*      Chemistry Recent Labs  Lab 09/13/2019 1609  09/23/19 0439 09/24/19 0712 09/25/19 0409  NA 138   < >  141 141 144  K 4.3   < > 4.3 4.0 4.0  CL 103   < > 102 102 99  CO2 18*   < > 22 24 25   GLUCOSE 157*   < > 114* 107* 113*  BUN 16   < > 23 36* 40*  CREATININE 1.20*   < > 0.96 1.19* 1.16*  CALCIUM 9.9   < > 10.0 10.0 10.6*  PROT 6.5  --   --   --   --   ALBUMIN 3.3*  --   --   --   --   AST 16  --   --   --   --   ALT 11  --   --   --   --   ALKPHOS 51  --   --   --   --   BILITOT 0.8  --   --   --   --   GFRNONAA 40*   < > 53* 41* 42*  GFRAA 47*   < > >60 47* 49*  ANIONGAP 17*   < > 17* 15 20*   < > =  values in this interval not displayed.     Hematology Recent Labs  Lab 09/23/19 0439 09/24/19 0712 09/25/19 0409  WBC 15.5* 17.8* 26.0*  RBC 4.36 4.09 4.44  HGB 13.2 12.4 13.5  HCT 41.2 37.5 42.2  MCV 94.5 91.7 95.0  MCH 30.3 30.3 30.4  MCHC 32.0 33.1 32.0  RDW 13.6 13.4 13.6  PLT 379 399 387    BNP Recent Labs  Lab 09/09/2019 1609  BNP 107.2*     DDimer  Recent Labs  Lab 09/23/2019 2256  DDIMER 1.61*     Radiology    Dg Chest 1 View  Result Date: 09/24/2019 CLINICAL DATA:  Shortness of breath. EXAM: CHEST  1 VIEW COMPARISON:  09/23/2019 FINDINGS: Contrast noted in the esophagus and also in a large hiatal hernia from a recent barium swallow. The cardiac silhouette, mediastinal and hilar contours are stable. There is tortuosity and calcification of the thoracic aorta. Persistent bilateral patchy nodular airspace disease and small bilateral pleural effusions. IMPRESSION: \Persistent patchy bilateral nodular airspace process and bilateral pleural effusions. Findings likely due to infection. Could not exclude underlying metastatic disease. Electronically Signed   By: Marijo Sanes M.D.   On: 09/24/2019 09:47   Ct Chest Wo Contrast  Result Date: 09/24/2019 CLINICAL DATA:  Shortness of breath, pleural effusion EXAM: CT CHEST WITHOUT CONTRAST TECHNIQUE: Multidetector CT imaging of the chest was performed following the standard protocol without IV contrast. COMPARISON:   07/19/2008 FINDINGS: Cardiovascular: Heart is mildly enlarged. Densely calcified aortic valve and mitral valve. Aorta is tortuous, nonaneurysmal. Moderate aortic calcifications. Coronary artery calcifications. Mediastinum/Nodes: Large hiatal hernia. Small scattered mediastinal lymph nodes. No axillary adenopathy. Cannot assess the hila without IV contrast. Lungs/Pleura: Innumerable bilateral pulmonary nodules and masses throughout the lungs. Index right upper lobe mass measures up to 3.4 cm. Index right middle lobe mass measures up to 2.5 cm. Index left lower lobe lesion measures 2.5 cm. Associated moderate pleural effusions. Upper Abdomen: Imaging into the upper abdomen shows no acute findings. Musculoskeletal: Chest wall soft tissues are unremarkable. Moderate to severe compression fractures noted in the mid thoracic spine, lower thoracic spine and upper lumbar spine. These are age indeterminate. IMPRESSION: Innumerable bilateral pulmonary nodules and masses. Findings highly suspicious or metastases. Moderate bilateral pleural effusions. Cardiomegaly, coronary artery disease. Large hiatal hernia. Multiple compression fractures in the mid and lower thoracic spine and upper lumbar spine. Aortic Atherosclerosis (ICD10-I70.0). Electronically Signed   By: Rolm Baptise M.D.   On: 09/24/2019 21:56   Dg Chest Port 1 View  Result Date: 09/24/2019 CLINICAL DATA:  Acute respiratory failure EXAM: PORTABLE CHEST 1 VIEW COMPARISON:  Same-day radiograph, CT 07/19/2008 FINDINGS: Redemonstrated multifocal interstitial and airspace opacities throughout the lungs. Septal and fissural thickening and cephalized vascularity with bilateral effusions suggests superimposed edema. Air-filled hiatal hernia is again noted. No residual contrast contained within the hernia. Mild cardiomegaly is similar to prior. The aorta is calcified and tortuous. Postsurgical changes are again noted in the right axilla. No acute osseous or soft tissue  abnormality. High-riding appearance of the right humeral head may reflect some rotator cuff insufficiency. Degenerative changes are present in the imaged spine and shoulders. IMPRESSION: 1. Persistent bilateral airspace disease compatible with multifocal pneumonia. Similar to comparison accounting for differences in technique and lung volume. 2. Additional features of superimposed edema with bilateral effusions. 3. Large hiatal hernia. 4.  Aortic Atherosclerosis (ICD10-I70.0). Electronically Signed   By: Lovena Le M.D.   On: 09/24/2019 19:51   Dg  Swallowing Func-speech Pathology  Result Date: 09/24/2019 Objective Swallowing Evaluation: Type of Study: MBS-Modified Barium Swallow Study  Patient Details Name: MAKDA REIFSTECK MRN: [id number] Date of Birth: 24-Aug-1929 Today's Date: 09/24/2019 Time: SLP Start Time (ACUTE ONLY): 0903 -SLP Stop Time (ACUTE ONLY): 0920 SLP Time Calculation (min) (ACUTE ONLY): 17 min Past Medical History: Past Medical History: Diagnosis Date  Breast cancer ([place])   Mastectomy and chemotherapy  Diabetes mellitus ([place])   Diet controlled  GERD (gastroesophageal reflux disease)   Hyperlipidemia   Hypertension   Hypothyroid   Lymphoma ([place])   Chemotherapy Past Surgical History: Past Surgical History: Procedure Laterality Date  APPENDECTOMY    CHOLECYSTECTOMY    MASTECTOMY    TONSILLECTOMY   HPI: KATJA COCKS is a 85 y.o. female with a hx of breast cancer, DM-2 diet controlled, HLD, HTN hypothyroid and lymphoma admitted with SOB x 2 weeks. Dx with acute diastolic heart failure and possible HCAP.  Subjective: Pt was pleasant and cooperative. Assessment / Plan / Recommendation CHL IP CLINICAL IMPRESSIONS 09/24/2019 Clinical Impression Pt presents with minimal oropharyngeal dysphagia with no laryngeal penetration or aspiration observed on today's examination.  Pt was observed with trials of thin liquid, puree, and soft solids.  Oral phase was remarkable for significantly prolonged  mastication of soft solids and prolonged AP transport of puree trials.  Additionally, pt presented with decreased lingual control resulting in premature spillage of thin liquid to the pharynx and reduced lingual strength resulting in trace-mild oral residue.  Pharyngeal phase was remarkable for reduced BOT retraction and reduced hyolaryngeal excursion, resulting in trace vallecular residue.  Recommend diet upgrade to Dysphagia 2 (finely chopped) solids and continuation of thin liquid with the following precautions: 1) Small bites/sips 2) Slow rate of intake 3) Sit upright 90 degrees.  Pt would benefit from full supervision during meals to assist with self feeding and to cue for compensatory strategies. SLP Visit Diagnosis Dysphagia, oropharyngeal phase (R13.12) Attention and concentration deficit following -- Frontal lobe and executive function deficit following -- Impact on safety and function Mild aspiration risk   CHL IP TREATMENT RECOMMENDATION 09/24/2019 Treatment Recommendations Therapy as outlined in treatment plan below   Prognosis 09/24/2019 Prognosis for Safe Diet Advancement Fair Barriers to Reach Goals Cognitive deficits Barriers/Prognosis Comment -- CHL IP DIET RECOMMENDATION 09/24/2019 SLP Diet Recommendations Dysphagia 2 (Fine chop) solids;Thin liquid Liquid Administration via Cup;Straw Medication Administration Whole meds with liquid Compensations Slow rate;Small sips/bites Postural Changes Seated upright at 90 degrees   CHL IP OTHER RECOMMENDATIONS 09/24/2019 Recommended Consults -- Oral Care Recommendations Oral care BID Other Recommendations --   CHL IP FOLLOW UP RECOMMENDATIONS 09/24/2019 Follow up Recommendations 24 hour supervision/assistance   CHL IP FREQUENCY AND DURATION 09/24/2019 Speech Therapy Frequency (ACUTE ONLY) min 1 x/week Treatment Duration 2 weeks      CHL IP ORAL PHASE 09/24/2019 Oral Phase Impaired Oral - Pudding Teaspoon -- Oral - Pudding Cup -- Oral - Honey Teaspoon -- Oral -  Honey Cup -- Oral - Nectar Teaspoon -- Oral - Nectar Cup -- Oral - Nectar Straw -- Oral - Thin Teaspoon WFL Oral - Thin Cup WFL Oral - Thin Straw Premature spillage;Lingual/palatal residue Oral - Puree Weak lingual manipulation;Delayed oral transit Oral - Mech Soft Decreased bolus cohesion;Delayed oral transit;Reduced posterior propulsion;Weak lingual manipulation;Impaired mastication Oral - Regular -- Oral - Multi-Consistency -- Oral - Pill -- Oral Phase - Comment --  CHL IP PHARYNGEAL PHASE 09/24/2019 Pharyngeal Phase Impaired Pharyngeal- Pudding Teaspoon -- Pharyngeal --  Pharyngeal- Pudding Cup -- Pharyngeal -- Pharyngeal- Honey Teaspoon -- Pharyngeal -- Pharyngeal- Honey Cup -- Pharyngeal -- Pharyngeal- Nectar Teaspoon -- Pharyngeal -- Pharyngeal- Nectar Cup -- Pharyngeal -- Pharyngeal- Nectar Straw -- Pharyngeal -- Pharyngeal- Thin Teaspoon Reduced tongue base retraction;Reduced anterior laryngeal mobility;Pharyngeal residue - valleculae Pharyngeal Material does not enter airway Pharyngeal- Thin Cup Delayed swallow initiation-vallecula;Reduced anterior laryngeal mobility;Reduced tongue base retraction;Pharyngeal residue - valleculae Pharyngeal Material does not enter airway Pharyngeal- Thin Straw Delayed swallow initiation-pyriform sinuses;Reduced anterior laryngeal mobility;Reduced tongue base retraction Pharyngeal Material does not enter airway Pharyngeal- Puree Reduced anterior laryngeal mobility;Reduced tongue base retraction;Pharyngeal residue - valleculae Pharyngeal Material does not enter airway Pharyngeal- Mechanical Soft Delayed swallow initiation-vallecula;Reduced anterior laryngeal mobility;Reduced tongue base retraction;Pharyngeal residue - valleculae Pharyngeal Material does not enter airway Pharyngeal- Regular -- Pharyngeal -- Pharyngeal- Multi-consistency -- Pharyngeal -- Pharyngeal- Pill -- Pharyngeal -- Pharyngeal Comment --  CHL IP CERVICAL ESOPHAGEAL PHASE 09/24/2019 Cervical Esophageal Phase  WFL Pudding Teaspoon -- Pudding Cup -- Honey Teaspoon -- Honey Cup -- Nectar Teaspoon -- Nectar Cup -- Nectar Straw -- Thin Teaspoon -- Thin Cup -- Thin Straw -- Puree -- Mechanical Soft -- Regular -- Multi-consistency -- Pill -- Cervical Esophageal Comment -- Colin Mulders M.S., CCC-SLP Acute Rehabilitation Services Office: [phone] [place] 09/24/2019, 10:36 AM               Cardiac Studies   Severe MR by echo  Patient Profile     85 y.o. female AFib, pauses noted.  Assessment & Plan    1) Mitral regurgitation: No indication for TEE.  SHe is not a candidate for invasive treatment of her mitral valve disease. WOuld treat her for sx of volume overload and any discomfort.  RIght now, she does not report any sx.    2) AFib, borderline rate control.  She is asymptomatic.  Would not add AV nodal blocking agents since she has had pauses.    Tried to call the daughter but was unable to reach her.  Will have to discuss longterm anticoagulation with the family.     For questions or updates, please contact [place] Please consult [email or web address] for contact info under        Signed, Larae Grooms, MD  09/25/2019, 10:35 AM

## 2019-09-25 NOTE — Progress Notes (Signed)
ANTICOAGULATION CONSULT NOTE  Pharmacy Consult for heparin Indication: atrial fibrillation   Patient Measurements: Height: 5' 6.5" (168.9 cm) Weight: 122 lb 9.2 oz (55.6 kg) IBW/kg (Calculated) : 60.45 Heparin Dosing Weight: 57.1 kg  Vital Signs: Temp: 98.3 F (36.8 C) (11/17 0303) Temp Source: Oral (11/17 0303) BP: 129/96 (11/16 2349) Pulse Rate: 110 (11/16 1927)  Labs: Recent Labs    09/23/19 0439  09/24/19 0712 09/24/19 1521 09/25/19 0409  HGB 13.2  --  12.4  --  13.5  HCT 41.2  --  37.5  --  42.2  PLT 379  --  399  --  387  HEPARINUNFRC  --    < > 0.46 0.47 0.57  CREATININE 0.96  --  1.19*  --  1.16*   < > = values in this interval not displayed.     Medical History: Past Medical History:  Diagnosis Date  . Breast cancer ([place])    Mastectomy and chemotherapy  . Diabetes mellitus ([place])    Diet controlled  . GERD (gastroesophageal reflux disease)   . Hyperlipidemia   . Hypertension   . Hypothyroid   . Lymphoma ([place])    Chemotherapy    Medications:  Scheduled:  . aspirin EC  81 mg Oral Daily  . docusate sodium  100 mg Oral BID  . donepezil  10 mg Oral QHS  . furosemide  40 mg Intravenous BID  . levalbuterol  0.63 mg Nebulization BID  . levothyroxine  100 mcg Oral Q0600  . sodium chloride flush  3 mL Intravenous Q12H  . venlafaxine XR  75 mg Oral Q breakfast  . vitamin B-12  1,000 mcg Oral Daily   Infusions:  . sodium chloride    . cefTRIAXone (ROCEPHIN)  IV Stopped (09/24/19 1900)  . heparin 1,000 Units/hr (09/24/19 1739)    Assessment: 89yoF admitted for SOB with concerns for acute CHF vs. pneumonia. Found to have tachy-brady syndrome with AFib and RVR. CHA2DS2VASc of 5. No AC PTA. Pharmacy consulted for heparin dosing.  Heparin level is therapeutic. CBC is within normal limits.  No bleeding reported.    Goal of Therapy:  Heparin level 0.3-0.7 units/ml Monitor platelets by anticoagulation protocol: Yes    Plan:  Conitnue heparin at  1000 units/hr Recheck heparin level in 8 hours to confirm.  Daily heparin level and CBC while on heparin F/u plan for oral anticoagulation   Harvel Quale 09/25/2019 7:16 AM

## 2019-09-30 LAB — CULTURE, BLOOD (ROUTINE X 2)
Culture: NO GROWTH
Culture: NO GROWTH
Special Requests: ADEQUATE
Special Requests: ADEQUATE

## 2019-10-09 NOTE — Death Summary Note (Signed)
DEATH SUMMARY   Patient Details  Name: Janet Reeves MRN: [id number] DOB: 1929/01/08  Admission/Discharge Information   Admit Date:  10-15-2019  Date of Death: Date of Death: 22-Oct-2019  Time of Death: Time of Death: 02/16/23  Length of Stay: 7  Referring Physician: Jacklyn Shell, FNP   Reason(s) for Hospitalization  Shortness of breath  Diagnoses  Preliminary cause of death:  Secondary Diagnoses (including complications and co-morbidities):  Active Problems:   Essential hypertension   Hypothyroidism   Breast cancer (HCC)   Healthcare-associated pneumonia   Acute CHF (congestive heart failure) [facility])   Pneumonia   Brief Hospital Course (including significant findings, care, treatment, and services provided and events leading to death)  Janet Reeves is a 85 y.o. year old female who female with a history of hypertension, hypothyroidism, breast cancer, dementia. Patient presented secondary to shortness of breath with initial concern for pneumonia vs heart failure.   Acute diastolic heart failure Transthoracic Echocardiogram not suggestive of systolic heart failure. No mention of diastolic function. With elevated BNP and chest x-ray significant for infiltrates and cardiomegaly, will assume she has some diastolic dysfunction. Most recent chest x-ray more suggestive for evidence of heart failure. Patient aggressively diuresed with IV lasix with minimal improvement to respiratory status.   HCAP ruled out Diagnosed on admission. Started on empiric Ceftriaxone and azithromycin. Procalcitonin undetectable. Wheezing on exam. Leukocytosis initially likely driven by steroids, now with significant increase. CT chest not suggestive of pneumonia but rather "innumerable" pulmonary nodules and masses concerning for metastatic cancer. As mentioned below, patient transitioned to comfort care.  Acute respiratory failure Secondary to above. Chest CT suggests metastatic cancer and is significant for  bilateral pleural effusions.   Pleural effusions Unsure if related to heart failure vs metastatic etiology. Either, they did not appear to be responding to Lasix therapy. Discussed goals of care with daughter and patient (limited at the time secondary to worsening mental status). Decision made to transition to comfort care rather than to pursue further invasive treatments.  Paroxysmal Atrial fibrillation Tachybrady syndrome TSH obtained and was low. Confirmed Synthroid dose of 112 mcg from ALF. EP consulted by cardiology. Still with episodes of significant tachycardia. EP recommending conservative management after discussion with patient. Heparin started for atrial fibrillation.  Essential hypertension Well controlled. Not on medication  Hypothyroidism TSH low. Patient receiving Synthroid 112 mcg at ALF. Per review, dose increased from 100 mcg to 112 mcg on 05/19/2019. Most recent TSH from ALF was 7.51 on 07/05/2019. Numbers are slightly difficult to interpret, but since she has a low TSH at present with tachycardic episodes, Synthroid dose decreased to 110 mcg.  Moderate aortic stenosis New diagnosis. Cardiology consulted for above  Moderate/severe mitral valve regurgitation New diagnosis seen on Transthoracic Echocardiogram. In setting of degenerative heart valve. Cardiology consulted for above.  History of breast cancer Noted  Dementia Continued Aricept  Abdominal pain Patient reports generalized abdominal pain that improved with bowel movement. Abdominal x-ray unremarkable.  Positive blood culture 2/4 positive for GPC. Final result is coagulase negative staph. Likely contamination. Patient with increasing WBC and plan would have been to repeat blood cultures. No fevers.     Pertinent Labs and Studies  Significant Diagnostic Studies Dg Chest 1 View  Result Date: 09/24/2019 CLINICAL DATA:  Shortness of breath. EXAM: CHEST  1 VIEW COMPARISON:  09/23/2019 FINDINGS:  Contrast noted in the esophagus and also in a large hiatal hernia from a recent barium swallow. The cardiac silhouette, mediastinal and  hilar contours are stable. There is tortuosity and calcification of the thoracic aorta. Persistent bilateral patchy nodular airspace disease and small bilateral pleural effusions. IMPRESSION: \Persistent patchy bilateral nodular airspace process and bilateral pleural effusions. Findings likely due to infection. Could not exclude underlying metastatic disease. Electronically Signed   By: Marijo Sanes M.D.   On: 09/24/2019 09:47   Dg Chest 2 View  Result Date: 09/20/2019 CLINICAL DATA:  Pneumonia.  CHF. EXAM: CHEST - 2 VIEW COMPARISON:  07/20/2019.  02/27/2013. FINDINGS: Stable cardiomegaly. Diffuse multifocal bilateral pulmonary infiltrates again noted. Infiltrates are slightly increased in the upper lobes day's exam small left pleural effusion. Prominence sliding hiatal hernia again noted. Stable lower thoracic vertebral body compression fracture. Surgical clips right axilla. IMPRESSION: 1. Diffuse multifocal bilateral pulmonary infiltrates again noted pulmonary infiltrates have slightly increased in the upper lobes on today's exam. 2. Stable cardiomegaly. 3. Prominent hiatal hernia again noted. Electronically Signed   By: Marcello Moores  Register   On: 09/20/2019 14:08   Ct Chest Wo Contrast  Result Date: 09/24/2019 CLINICAL DATA:  Shortness of breath, pleural effusion EXAM: CT CHEST WITHOUT CONTRAST TECHNIQUE: Multidetector CT imaging of the chest was performed following the standard protocol without IV contrast. COMPARISON:  07/19/2008 FINDINGS: Cardiovascular: Heart is mildly enlarged. Densely calcified aortic valve and mitral valve. Aorta is tortuous, nonaneurysmal. Moderate aortic calcifications. Coronary artery calcifications. Mediastinum/Nodes: Large hiatal hernia. Small scattered mediastinal lymph nodes. No axillary adenopathy. Cannot assess the hila without IV contrast.  Lungs/Pleura: Innumerable bilateral pulmonary nodules and masses throughout the lungs. Index right upper lobe mass measures up to 3.4 cm. Index right middle lobe mass measures up to 2.5 cm. Index left lower lobe lesion measures 2.5 cm. Associated moderate pleural effusions. Upper Abdomen: Imaging into the upper abdomen shows no acute findings. Musculoskeletal: Chest wall soft tissues are unremarkable. Moderate to severe compression fractures noted in the mid thoracic spine, lower thoracic spine and upper lumbar spine. These are age indeterminate. IMPRESSION: Innumerable bilateral pulmonary nodules and masses. Findings highly suspicious or metastases. Moderate bilateral pleural effusions. Cardiomegaly, coronary artery disease. Large hiatal hernia. Multiple compression fractures in the mid and lower thoracic spine and upper lumbar spine. Aortic Atherosclerosis (ICD10-I70.0). Electronically Signed   By: Rolm Baptise M.D.   On: 09/24/2019 21:56   US Renal  Result Date: 09/25/2019 CLINICAL DATA:  Acute kidney injury with decreased urine output EXAM: RENAL / URINARY TRACT ULTRASOUND COMPLETE COMPARISON:  CT chest 09/24/2019 FINDINGS: Right Kidney: Renal measurements: 8.5 x 3.9 x 4.6 cm = volume: 79 mL . Echogenicity within normal limits. No mass or hydronephrosis visualized. Left Kidney: Renal measurements: 8.8 x 4.3 x 4.1 cm = volume: 80 mL. Echogenicity within normal limits. No mass or hydronephrosis visualized. Bladder: Appears normal for degree of bladder distention. Other: Bilateral pleural effusions, large on the left and smaller on the right. IMPRESSION: Small volume kidneys.  No obstruction or mass. Bilateral pleural effusions left greater than right Electronically Signed   By: Franchot Gallo M.D.   On: 09/25/2019 10:33   Dg Chest Port 1 View  Result Date: 09/24/2019 CLINICAL DATA:  Acute respiratory failure EXAM: PORTABLE CHEST 1 VIEW COMPARISON:  Same-day radiograph, CT 07/19/2008 FINDINGS:  Redemonstrated multifocal interstitial and airspace opacities throughout the lungs. Septal and fissural thickening and cephalized vascularity with bilateral effusions suggests superimposed edema. Air-filled hiatal hernia is again noted. No residual contrast contained within the hernia. Mild cardiomegaly is similar to prior. The aorta is calcified and tortuous. Postsurgical changes are  again noted in the right axilla. No acute osseous or soft tissue abnormality. High-riding appearance of the right humeral head may reflect some rotator cuff insufficiency. Degenerative changes are present in the imaged spine and shoulders. IMPRESSION: 1. Persistent bilateral airspace disease compatible with multifocal pneumonia. Similar to comparison accounting for differences in technique and lung volume. 2. Additional features of superimposed edema with bilateral effusions. 3. Large hiatal hernia. 4.  Aortic Atherosclerosis (ICD10-I70.0). Electronically Signed   By: Lovena Le M.D.   On: 09/24/2019 19:51   Dg Chest Port 1 View  Result Date: 09/23/2019 CLINICAL DATA:  Respiratory failure. Diabetes and hypertension. EXAM: PORTABLE CHEST 1 VIEW COMPARISON:  09/22/2019 FINDINGS: Decreased lung volumes. Bilateral pleural effusion and pulmonary edema is noted. Multifocal bilateral airspace opacities throughout both lungs again noted. When compared with the previous exam there is been slight interval improvement in aeration to the right upper lobe. IMPRESSION: 1. Slight improvement in aeration to the right upper lobe. 2. Persistent bilateral airspace opacities compatible with multifocal pneumonia. 3. Persistent bilateral pleural effusions and pulmonary edema compatible with congestive heart failure. Electronically Signed   By: Kerby Moors M.D.   On: 09/23/2019 08:45   Dg Chest Port 1 View  Result Date: 09/22/2019 CLINICAL DATA:  Respiratory distress.  Aspiration into the airway. EXAM: PORTABLE CHEST 1 VIEW COMPARISON:   09/20/2019 FINDINGS: Stable cardiac enlargement. Bilateral pleural effusions and interstitial edema is unchanged. Multifocal bilateral airspace opacities are again noted particularly in the right upper lobe and left base. These are not significantly changed in the interval. IMPRESSION: 1. No change in bilateral airspace opacities and changes of CHF. Electronically Signed   By: Kerby Moors M.D.   On: 09/22/2019 06:44   Dg Chest Portable 1 View  Result Date: 09/10/2019 CLINICAL DATA:  Hypoxia EXAM: PORTABLE CHEST 1 VIEW COMPARISON:  08/20/2014 FINDINGS: The heart size is enlarged. There are scattered bilateral airspace opacities, greatest within the perihilar distribution. There is a left lower lobe airspace opacity with a probable left-sided pleural effusion. There is no pneumothorax. There is no acute osseous abnormality. IMPRESSION: 1. Scattered bilateral airspace opacities, greatest within a perihilar distribution. Differential considerations include pulmonary edema or an atypical infectious process. Follow-up to resolution is recommended as an underlying pulmonary nodule cannot be excluded. 2. More focal consolidation is noted at the left lung base. 3. Probable small left-sided pleural effusion. 4. Cardiomegaly. Electronically Signed   By: Constance Holster M.D.   On: 09/18/2019 15:15   Dg Abd Portable 1v  Result Date: 09/21/2019 CLINICAL DATA:  Diarrhea, abdominal pain. EXAM: PORTABLE ABDOMEN - 1 VIEW COMPARISON:  January 24, 2010. FINDINGS: The bowel gas pattern is normal. No radio-opaque calculi or other significant radiographic abnormality are seen. IMPRESSION: No evidence of bowel obstruction or ileus. Electronically Signed   By: Marijo Conception M.D.   On: 09/21/2019 12:31   Dg Swallowing Func-speech Pathology  Result Date: 09/24/2019 Objective Swallowing Evaluation: Type of Study: MBS-Modified Barium Swallow Study  Patient Details Name: Janet Reeves MRN: [id number] Date of Birth: [birth year]  Today's Date: 09/24/2019 Time: SLP Start Time (ACUTE ONLY): [id number] -SLP Stop Time (ACUTE ONLY): 0920 SLP Time Calculation (min) (ACUTE ONLY): 17 min Past Medical History: Past Medical History: Diagnosis Date . Breast cancer ([place])   Mastectomy and chemotherapy . Diabetes mellitus ([place])   Diet controlled . GERD (gastroesophageal reflux disease)  . Hyperlipidemia  . Hypertension  . Hypothyroid  . Lymphoma [facility])   Chemotherapy Past Surgical History:  Past Surgical History: Procedure Laterality Date . APPENDECTOMY   . CHOLECYSTECTOMY   . MASTECTOMY   . TONSILLECTOMY   HPI: STEFFANIE CONDOR is a 85 y.o. female with a hx of breast cancer, DM-2 diet controlled, HLD, HTN hypothyroid and lymphoma admitted with SOB x 2 weeks. Dx with acute diastolic heart failure and possible HCAP.  Subjective: Pt was pleasant and cooperative. Assessment / Plan / Recommendation CHL IP CLINICAL IMPRESSIONS 09/24/2019 Clinical Impression Pt presents with minimal oropharyngeal dysphagia with no laryngeal penetration or aspiration observed on today's examination.  Pt was observed with trials of thin liquid, puree, and soft solids.  Oral phase was remarkable for significantly prolonged mastication of soft solids and prolonged AP transport of puree trials.  Additionally, pt presented with decreased lingual control resulting in premature spillage of thin liquid to the pharynx and reduced lingual strength resulting in trace-mild oral residue.  Pharyngeal phase was remarkable for reduced BOT retraction and reduced hyolaryngeal excursion, resulting in trace vallecular residue.  Recommend diet upgrade to Dysphagia 2 (finely chopped) solids and continuation of thin liquid with the following precautions: 1) Small bites/sips 2) Slow rate of intake 3) Sit upright 90 degrees.  Pt would benefit from full supervision during meals to assist with self feeding and to cue for compensatory strategies. SLP Visit Diagnosis Dysphagia, oropharyngeal phase (R13.12) Attention  and concentration deficit following -- Frontal lobe and executive function deficit following -- Impact on safety and function Mild aspiration risk   CHL IP TREATMENT RECOMMENDATION 09/24/2019 Treatment Recommendations Therapy as outlined in treatment plan below   Prognosis 09/24/2019 Prognosis for Safe Diet Advancement Fair Barriers to Reach Goals Cognitive deficits Barriers/Prognosis Comment -- CHL IP DIET RECOMMENDATION 09/24/2019 SLP Diet Recommendations Dysphagia 2 (Fine chop) solids;Thin liquid Liquid Administration via Cup;Straw Medication Administration Whole meds with liquid Compensations Slow rate;Small sips/bites Postural Changes Seated upright at 90 degrees   CHL IP OTHER RECOMMENDATIONS 09/24/2019 Recommended Consults -- Oral Care Recommendations Oral care BID Other Recommendations --   CHL IP FOLLOW UP RECOMMENDATIONS 09/24/2019 Follow up Recommendations 24 hour supervision/assistance   CHL IP FREQUENCY AND DURATION 09/24/2019 Speech Therapy Frequency (ACUTE ONLY) min 1 x/week Treatment Duration 2 weeks      CHL IP ORAL PHASE 09/24/2019 Oral Phase Impaired Oral - Pudding Teaspoon -- Oral - Pudding Cup -- Oral - Honey Teaspoon -- Oral - Honey Cup -- Oral - Nectar Teaspoon -- Oral - Nectar Cup -- Oral - Nectar Straw -- Oral - Thin Teaspoon WFL Oral - Thin Cup WFL Oral - Thin Straw Premature spillage;Lingual/palatal residue Oral - Puree Weak lingual manipulation;Delayed oral transit Oral - Mech Soft Decreased bolus cohesion;Delayed oral transit;Reduced posterior propulsion;Weak lingual manipulation;Impaired mastication Oral - Regular -- Oral - Multi-Consistency -- Oral - Pill -- Oral Phase - Comment --  CHL IP PHARYNGEAL PHASE 09/24/2019 Pharyngeal Phase Impaired Pharyngeal- Pudding Teaspoon -- Pharyngeal -- Pharyngeal- Pudding Cup -- Pharyngeal -- Pharyngeal- Honey Teaspoon -- Pharyngeal -- Pharyngeal- Honey Cup -- Pharyngeal -- Pharyngeal- Nectar Teaspoon -- Pharyngeal -- Pharyngeal- Nectar Cup --  Pharyngeal -- Pharyngeal- Nectar Straw -- Pharyngeal -- Pharyngeal- Thin Teaspoon Reduced tongue base retraction;Reduced anterior laryngeal mobility;Pharyngeal residue - valleculae Pharyngeal Material does not enter airway Pharyngeal- Thin Cup Delayed swallow initiation-vallecula;Reduced anterior laryngeal mobility;Reduced tongue base retraction;Pharyngeal residue - valleculae Pharyngeal Material does not enter airway Pharyngeal- Thin Straw Delayed swallow initiation-pyriform sinuses;Reduced anterior laryngeal mobility;Reduced tongue base retraction Pharyngeal Material does not enter airway Pharyngeal- Puree Reduced anterior laryngeal mobility;Reduced tongue base  retraction;Pharyngeal residue - valleculae Pharyngeal Material does not enter airway Pharyngeal- Mechanical Soft Delayed swallow initiation-vallecula;Reduced anterior laryngeal mobility;Reduced tongue base retraction;Pharyngeal residue - valleculae Pharyngeal Material does not enter airway Pharyngeal- Regular -- Pharyngeal -- Pharyngeal- Multi-consistency -- Pharyngeal -- Pharyngeal- Pill -- Pharyngeal -- Pharyngeal Comment --  CHL IP CERVICAL ESOPHAGEAL PHASE 09/24/2019 Cervical Esophageal Phase WFL Pudding Teaspoon -- Pudding Cup -- Honey Teaspoon -- Honey Cup -- Nectar Teaspoon -- Nectar Cup -- Nectar Straw -- Thin Teaspoon -- Thin Cup -- Thin Straw -- Puree -- Mechanical Soft -- Regular -- Multi-consistency -- Pill -- Cervical Esophageal Comment -- Colin Mulders M.S., CCC-SLP Acute Rehabilitation Services Office: [phone] [name] [facility] 09/24/2019, 10:36 AM               Microbiology Recent Results (from the past 240 hour(s))  Culture, blood (routine x 2)     Status: None   Collection Time: 09/25/19  9:23 AM   Specimen: BLOOD  Result Value Ref Range Status   Specimen Description BLOOD RIGHT ANTECUBITAL  Final   Special Requests   Final    BOTTLES DRAWN AEROBIC ONLY Blood Culture adequate volume   Culture   Final    NO GROWTH 5  DAYS Performed at [facility], [address], [place], [place] [address]    Report Status 09/30/2019 FINAL  Final  Culture, blood (routine x 2)     Status: None   Collection Time: 09/25/19  9:23 AM   Specimen: BLOOD  Result Value Ref Range Status   Specimen Description BLOOD RIGHT ANTECUBITAL  Final   Special Requests   Final    BOTTLES DRAWN AEROBIC ONLY Blood Culture adequate volume   Culture   Final    NO GROWTH 5 DAYS Performed at [facility], [place] [address] [place], [place] [address]    Report Status 09/30/2019 FINAL  Final    Lab Basic Metabolic Panel: Recent Labs  Lab 09/25/19 0409  NA 144  K 4.0  CL 99  CO2 25  GLUCOSE 113*  BUN 40*  CREATININE 1.16*  CALCIUM 10.6*   Liver Function Tests: No results for input(s): AST, ALT, ALKPHOS, BILITOT, PROT, ALBUMIN in the last 168 hours. No results for input(s): LIPASE, AMYLASE in the last 168 hours. No results for input(s): AMMONIA in the last 168 hours. CBC: Recent Labs  Lab 09/25/19 0409  WBC 23.2*  26.0*  NEUTROABS 20.5*  HGB 13.9  13.5  HCT 43.6  42.2  MCV 95.8  95.0  PLT 426*  387   Cardiac Enzymes: No results for input(s): CKTOTAL, CKMB, CKMBINDEX, TROPONINI in the last 168 hours. Sepsis Labs: Recent Labs  Lab 09/25/19 0409  WBC 23.2*  26.0*    Procedures/Operations   11/12: Transthoracic Echocardiogram IMPRESSIONS   1. There is moderate to severe mitral regurgitation present (likely severe). The mitral valve annulus and PMVL are heavily calcified consistent with degenerative mitral valve disease. The regurgitation is poorly interrogated on this study. The  regurgitant jet is posteriorly directed, reaching the pulmonary veins. Would consider a TEE for better quantification. 2. A mild to moderate degree of aortic stenosis is present. The Vmax is 2.1 m/s, and mean gradient 10.3 mmHG. AVA by continuity is 1.20 cm2. The DI is 0.47. The peak velocity and gradients are  underestimated due to low SV (SV=42 cc; SVi=25 cc/m2). I  suspect at least moderate AS is present. 3. Left ventricular ejection fraction, by visual estimation, is  60 to 65%. The left ventricle has normal function. There is mildly increased left ventricular hypertrophy. 4. Left ventricular diastolic function could not be evaluated. 5. Global right ventricle has normal systolic function.The right ventricular size is normal. No increase in right ventricular wall thickness. 6. Left atrial size was severely dilated. 7. Right atrial size was normal. 8. Presence of pericardial fat pad. 9. Trivial pericardial effusion is present. 10. Moderate calcification of the mitral valve leaflet(s). 11. Moderate thickening of the mitral valve leaflet(s). 12. Moderate to severe mitral annular calcification. 13. The mitral valve is degenerative. Moderate to severe mitral valve regurgitation. 14. The tricuspid valve is grossly normal. Tricuspid valve regurgitation is trivial. 15. The aortic valve is tricuspid. Aortic valve regurgitation is not visualized. Mild to moderate aortic valve stenosis. 16. There is Severe calcifcation of the aortic valve. 17. There is Severely thickening of the aortic valve. 18. The pulmonic valve was grossly normal. Pulmonic valve regurgitation is not visualized. 19. Normal pulmonary artery systolic pressure. 20. The tricuspid regurgitant velocity is 2.51 m/s, and with an assumed right atrial pressure of 8 mmHg, the estimated right ventricular systolic pressure is normal at 33.2 mmHg. 21. The inferior vena cava is normal in size with <50% respiratory variability, suggesting right atrial pressure of 8 mmHg. 22. Small left ventricular internal cavity size.    Cordelia Poche 10/01/2019, 6:40 PM

## 2019-10-09 NOTE — Progress Notes (Signed)
Pt expired at [place] pronounced by 2 RNs. She was a DNR. Death certificate completed and given to RN.  KJKG, NP [facility]

## 2019-10-09 DEATH — deceased
